# Patient Record
Sex: Female | Born: 1980 | Race: White | Hispanic: No | Marital: Married | Smoking: Former smoker
Health system: Southern US, Community
[De-identification: ages and names within clinical notes are randomized; demographics above are authoritative.]

## PROBLEM LIST (undated history)

## (undated) DIAGNOSIS — N939 Abnormal uterine and vaginal bleeding, unspecified: Secondary | ICD-10-CM

## (undated) DIAGNOSIS — R790 Abnormal level of blood mineral: Secondary | ICD-10-CM

## (undated) DIAGNOSIS — U071 COVID-19: Secondary | ICD-10-CM

## (undated) DIAGNOSIS — A64 Unspecified sexually transmitted disease: Secondary | ICD-10-CM

## (undated) DIAGNOSIS — N83202 Unspecified ovarian cyst, left side: Secondary | ICD-10-CM

## (undated) DIAGNOSIS — K219 Gastro-esophageal reflux disease without esophagitis: Secondary | ICD-10-CM

## (undated) DIAGNOSIS — H9311 Tinnitus, right ear: Secondary | ICD-10-CM

## (undated) DIAGNOSIS — D649 Anemia, unspecified: Secondary | ICD-10-CM

## (undated) DIAGNOSIS — N809 Endometriosis, unspecified: Secondary | ICD-10-CM

## (undated) DIAGNOSIS — G43909 Migraine, unspecified, not intractable, without status migrainosus: Secondary | ICD-10-CM

## (undated) DIAGNOSIS — H9190 Unspecified hearing loss, unspecified ear: Secondary | ICD-10-CM

## (undated) DIAGNOSIS — N9489 Other specified conditions associated with female genital organs and menstrual cycle: Secondary | ICD-10-CM

## (undated) DIAGNOSIS — R42 Dizziness and giddiness: Secondary | ICD-10-CM

## (undated) DIAGNOSIS — R740 Nonspecific elevation of levels of transaminase and lactic acid dehydrogenase [LDH]: Secondary | ICD-10-CM

## (undated) HISTORY — DX: Gastro-esophageal reflux disease without esophagitis: K21.9

## (undated) HISTORY — DX: Unspecified hearing loss, unspecified ear: H91.90

## (undated) HISTORY — DX: Abnormal level of blood mineral: R79.0

## (undated) HISTORY — DX: Anemia, unspecified: D64.9

## (undated) HISTORY — DX: Unspecified sexually transmitted disease: A64

## (undated) HISTORY — DX: Nonspecific elevation of levels of transaminase and lactic acid dehydrogenase (ldh): R74.0

## (undated) MED FILL — Iron Sucrose Inj 20 MG/ML (Fe Equiv): INTRAVENOUS | Qty: 10 | Status: AC

## (undated) MED FILL — Famotidine Inj 200 MG/20ML: INTRAVENOUS | Qty: 4 | Status: AC

---
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
## 2002-04-17 ENCOUNTER — Other Ambulatory Visit: Admission: RE | Admit: 2002-04-17 | Discharge: 2002-04-17 | Payer: Self-pay | Admitting: Obstetrics and Gynecology

## 2002-08-15 HISTORY — PX: CHOLECYSTECTOMY: SHX55

## 2002-08-15 HISTORY — PX: APPENDECTOMY: SHX54

## 2003-05-16 ENCOUNTER — Other Ambulatory Visit: Admission: RE | Admit: 2003-05-16 | Discharge: 2003-05-16 | Payer: Self-pay | Admitting: Obstetrics and Gynecology

## 2004-05-03 ENCOUNTER — Other Ambulatory Visit: Admission: RE | Admit: 2004-05-03 | Discharge: 2004-05-03 | Payer: Self-pay | Admitting: Obstetrics and Gynecology

## 2005-07-11 ENCOUNTER — Other Ambulatory Visit: Admission: RE | Admit: 2005-07-11 | Discharge: 2005-07-11 | Payer: Self-pay | Admitting: Obstetrics and Gynecology

## 2009-08-15 HISTORY — PX: AUGMENTATION MAMMAPLASTY: SUR837

## 2009-09-10 ENCOUNTER — Ambulatory Visit (HOSPITAL_COMMUNITY): Admission: RE | Admit: 2009-09-10 | Discharge: 2009-09-10 | Payer: Self-pay | Admitting: Radiology

## 2010-03-28 IMAGING — US US TRANSVAGINAL NON-OB
1 series · 13 of 25 positions shown · non-contrast
Comparison: None.

CLINICAL DATA: Left lower quadrant pain.  History of left ovarian
cyst. LMP 08/17/2009

TRANSABDOMINAL AND TRANSVAGINAL ULTRASOUND OF PELVIS
TECHNIQUE: Both transabdominal and transvaginal ultrasound
examinations of the pelvis were performed including evaluation of
the uterus, ovaries, adnexal regions, and pelvic cul-de-sac.

[Series 1: us transvaginal non-ob · 58 acquisitions, 13 frames shown]
[im 1/58]
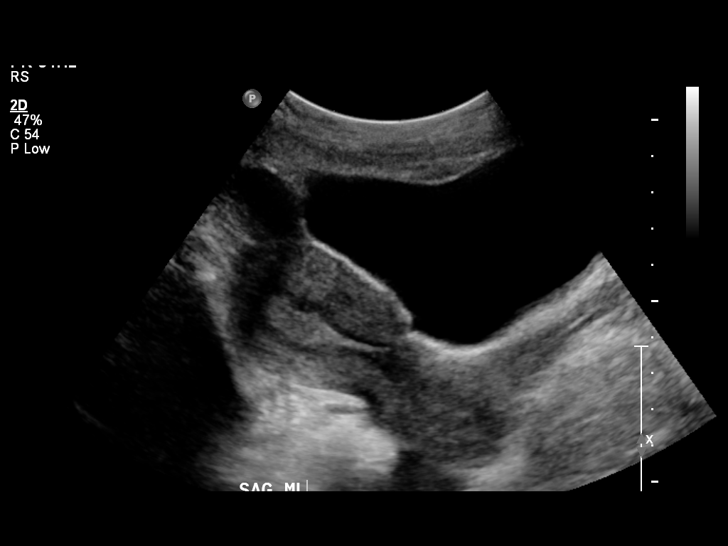
[im 5/58]
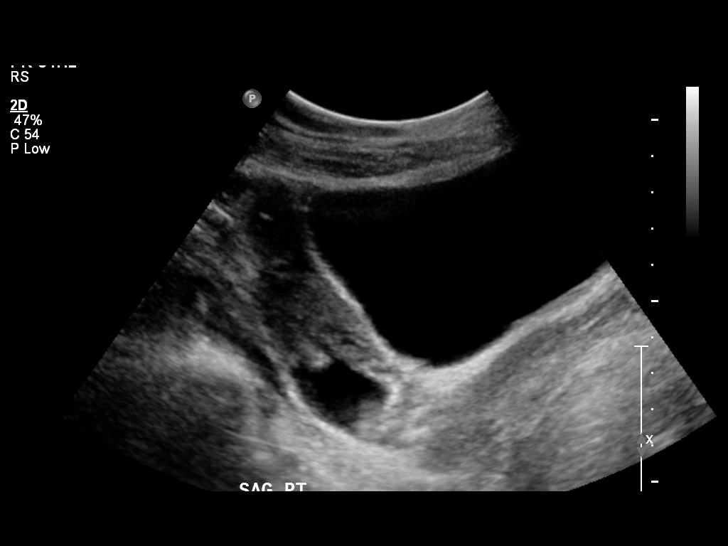
[im 10/58]
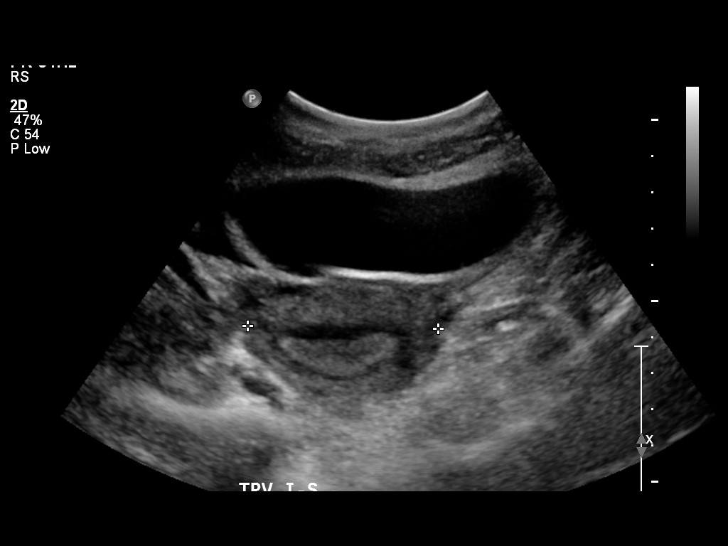
[im 15/58]
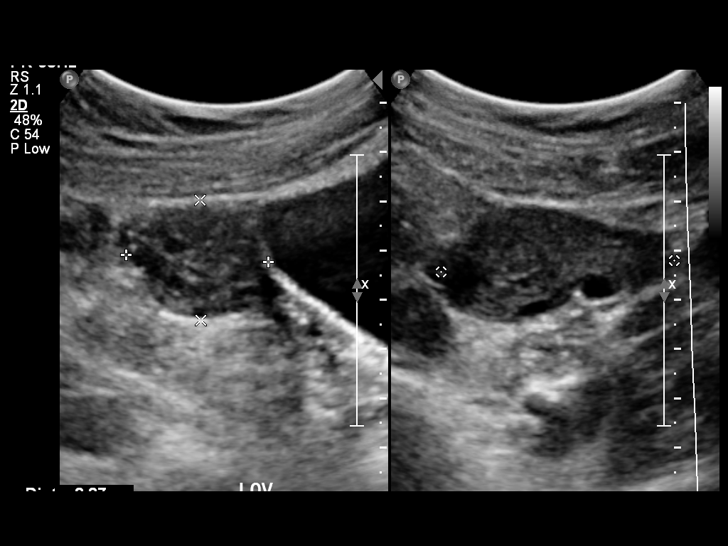
[im 20/58]
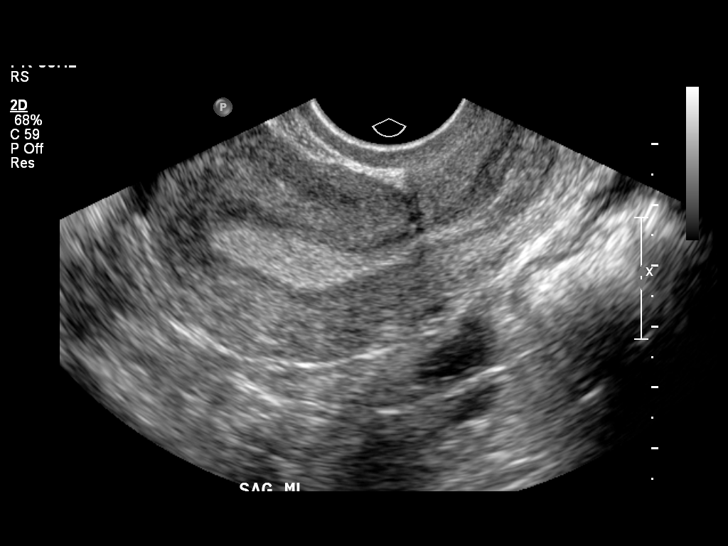
[im 24/58]
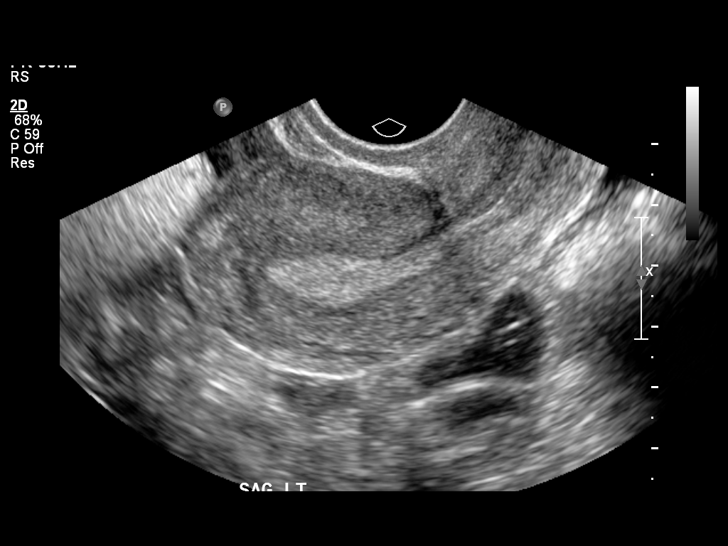
[im 29/58]
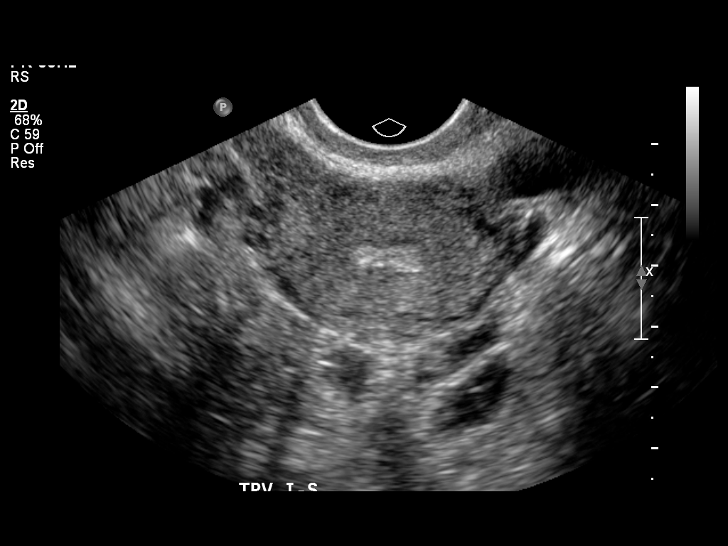
[im 34/58]
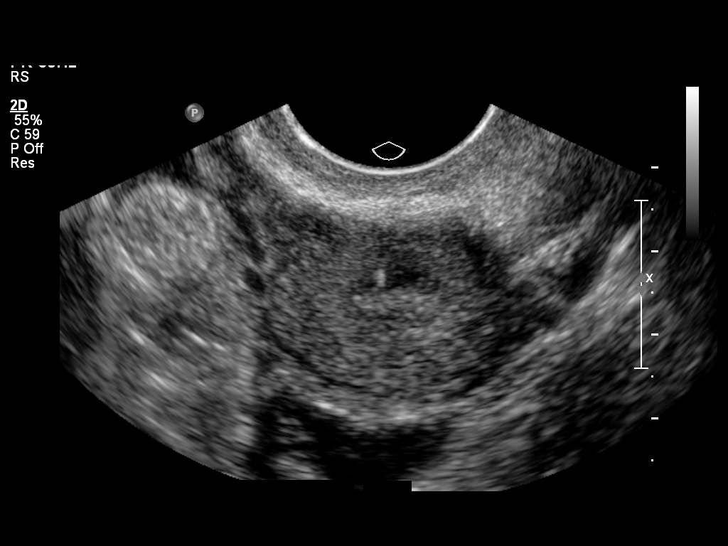
[im 39/58]
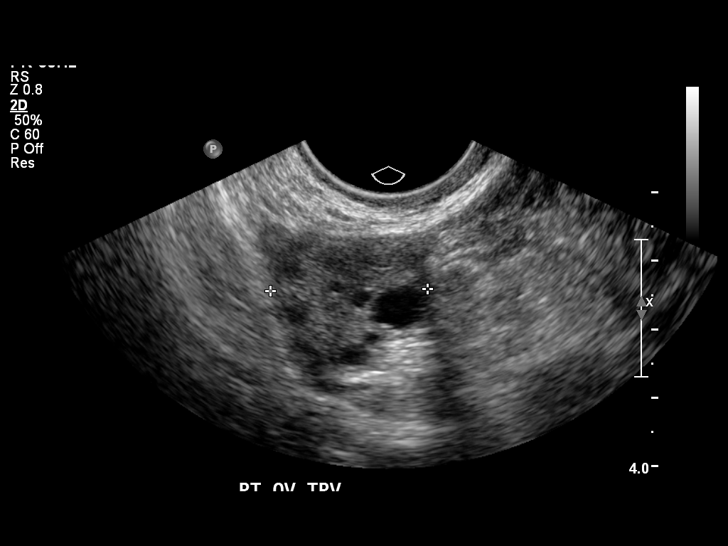
[im 43/58]
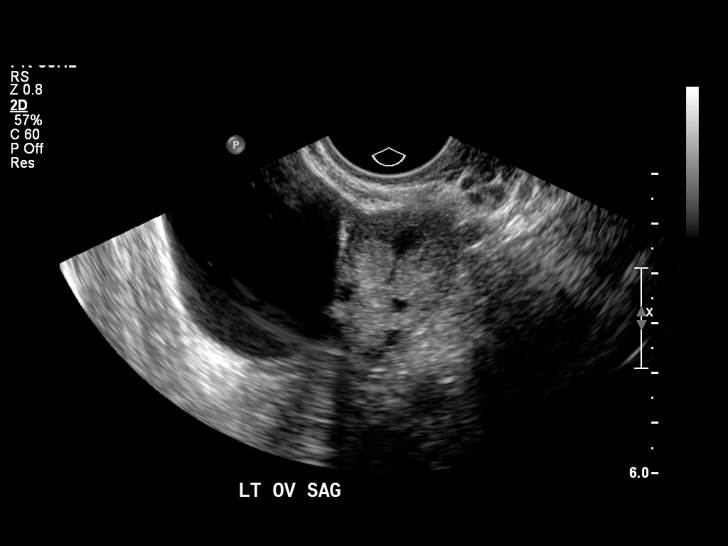
[im 48/58]
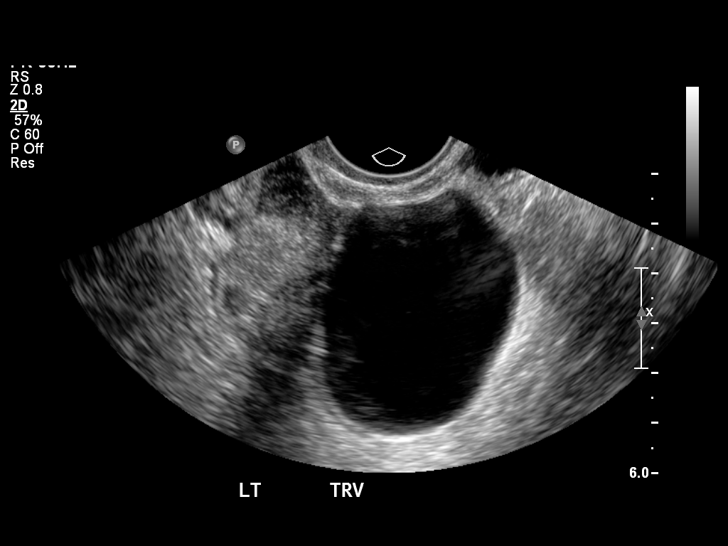
[im 53/58]
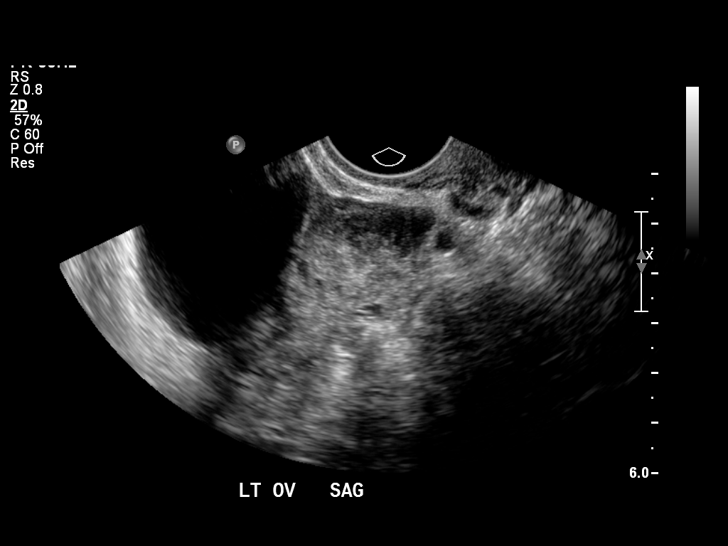
[im 58/58]
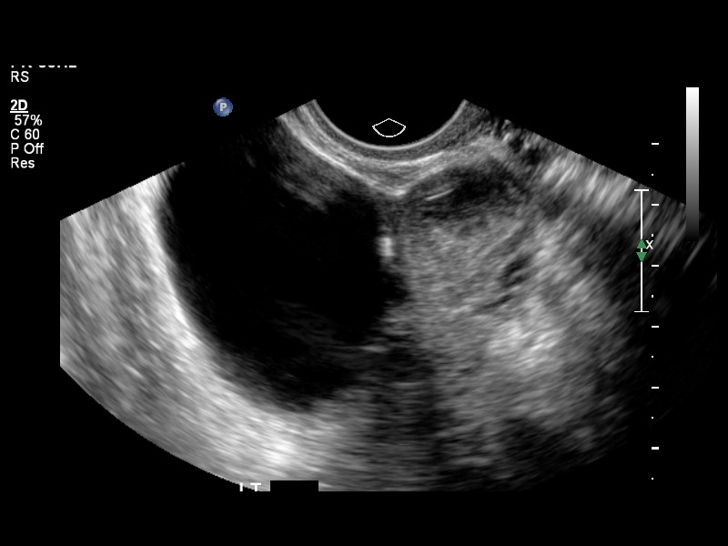

[13 of 25 positions shown; findings below may reference images not displayed]

FINDINGS: Uterus the uterus demonstrates a sagittal length of 8.8 cm, an AP
width of 3.8 cm and a transverse width of 5.2 cm.  A homogeneous
uterine myometrium is seen

Endometrium the endometrium is homogeneously echogenic with an AP
width of 9.7 mm.  No areas of focal thickening or inhomogeneity are
noted.

Right Ovary measures 2.3 x 2.8 x 2.4 cm and has a normal appearance

Left Ovary measures 4.9 x 2.5 by 2.4 cm and has a normal appearance
with a corpus luteum.  Directly adjacent to and contiguous with the
left ovary is a unilocular simple cyst which measures 5.5 x 3.9 x
4.1 cm.  This has a thin surrounding rim of tissue and is
suspicious for dilated ampullary portion of the fallopian tube
although no defining characteristics are seen today to confirm
this. A paraovarian cyst could have a similar appearance.  An
exophytic ovarian cyst is felt less likely given the appearance. A
nongynecologic etiology such as a seroma, lymphocele could be
considered in the appropriate setting.

Other Findings:  A small amount of simple free fluid is noted in
the cul-de-sac
IMPRESSION: Normal postsecretory myometrium, endometrium and ovaries.
Unilocular simple cyst noted adjacent to and contiguous with but
felt most likely to be separate from the left ovary.  This has an
appearance suspicious for a focal ampullary  hydrosalpinx due to
the closeness of the association with the ovary.  A paraovarian
cyst would be a less likely consideration as would an exophytic
functional ovarian cyst.  This can be reassessed in 6 weeks and if
this is unchanged, a functional cyst would be unlikely.  If
clinical concern warrants further evaluation with MRI may be
helpful to assess for the presence of a hydrosalpinx on this side.

## 2010-09-05 ENCOUNTER — Encounter: Payer: Self-pay | Admitting: Obstetrics and Gynecology

## 2010-12-31 NOTE — Op Note (Signed)
[facility]  Patient:    Ann Whitehead, Ann Whitehead Visit Number: [id number] MRN: [id number]          Service Type: OBS Location: 910A 9148 01 Attending Physician:  Ann Whitehead Dictated by:   Ann Whitehead Ann Whitehead, M.D. Proc. Date: 11/16/01 Admit Date:  11/16/2001                             Operative Report  PREOPERATIVE DIAGNOSES:       1. Intrauterine gestation at 39+5 weeks.                               2. Breech presentation.                               3. Oligohydramnios.  POSTOPERATIVE DIAGNOSES:      1. Intrauterine gestation at 39+5 weeks.                               2. Breech presentation.                               3. Oligohydramnios.  OPERATION:                    Primary low segment transverse cesarean section.  SURGEON:                      Ann Whitehead, M.D.  ASSISTANT:                    Ann Whitehead, M.D.  ANESTHESIA:                   Spinal, local with 20 cc of                               1% lidocaine.  IV FLUIDS:                    3000 cc lactated Ringers.  ESTIMATED BLOOD LOSS:         700 cc.  URINE OUTPUT:                 650 cc.  COMPLICATIONS:                None.  INDICATIONS FOR PROCEDURE:    The patient was an 30 year old, gravida 1, para 0, at 39+[redacted] weeks gestation Ann Whitehead 6, 2003) who presented with breech presentation and oligohydramnios which was noted at a routine office visit on Ann Whitehead 3, 2003.  The amniotic fluid index was noted to be 3.0.  A nonstress test on Ann Whitehead 3, 2003 was reactive and without decelerations.  A discussion was held with the patient regarding her diagnosis and a decision was made to proceed with a primary low segment transverse cesarean section after the risks, benefits, and alternatives were discussed with her.  FINDINGS:                     A viable female was delivered at 8:09 on Ann Whitehead 4, 2003, with Apgars of 8 at one minute  and 9 at five minutes.  A complete breech presentation  was appreciated.  The amniotic fluid was clear.  The placenta had a normal insertion with a three-vessel cord.  The uterus, tubes, and ovaries were normal.  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  SPECIMENS:                    None.  DESCRIPTION OF PROCEDURE:     With an IV in place, the patient was taken to the operating room after she was properly identified.  The patient received a spinal anesthetic and then was placed in the supine position.  The abdomen was sterilely prepped and a Foley catheter was sterilely placed inside the bladder.  She was then sterilely draped.  An Allis test was performed.  The patient was noted to appreciate some slight sensation along the line of the proposed Pfannenstiel incision.  The anesthesiologist recommended injection with local 1% lidocaine.  Ten cc were injected in the skin and subcutaneous tissue.  Surgery proceeded with Pfannenstiel incision which was then created sharply with a scalpel.  This was carried down to the fascia using a combination of sharp dissection with the scalpel and with monopolar cautery.  The fascia was then squirted in the midline and the fascial incision was carried out bilaterally with Mayo scissors.  The patient continued to have a little bit of discomfort and the fascia was, therefore, injected both superiorly and inferiorly with 1% lidocaine.  The fascia was then dissected from the rectus muscle sharply with a Mayo scissors.  The rectus muscles were divided sharply using the same.  The parietal peritoneum was entered sharply with the Metzenbaum scissors after it was grasped and elevated with two hemostats.  The incision of the peritoneum was extended cranially and caudally using the same Metzenbaum scissors.  A bladder flap was next created sharply after the bladder retractor was used to expose the lower uterine segment.  A transverse incision was then created along the uterus with a scalpel.  This  was carried down to the membranes at which time the uterine incision was extended bluntly. An Allis clamp was used to rupture the membranes and clear fluid was appreciated.  At this time a complete breech presentation was appreciated. The right leg was delivered followed by the left leg.  The torso was then gently rotated and each of the arms were swept across the chest and delivered.  The vertex was then delivered in the flexed position.  The nares and mouth were suctioned and the cord was doubly clamped and cut and the newborn was carried over to the awaiting pediatricians.  Cord blood was collected at this time.  The placenta was then manually extracted and any remaining membranes were removed from within the uterine cavity which was wiped clean with a moistened lap pad.  The uterine incision was then closed with a running locked suture of #1 chromic.  The uterine cavity was next irrigated with crystalloid solution and the incision was noted to be hemostatic.  The peritoneum was closed at this time with a running suture of 3-0 plain. The rectus muscles were brought together in the midline with a figure-of-eight and then an interrupted suture of #1 chromic.  The fascia was closed with a running suture of 0 Vicryl.  The subcutaneous tissue was irrigated and small bleeding vessels were cauterized with monopolar cautery for good hemostasis. The skin was then closed with staples and a  sterile bandage was applied.  The uterus was expressed over the remaining clots.  The patient was cleansed with Betadine.  She was escorted to the recovery room in stable and awaken condition.  There were no complications to the procedure.  All needle counts, instrument counts and sponge counts were correct.    INDICATIONS:  DESCRIPTION OF PROCEDURE: Dictated by:   Ann Whitehead. Ann Whitehead, M.D. Attending Physician:  Ann Whitehead DD:  11/16/01 TD:  11/17/01 Job: [id number]

## 2010-12-31 NOTE — Discharge Summary (Signed)
[facility]  Patient:    DENAYA, HORN Visit Number: [id number] MRN: [id number]          Service Type: OBS Location: 910A 9148 01 Attending Physician:  Melony Overly Dictated by:   Leilani Able, P.A. Admit Date:  11/16/2001 Discharge Date: 11/18/2001                             Discharge Summary  FINAL DIAGNOSES:              1. Intrauterine pregnancy at 39-5/[redacted] weeks                                  gestation.                               2. Breech presentation.                               3. Oligohydramnios.  PROCEDURE:                    Primary low segment transverse cesarean section.  SURGEON:                      1. Brook A. Edward Jolly, M.D.                               2. Miguel Aschoff, M.D.  COMPLICATIONS:                None.  HOSPITAL COURSE:              This 30 year old G1, P0, presented 39-5/[redacted] weeks gestation with a breech presentation and oligohydramnios.  This was noted at a routine office visit on [place] 3, 2003.  AFI was noted to be 3.0.  A nonstress test was performed on the 3rd which was reactive without deceleration.  A discussion was made with the patient regarding her diagnosis.  The decision was made to proceed with a cesarean section.  The patient was taken to the operating room on [place] 4, 2003, by Dr. Conley Simmonds, where a primary low segment transverse cesarean section was performed with the delivery of a 7 pound 10 ounce female infant with Apgars of 8 and 9.  Delivery went without complications.  The patients postoperative course was benign without significant fevers.  The patient ready for discharge on postoperative day #2, was sent home on a regular diet and told decrease activity.  DISCHARGE MEDICATIONS:        Tylox 1-2 q.4h. p.r.n. pain.  FOLLOW-UP:                    Follow up in the office in four weeks. Dictated by:   Leilani Able, P.A. Attending Physician:  Melony Overly DD:  12/03/01 TD:   12/04/01 Job: [id number]

## 2013-10-04 ENCOUNTER — Encounter: Payer: Self-pay | Admitting: Obstetrics and Gynecology

## 2013-10-04 ENCOUNTER — Ambulatory Visit (INDEPENDENT_AMBULATORY_CARE_PROVIDER_SITE_OTHER): Payer: 59 | Admitting: Obstetrics and Gynecology

## 2013-10-04 VITALS — BP 118/76 | HR 70 | Resp 16 | Ht 64.0 in | Wt 148.5 lb

## 2013-10-04 DIAGNOSIS — Z01419 Encounter for gynecological examination (general) (routine) without abnormal findings: Secondary | ICD-10-CM

## 2013-10-04 DIAGNOSIS — Z0000 Encounter for general adult medical examination without abnormal findings: Secondary | ICD-10-CM

## 2013-10-04 LAB — POCT URINALYSIS DIPSTICK
BILIRUBIN UA: NEGATIVE
Glucose, UA: NEGATIVE
KETONES UA: NEGATIVE
Leukocytes, UA: NEGATIVE
Nitrite, UA: NEGATIVE
PH UA: 5
Protein, UA: NEGATIVE
RBC UA: NEGATIVE
Urobilinogen, UA: NEGATIVE

## 2013-10-04 LAB — HEMOGLOBIN, FINGERSTICK: HEMOGLOBIN, FINGERSTICK: 14.2 g/dL (ref 12.0–16.0)

## 2013-10-04 MED ORDER — NORETHIN ACE-ETH ESTRAD-FE 1-20 MG-MCG PO TABS: 1.0000 | ORAL_TABLET | Freq: Every day | ORAL | Status: DC

## 2013-10-04 NOTE — Patient Instructions (Signed)
Levonorgestrel intrauterine device (IUD) What is this medicine? LEVONORGESTREL IUD (LEE voe nor jes trel) is a contraceptive (birth control) device. The device is placed inside the uterus by a healthcare professional. It is used to prevent pregnancy and can also be used to treat heavy bleeding that occurs during your period. Depending on the device, it can be used for 3 to 5 years. This medicine may be used for other purposes; ask your health care provider or pharmacist if you have questions. COMMON BRAND NAME(S): Mirena, Skyla What should I tell my health care provider before I take this medicine? They need to know if you have any of these conditions: -abnormal Pap smear -cancer of the breast, uterus, or cervix -diabetes -endometritis -genital or pelvic infection now or in the past -have more than one sexual partner or your partner has more than one partner -heart disease -history of an ectopic or tubal pregnancy -immune system problems -IUD in place -liver disease or tumor -problems with blood clots or take blood-thinners -use intravenous drugs -uterus of unusual shape -vaginal bleeding that has not been explained -an unusual or allergic reaction to levonorgestrel, other hormones, silicone, or polyethylene, medicines, foods, dyes, or preservatives -pregnant or trying to get pregnant -breast-feeding How should I use this medicine? This device is placed inside the uterus by a health care professional. Talk to your pediatrician regarding the use of this medicine in children. Special care may be needed. Overdosage: If you think you have taken too much of this medicine contact a poison control center or emergency room at once. NOTE: This medicine is only for you. Do not share this medicine with others. What if I miss a dose? This does not apply. What may interact with this medicine? Do not take this medicine with any of the following  medications: -amprenavir -bosentan -fosamprenavir This medicine may also interact with the following medications: -aprepitant -barbiturate medicines for inducing sleep or treating seizures -bexarotene -griseofulvin -medicines to treat seizures like carbamazepine, ethotoin, felbamate, oxcarbazepine, phenytoin, topiramate -modafinil -pioglitazone -rifabutin -rifampin -rifapentine -some medicines to treat HIV infection like atazanavir, indinavir, lopinavir, nelfinavir, tipranavir, ritonavir -St. John's wort -warfarin This list may not describe all possible interactions. Give your health care provider a list of all the medicines, herbs, non-prescription drugs, or dietary supplements you use. Also tell them if you smoke, drink alcohol, or use illegal drugs. Some items may interact with your medicine. What should I watch for while using this medicine? Visit your doctor or health care professional for regular check ups. See your doctor if you or your partner has sexual contact with others, becomes HIV positive, or gets a sexual transmitted disease. This product does not protect you against HIV infection (AIDS) or other sexually transmitted diseases. You can check the placement of the IUD yourself by reaching up to the top of your vagina with clean fingers to feel the threads. Do not pull on the threads. It is a good habit to check placement after each menstrual period. Call your doctor right away if you feel more of the IUD than just the threads or if you cannot feel the threads at all. The IUD may come out by itself. You may become pregnant if the device comes out. If you notice that the IUD has come out use a backup birth control method like condoms and call your health care provider. Using tampons will not change the position of the IUD and are okay to use during your period. What side effects may I   notice from receiving this medicine? Side effects that you should report to your doctor or  health care professional as soon as possible: -allergic reactions like skin rash, itching or hives, swelling of the face, lips, or tongue -fever, flu-like symptoms -genital sores -high blood pressure -no menstrual period for 6 weeks during use -pain, swelling, warmth in the leg -pelvic pain or tenderness -severe or sudden headache -signs of pregnancy -stomach cramping -sudden shortness of breath -trouble with balance, talking, or walking -unusual vaginal bleeding, discharge -yellowing of the eyes or skin Side effects that usually do not require medical attention (report to your doctor or health care professional if they continue or are bothersome): -acne -breast pain -change in sex drive or performance -changes in weight -cramping, dizziness, or faintness while the device is being inserted -headache -irregular menstrual bleeding within first 3 to 6 months of use -nausea This list may not describe all possible side effects. Call your doctor for medical advice about side effects. You may report side effects to FDA at 1-800-FDA-1088. Where should I keep my medicine? This does not apply. NOTE: This sheet is a summary. It may not cover all possible information. If you have questions about this medicine, talk to your doctor, pharmacist, or health care provider.  2014, Elsevier/Gold Standard. (2011-09-01 13:54:04)  Laparoscopic Tubal Ligation Laparoscopic tubal ligation is a procedure that closes the fallopian tubes at a time other than right after childbirth. By closing the fallopian tubes, the eggs that are released from the ovaries cannot enter the uterus and sperm cannot reach the egg. Tubal ligation is also known as getting your "tubes tied." Tubal ligation is done so you will not be able to get pregnant or have a baby.  Although this procedure may be reversed, it should be considered permanent and irreversible. If you want to have future pregnancies, you should not have this  procedure.  LET YOUR CAREGIVER KNOW ABOUT:  Allergies to food or medicine.  Medicines taken, including vitamins, herbs, eyedrops, over-the-counter medicines, and creams.  Use of steroids (by mouth or creams).  Previous problems with numbing medicines.  History of bleeding problems or blood clots.  Any recent colds or infections.  Previous surgery.  Other health problems, including diabetes and kidney problems.  Possibility of pregnancy, if this applies.  Any past pregnancies. RISKS AND COMPLICATIONS   Infection.  Bleeding.  Injury to surrounding organs.  Anesthetic side effects.  Failure of the procedure.  Ectopic pregnancy.  Future regret about having the procedure done. BEFORE THE PROCEDURE  Do not take aspirin or blood thinners a week before the procedure or as directed. This can cause bleeding.  Do not eat or drink anything 6 to 8 hours before the procedure. PROCEDURE   You may be given a medicine to help you relax (sedative) before the procedure. You will be given a medicine to make you sleep (general anesthetic) during the procedure.  A tube will be put down your throat to help your breath while under general anesthesia.  Two small cuts (incisions) are made in the lower abdominal area and near the belly button.  Your abdominal area will be inflated with a safe gas (carbon dioxide). This helps give the surgeon room to operate, visualize, and helps the surgeon avoid other organs.  A thin, lighted tube (laparoscope) with a camera attached is inserted into your abdomen through one of the incisions near the belly button. Other small instruments are also inserted through the other abdominal incision.  The  fallopian tubes are located and are either blocked with a ring, clip, or are burned (cauterized).  After the fallopian tubes are blocked, the gas is released from the abdomen.  The incisions will be closed with stitches (sutures), and a bandage may be placed  over the incisions. AFTER THE PROCEDURE   You will rest in a recovery room for 1 4 hours until you are stable and doing well.  You will also have some mild abdominal discomfort for 3 7 days. You will be given pain medicine to ease any discomfort.  As long as there are no problems, you may be allowed to go home. Someone will need to drive you home and be with you for at least 24 hours once home.  You may have some mild discomfort in the throat. This is from the tube placed in your throat while you were sleeping.  You may experience discomfort in the shoulder area from some trapped air between the liver and diaphragm. This sensation is normal and will slowly go away on its own. Document Released: 11/07/2000 Document Revised: 01/31/2012 Document Reviewed: 11/12/2011 [facility] Patient Information 2014 [place], [place].  The name of the vaginal tubal ligation is called Essure.  EXERCISE AND DIET:  We recommended that you start or continue a regular exercise program for good health. Regular exercise means any activity that makes your heart beat faster and makes you sweat.  We recommend exercising at least 30 minutes per day at least 3 days a week, preferably 4 or 5.  We also recommend a diet low in fat and sugar.  Inactivity, poor dietary choices and obesity can cause diabetes, heart attack, stroke, and kidney damage, among others.    ALCOHOL AND SMOKING:  Women should limit their alcohol intake to no more than 7 drinks/beers/glasses of wine (combined, not each!) per week. Moderation of alcohol intake to this level decreases your risk of breast cancer and liver damage. And of course, no recreational drugs are part of a healthy lifestyle.  And absolutely no smoking or even second hand smoke. Most people know smoking can cause heart and lung diseases, but did you know it also contributes to weakening of your bones? Aging of your skin?  Yellowing of your teeth and nails?  CALCIUM AND VITAMIN D:  Adequate  intake of calcium and Vitamin D are recommended.  The recommendations for exact amounts of these supplements seem to change often, but generally speaking 600 mg of calcium (either carbonate or citrate) and 800 units of Vitamin D per day seems prudent. Certain women may benefit from higher intake of Vitamin D.  If you are among these women, your doctor will have told you during your visit.    PAP SMEARS:  Pap smears, to check for cervical cancer or precancers,  have traditionally been done yearly, although recent scientific advances have shown that most women can have pap smears less often.  However, every woman still should have a physical exam from her gynecologist every year. It will include a breast check, inspection of the vulva and vagina to check for abnormal growths or skin changes, a visual exam of the cervix, and then an exam to evaluate the size and shape of the uterus and ovaries.  And after 31 years of age, a rectal exam is indicated to check for rectal cancers. We will also provide age appropriate advice regarding health maintenance, like when you should have certain vaccines, screening for sexually transmitted diseases, bone density testing, colonoscopy, mammograms, etc.  MAMMOGRAMS:  All women over 31 years old should have a yearly mammogram. Many facilities now offer a "3D" mammogram, which may cost around $50 extra out of pocket. If possible,  we recommend you accept the option to have the 3D mammogram performed.  It both reduces the number of women who will be called back for extra views which then turn out to be normal, and it is better than the routine mammogram at detecting truly abnormal areas.    COLONOSCOPY:  Colonoscopy to screen for colon cancer is recommended for all women at age 31.  We know, you hate the idea of the prep.  We agree, BUT, having colon cancer and not knowing it is worse!!  Colon cancer so often starts as a polyp that can be seen and removed at colonscopy, which can  quite literally save your life!  And if your first colonoscopy is normal and you have no family history of colon cancer, most women don't have to have it again for 10 years.  Once every ten years, you can do something that may end up saving your life, right?  We will be happy to help you get it scheduled when you are ready.  Be sure to check your insurance coverage so you understand how much it will cost.  It may be covered as a preventative service at no cost, but you should check your particular policy.

## 2013-10-04 NOTE — Progress Notes (Signed)
Patient ID: Ann Whitehead, female   DOB: May 11, 1981, 33 y.o.   MRN: [id number] GYNECOLOGY VISIT  PCP:   Keturah Barreobert Robbins, MD  Referring provider:   HPI: 33 y.o.   Married  Caucasian  female   G1P1001 with Patient's last menstrual period was 09/28/2013.   here for  AEX.  Considering tubal ligation.  Wanted another pregnancy but unable to do so. Trying for 5 years.  Not interested in fertility assistance.   Menstruation every 41 days.  Last for 6 days.  Pad change every one hour at max for first day. Clotting. Lower back pain with ovulation and then for first day of cycle.  Takes Aleve which decreases but does not resolve cramping.  No reduction in back pain.   Nuvaring caused dryness. Did OK on birth control pills.  Nausea with higher dosage OCPs.  Hgb:    14.2 Urine:  Neg  GYNECOLOGIC HISTORY: Patient's last menstrual period was 09/28/2013. Sexually active:  yes Partner preference: female Contraception:   none Menopausal hormone therapy: n/a DES exposure:   no Blood transfusions:   no Sexually transmitted diseases:   History of HSV. GYN procedures and prior surgeries:  C-section for breech.  No dilation. Last mammogram:    n/a             Last pap and high risk HPV testing:   18 months ago:wnl. History of abnormal pap smear:  no   OB History   Grav Para Term Preterm Abortions TAB SAB Ect Mult Living   1 1 1       1        LIFESTYLE: Exercise:   Weights/cardio            Tobacco:   no Alcohol:      3-4 mixed drinks per week. Drug use:  no  OTHER HEALTH MAINTENANCE: Tetanus/TDap:    2004 Gardisil:               no Influenza:             no Zostavax:             n/a  Bone density:       n/a Colonoscopy:        n/a  Cholesterol check:    wnl:2013   Family History  Problem Relation Age of Onset  . Colon polyps Mother     multiple pre-cancerous polyps on several colonoscopies  . Diabetes Father   . Hypertension Father   . Hyperlipidemia Father   .  Diabetes Paternal Grandmother   . Hyperlipidemia Paternal Grandmother   . Hypertension Paternal Grandmother     There are no active problems to display for this patient.  Past Medical History  Diagnosis Date  . STD (sexually transmitted disease)     HX HSV    Past Surgical History  Procedure Laterality Date  . Cholecystectomy  2004  . Appendectomy  2004  . Cesarean section  2003  . Augmentation mammaplasty  2011    ALLERGIES: Review of patient's allergies indicates no known allergies.  Current Outpatient Prescriptions  Medication Sig Dispense Refill  . acyclovir (ZOVIRAX) 200 MG capsule Take 200 mg by mouth as needed.      . cetirizine (ZYRTEC) 10 MG tablet Take 10 mg by mouth daily.      . predniSONE (DELTASONE) 20 MG tablet Take 20 mg by mouth daily with breakfast. Takind 20mg  daily x 2 days and then stop  No current facility-administered medications for this visit.     ROS:  Pertinent items are noted in HPI.  SOCIAL HISTORY:  Married. Dental assistant.   PHYSICAL EXAMINATION:    BP 118/76  Pulse 70  Resp 16  Ht 5\' 4"  (1.626 m)  Wt 148 lb 8 oz (67.359 kg)  BMI 25.48 kg/m2  LMP 09/28/2013   Wt Readings from Last 3 Encounters:  10/04/13 148 lb 8 oz (67.359 kg)     Ht Readings from Last 3 Encounters:  10/04/13 5\' 4"  (1.626 m)    General appearance: alert, cooperative and appears stated age Head: Normocephalic, without obvious abnormality, atraumatic Neck: no adenopathy, supple, symmetrical, trachea midline and thyroid not enlarged, symmetric, no tenderness/mass/nodules Lungs: clear to auscultation bilaterally Breasts:Bilateral implants with scarring from reduction and placement of implants, No nipple retraction or dimpling, No nipple discharge or bleeding, No axillary or supraclavicular adenopathy, Normal to palpation without dominant masses Heart: regular rate and rhythm Abdomen: soft, non-tender; no masses,  no organomegaly Extremities: extremities  normal, atraumatic, no cyanosis or edema Skin: Skin color, texture, turgor normal. No rashes or lesions Lymph nodes: Cervical, supraclavicular, and axillary nodes normal. No abnormal inguinal nodes palpated Neurologic: Grossly normal  Pelvic: External genitalia:  no lesions              Urethra:  normal appearing urethra with no masses, tenderness or lesions              Bartholins and Skenes: normal                 Vagina: normal appearing vagina with normal color and discharge, no lesions              Cervix: normal appearance, nulliparous appearing.               Pap and high risk HPV testing done: yes.            Bimanual Exam:  Uterus:  uterus is normal size, shape, consistency and nontender                                      Adnexa: normal adnexa in size, nontender and no masses                                      Rectovaginal: Confirms                                      Anus:  normal sphincter tone, no lesions  ASSESSMENT  Normal gynecologic exam. Menorrhagia.  Dysmenorrhea. Considering permanent sterilization.    PLAN  Mammogram starting age 6.  Pap smear and high risk HPV testing today.  Counseled on self breast exam. Discussion about contraceptive options - reversible and permanent.  Rx for LoEstrin 3 packs with 3 refills.  Will take continuously.   Discussed side effects and risks of thromboembolic events.  Information about Mirena IUD and tubal ligation.  Return annually or prn   An After Visit Summary was printed and given to the patient.

## 2013-10-07 ENCOUNTER — Telehealth: Payer: Self-pay | Admitting: Obstetrics and Gynecology

## 2013-10-07 NOTE — Telephone Encounter (Signed)
Pt wanting to find out how much she would be responsible for if she decides to have her tubes tied.

## 2013-10-09 NOTE — Telephone Encounter (Signed)
Pt calling again regarding earlier message.

## 2013-10-10 LAB — IPS PAP TEST WITH HPV

## 2013-10-16 NOTE — Telephone Encounter (Signed)
Advised patient that the cost of this procedure would vary depending on her health plan benefits and the specific surgery being rendered. Gave cost of [phone] $880.46 [facility].

## 2014-01-21 ENCOUNTER — Telehealth: Payer: Self-pay | Admitting: Obstetrics and Gynecology

## 2014-01-21 NOTE — Telephone Encounter (Signed)
Patient canceled her upcoming consult "fertility" appointment 01/24/14. Patient did not wish to reschedule at this time.

## 2014-01-24 ENCOUNTER — Institutional Professional Consult (permissible substitution): Payer: 59 | Admitting: Obstetrics and Gynecology

## 2014-04-29 ENCOUNTER — Telehealth: Payer: Self-pay | Admitting: Obstetrics and Gynecology

## 2014-04-29 NOTE — Telephone Encounter (Signed)
Spoke with patient. Advised would need to discuss with pcp for this purpose.  She has pcp and will call them.  Routing to provider for final review. Patient agreeable to disposition. Will close encounter

## 2014-04-29 NOTE — Telephone Encounter (Signed)
Pt says she could call her in a medication for or anxiety if she would need to see her primary care. Pt wants something to take on daily and wont make her sleepy

## 2014-06-16 ENCOUNTER — Encounter: Payer: Self-pay | Admitting: Obstetrics and Gynecology

## 2014-10-10 ENCOUNTER — Encounter: Payer: Self-pay | Admitting: Obstetrics and Gynecology

## 2014-10-10 ENCOUNTER — Ambulatory Visit: Payer: 59 | Admitting: Obstetrics and Gynecology

## 2014-10-10 ENCOUNTER — Ambulatory Visit (INDEPENDENT_AMBULATORY_CARE_PROVIDER_SITE_OTHER): Payer: 59 | Admitting: Obstetrics and Gynecology

## 2014-10-10 VITALS — BP 110/72 | HR 80 | Resp 16 | Ht 64.0 in | Wt 171.0 lb

## 2014-10-10 DIAGNOSIS — Z113 Encounter for screening for infections with a predominantly sexual mode of transmission: Secondary | ICD-10-CM

## 2014-10-10 DIAGNOSIS — Z01419 Encounter for gynecological examination (general) (routine) without abnormal findings: Secondary | ICD-10-CM

## 2014-10-10 DIAGNOSIS — Z23 Encounter for immunization: Secondary | ICD-10-CM

## 2014-10-10 DIAGNOSIS — Z0000 Encounter for general adult medical examination without abnormal findings: Secondary | ICD-10-CM

## 2014-10-10 LAB — COMPREHENSIVE METABOLIC PANEL
ALK PHOS: 36 U/L — AB (ref 39–117)
ALT: 19 U/L (ref 0–35)
AST: 24 U/L (ref 0–37)
Albumin: 4.1 g/dL (ref 3.5–5.2)
BILIRUBIN TOTAL: 0.2 mg/dL (ref 0.2–1.2)
BUN: 7 mg/dL (ref 6–23)
CO2: 29 mEq/L (ref 19–32)
Calcium: 9.4 mg/dL (ref 8.4–10.5)
Chloride: 103 mEq/L (ref 96–112)
Creat: 0.75 mg/dL (ref 0.50–1.10)
Glucose, Bld: 77 mg/dL (ref 70–99)
POTASSIUM: 4.3 meq/L (ref 3.5–5.3)
SODIUM: 139 meq/L (ref 135–145)
Total Protein: 6.9 g/dL (ref 6.0–8.3)

## 2014-10-10 LAB — POCT URINALYSIS DIPSTICK
Bilirubin, UA: NEGATIVE
Blood, UA: NEGATIVE
Glucose, UA: NEGATIVE
KETONES UA: NEGATIVE
LEUKOCYTES UA: NEGATIVE
NITRITE UA: NEGATIVE
Protein, UA: NEGATIVE
Urobilinogen, UA: NEGATIVE
pH, UA: 6

## 2014-10-10 LAB — LIPID PANEL
CHOL/HDL RATIO: 3.1 ratio
CHOLESTEROL: 171 mg/dL (ref 0–200)
HDL: 55 mg/dL (ref >=46)
LDL Cholesterol: 100 mg/dL — ABNORMAL HIGH (ref 0–99)
TRIGLYCERIDES: 80 mg/dL (ref <150)
VLDL: 16 mg/dL (ref 0–40)

## 2014-10-10 LAB — CBC
HEMATOCRIT: 38.6 % (ref 36.0–46.0)
HEMOGLOBIN: 12.6 g/dL (ref 12.0–15.0)
MCH: 30.6 pg (ref 26.0–34.0)
MCHC: 32.6 g/dL (ref 30.0–36.0)
MCV: 93.7 fL (ref 78.0–100.0)
MPV: 9.2 fL (ref 8.6–12.4)
Platelets: 322 10*3/uL (ref 150–400)
RBC: 4.12 MIL/uL (ref 3.87–5.11)
RDW: 13.2 % (ref 11.5–15.5)
WBC: 7.4 10*3/uL (ref 4.0–10.5)

## 2014-10-10 LAB — TSH: TSH: 2.34 u[IU]/mL (ref 0.350–4.500)

## 2014-10-10 MED ORDER — ACYCLOVIR 400 MG PO TABS: 400.0000 mg | ORAL_TABLET | Freq: Three times a day (TID) | ORAL | Status: DC

## 2014-10-10 NOTE — Progress Notes (Signed)
Patient ID: Ann Whitehead, female   DOB: 11/13/1980, 34 y.o.   MRN: [id number] 34 y.o. G1P1001 MarriedCaucasianF here for annual exam.   PCP:  Gerre PebblesSally Davis, PA-C  Contraception - none.  Last year received Rx for OCPs but did not take.  Would like pregnancy if it happened.   Menses are regular.  Can feel ovulation.   Uncertain if has right breast lump.  Random palpitations.  One caffeine per day.   Husband had sexual relationship outside of their marriage.  He had STD testing which was negative.  Did counseling.   Weaning off of Lexapro.  Had weight gain. Feels like she can come off.  Wants Acyclovir.   Patient's last menstrual period was 09/24/2014 (exact date).          Sexually active: Yes.  female partner  The current method of family planning is none.    Exercising: Yes.    walking. Smoker:  no  Health Maintenance: Pap:  10-04-13 wnl:neg HR HPV History of abnormal Pap:  no MMG:  n/a Colonoscopy:  n/a BMD:   n/a TDaP:  2004 Screening Labs:   Urine today: Neg   reports that she has never smoked. She does not have any smokeless tobacco history on file. She reports that she drinks about 1.8 oz of alcohol per week. She reports that she does not use illicit drugs.  Past Medical History  Diagnosis Date  . STD (sexually transmitted disease)     HX HSV    Past Surgical History  Procedure Laterality Date  . Cholecystectomy  2004  . Appendectomy  2004  . Cesarean section  2003  . Augmentation mammaplasty  2011    Current Outpatient Prescriptions  Medication Sig Dispense Refill  . acyclovir (ZOVIRAX) 200 MG capsule Take 200 mg by mouth as needed.    . cetirizine (ZYRTEC) 10 MG tablet Take 10 mg by mouth daily.    . cyclobenzaprine (FLEXERIL) 10 MG tablet Take 10 mg by mouth as needed for muscle spasms.    Marland Kitchen. escitalopram (LEXAPRO) 10 MG tablet Take 10 mg by mouth daily.     No current facility-administered medications for this visit.    Family History   Problem Relation Age of Onset  . Colon polyps Mother     multiple pre-cancerous polyps on several colonoscopies  . Diabetes Father   . Hypertension Father   . Hyperlipidemia Father   . Diabetes Paternal Grandmother   . Hyperlipidemia Paternal Grandmother   . Hypertension Paternal Grandmother     ROS:  Pertinent items are noted in HPI.  Otherwise, a comprehensive ROS was negative.  Exam:   BP 110/72 mmHg  Pulse 80  Resp 16  Ht 5\' 4"  (1.626 m)  Wt 171 lb (77.565 kg)  BMI 29.34 kg/m2  LMP 09/24/2014 (Exact Date)     Height: 5\' 4"  (162.6 cm)  Ht Readings from Last 3 Encounters:  10/10/14 5\' 4"  (1.626 m)  10/04/13 5\' 4"  (1.626 m)    General appearance: alert, cooperative and appears stated age Head: Normocephalic, without obvious abnormality, atraumatic Neck: no adenopathy, supple, symmetrical, trachea midline and thyroid normal to inspection and palpation Lungs: clear to auscultation bilaterally Breasts: normal appearance, no masses or tenderness, Inspection negative, No nipple retraction or dimpling, No nipple discharge or bleeding, No axillary or supraclavicular adenopathy.  Exam consistent with bilateral implants.  No dominant masses or either breast. Heart: regular rate and rhythm Abdomen: soft, non-tender; bowel sounds normal; no masses,  no organomegaly Extremities: extremities normal, atraumatic, no cyanosis or edema Skin: Skin color, texture, turgor normal. No rashes or lesions Lymph nodes: Cervical, supraclavicular, and axillary nodes normal. No abnormal inguinal nodes palpated Neurologic: Grossly normal   Pelvic: External genitalia:  no lesions              Urethra:  normal appearing urethra with no masses, tenderness or lesions              Bartholins and Skenes: normal                 Vagina: normal appearing vagina with normal color and discharge, no lesions              Cervix: no lesions              Pap taken: No. Bimanual Exam:  Uterus:  normal size,  contour, position, consistency, mobility, non-tender              Adnexa: normal adnexa and no mass, fullness, tenderness               Rectovaginal: Confirms               Anus:  normal sphincter tone, no lesions  Chaperone was present for exam.  A:  Well Woman with normal exam Need for STD screening.  History of HSV.  Desire for future pregnancy.  Random palpitations.   P:   pap smear next year.  Do self breast exam.  STD screening.  Rx for Acyclovir.  See orders. Routine labs.  TDap.  Discussed condom use until weaned off Lexapro.  Start PNV. Discussed caffeine use.  To PCP if palpitations persist or increase. return annually or prn

## 2014-10-10 NOTE — Patient Instructions (Signed)

## 2014-10-11 LAB — STD PANEL
HIV 1&2 Ab, 4th Generation: NONREACTIVE
Hepatitis B Surface Ag: NEGATIVE

## 2014-10-11 LAB — HEPATITIS C ANTIBODY: HCV Ab: NEGATIVE

## 2014-10-11 LAB — GC/CHLAMYDIA PROBE AMP, URINE
Chlamydia, Swab/Urine, PCR: NEGATIVE
GC PROBE AMP, URINE: NEGATIVE

## 2015-06-25 ENCOUNTER — Telehealth: Payer: Self-pay | Admitting: Obstetrics and Gynecology

## 2015-06-25 NOTE — Telephone Encounter (Signed)
Patient wants to talk with the nurse.. She has some questions regarding tubal ligation. She can be reached at work [phone] and between 1-2 at [phone]

## 2015-06-25 NOTE — Telephone Encounter (Signed)
Spoke with patient. Patient states that she spoke with her insurance company and having a tubal ligation is considered "preventative" and will be covered with her insurance plan. Patient states she has spoken with Dr.Silva regarding this the last two years and would like to pursue this before the end of the year as her insurance is likely going to change. Patient would like to move forward with scheduling at this time.  Dr.Silva, please review and advise. Would you like patient to have a consultation before scheduling? Or okay to move forward with scheduling and then schedule pre-op consult?

## 2015-06-25 NOTE — Telephone Encounter (Signed)
Needs an office visit with me before scheduling the tubal ligation.  I have not seen the patient since February 2016.  I am happy to help!

## 2015-06-26 NOTE — Telephone Encounter (Signed)
Called patient and scheduled office visit with Dr. Edward JollySilva 07/03/15 (patient choice for date). Patient wants to plan procedure prior to the end of the year. Advised can discuss at time of consult. Patient agreeable.  Routing to provider for final review. Patient agreeable to disposition. Will close encounter.

## 2015-07-03 ENCOUNTER — Encounter: Payer: Self-pay | Admitting: Obstetrics and Gynecology

## 2015-07-03 ENCOUNTER — Ambulatory Visit (INDEPENDENT_AMBULATORY_CARE_PROVIDER_SITE_OTHER): Payer: Commercial Managed Care - HMO | Admitting: Obstetrics and Gynecology

## 2015-07-03 VITALS — BP 108/76 | HR 76 | Resp 16 | Ht 64.0 in | Wt 167.6 lb

## 2015-07-03 DIAGNOSIS — Z308 Encounter for other contraceptive management: Secondary | ICD-10-CM | POA: Diagnosis not present

## 2015-07-03 NOTE — Progress Notes (Signed)
After consult with Dr Edward JollySilva, patient requested to proceed with scheduling [facility] Surgery instruction sheet reviewed with patient and printed copy given. (see copy scanned to chart.)

## 2015-07-03 NOTE — Progress Notes (Signed)
Patient ID: Ann Whitehead, female   DOB: [birth year], 34 y.o.   MRN: [id number] GYNECOLOGY  VISIT   HPI: 34 y.o.   Married  Caucasian  female   G1P1001 with Patient's last menstrual period was 06/09/2015 (exact date).   here to be evaluated for tubal ligation.     Desires permanent contraception.   Menses are regular and no abnormal bleeding or excessive pain.  Menses are 41 days apart.  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   GYNECOLOGIC HISTORY: Patient's last menstrual period was 06/09/2015 (exact date). Contraception: Rhythm Menopausal hormone therapy: n/a Last mammogram: n/a Last pap smear: 10-04-13 Neg:Neg HR HPV        OB History    Gravida Para Term Preterm AB TAB SAB Ectopic Multiple Living   1 1 1       1          There are no active problems to display for this patient.   Past Medical History  Diagnosis Date  . STD (sexually transmitted disease)     HX HSV    Past Surgical History  Procedure Laterality Date  . Cholecystectomy  2004  . Appendectomy  2004  . Cesarean section  2003  . Augmentation mammaplasty  2011    No current outpatient prescriptions on file.   No current facility-administered medications for this visit.     ALLERGIES: Review of patient's allergies indicates no known allergies.  Family History  Problem Relation Age of Onset  . Colon polyps Mother     multiple pre-cancerous polyps on several colonoscopies  . Diabetes Father   . Hypertension Father   . Hyperlipidemia Father   . Diabetes Paternal Grandmother   . Hyperlipidemia Paternal Grandmother   . Hypertension Paternal Grandmother     Social History   Social History  . Marital Status: Married    Spouse Name: N/A  . Number of Children: N/A  . Years of Education: N/A   Occupational History  . Not on file.   Social History Main Topics  . Smoking status: Never Smoker   . Smokeless tobacco: Not on file  . Alcohol Use: 1.8 oz/week    3 Standard drinks or equivalent per week   . Drug Use: No  . Sexual Activity:    Partners: Male    Birth Control/ Protection: None     Comment: Rhythm   Other Topics Concern  . Not on file   Social History Narrative    ROS:  Pertinent items are noted in HPI.  PHYSICAL EXAMINATION:    BP 108/76 mmHg  Pulse 76  Resp 16  Ht 5\' 4"  (1.626 m)  Wt 167 lb 9.6 oz (76.023 kg)  BMI 28.75 kg/m2  LMP 06/09/2015 (Exact Date)    General appearance: alert, cooperative and appears stated age Head: Normocephalic, without obvious abnormality, atraumatic Neck: no adenopathy, supple, symmetrical, trachea midline and thyroid normal to inspection and palpation Lungs: clear to auscultation bilaterally Heart: regular rate and rhythm Abdomen: laparoscopic incisions, soft, non-tender; bowel sounds normal; no masses,  no organomegaly Extremities: extremities normal, atraumatic, no cyanosis or edema Skin: Skin color, texture, turgor normal. No rashes or lesions Lymph nodes: Cervical, supraclavicular, and axillary nodes normal. No abnormal inguinal nodes palpated Neurologic: Grossly normal  Pelvic: External genitalia:  no lesions              Urethra:  normal appearing urethra with no masses, tenderness or lesions  Bartholins and Skenes: normal                 Vagina: normal appearing vagina with normal color and discharge, no lesions              Cervix: no lesions             Bimanual Exam:  Uterus:  normal size, contour, position, consistency, mobility, non-tender              Adnexa: normal adnexa and no mass, fullness, tenderness             Chaperone was present for exam.  ASSESSMENT  Desire for permanent contraception.  Hx of prior abdominal surgeries.   PLAN  Counseled regarding tubal ligation - Essure, laparoscopic BTL, and laparoscopic bilateral salpingectomy.   I discussed risks and benefits of procedure.  Risks include but are not limited to bleeding, infection, damage to surrounding organs, DVT, PE, death,  reaction to anesthesia, need for reoperation, failure of the tubal ligatoin of 1 in 205 to 1 in 300 which may result in either intrauterine or ectopic pregnancy.   If possible patient prefers bilateral salpingectomy as her method of tubal ligation due to the reduction in risk of ovarian/tubal cancer.  If she does not have equal insurance coverage benefits for the salpingectomy form of tubal ligation, she wishes to proceed with laparoscopic BTL, which I would do with cautery.    An After Visit Summary was printed and given to the patient.  __25____ minutes face to face time of which over 50% was spent in counseling.

## 2015-07-05 NOTE — H&P (Signed)
Patton SallesBrook E Amundson C Silva, MD at 07/03/2015 10:26 AM     Status: Signed       Expand All Collapse All   Patient ID: Ann Whitehead, female DOB: 09-02-1980, 34 y.o. MRN: [id number] GYNECOLOGY VISIT  HPI: 34 y.o. Married Caucasian female  G1P1001 with Patient's last menstrual period was 06/09/2015 (exact date).  here to be evaluated for tubal ligation.   Desires permanent contraception.   Menses are regular and no abnormal bleeding or excessive pain.  Menses are 41 days apart.  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   GYNECOLOGIC HISTORY: Patient's last menstrual period was 06/09/2015 (exact date). Contraception: Rhythm Menopausal hormone therapy: n/a Last mammogram: n/a Last pap smear: 10-04-13 Neg:Neg HR HPV   OB History    Gravida Para Term Preterm AB TAB SAB Ectopic Multiple Living   1 1 1       1        There are no active problems to display for this patient.   Past Medical History  Diagnosis Date  . STD (sexually transmitted disease)     HX HSV    Past Surgical History  Procedure Laterality Date  . Cholecystectomy  2004  . Appendectomy  2004  . Cesarean section  2003  . Augmentation mammaplasty  2011    No current outpatient prescriptions on file.   No current facility-administered medications for this visit.     ALLERGIES: Review of patient's allergies indicates no known allergies.  Family History  Problem Relation Age of Onset  . Colon polyps Mother     multiple pre-cancerous polyps on several colonoscopies  . Diabetes Father   . Hypertension Father   . Hyperlipidemia Father   . Diabetes Paternal Grandmother   . Hyperlipidemia Paternal Grandmother   . Hypertension Paternal Grandmother     Social History   Social History  . Marital Status: Married    Spouse Name: N/A  . Number of Children: N/A  . Years of  Education: N/A   Occupational History  . Not on file.   Social History Main Topics  . Smoking status: Never Smoker   . Smokeless tobacco: Not on file  . Alcohol Use: 1.8 oz/week    3 Standard drinks or equivalent per week  . Drug Use: No  . Sexual Activity:    Partners: Male    Birth Control/ Protection: None     Comment: Rhythm   Other Topics Concern  . Not on file   Social History Narrative    ROS: Pertinent items are noted in HPI.  PHYSICAL EXAMINATION:   BP 108/76 mmHg  Pulse 76  Resp 16  Ht 5\' 4"  (1.626 m)  Wt 167 lb 9.6 oz (76.023 kg)  BMI 28.75 kg/m2  LMP 06/09/2015 (Exact Date)  General appearance: alert, cooperative and appears stated age Head: Normocephalic, without obvious abnormality, atraumatic Neck: no adenopathy, supple, symmetrical, trachea midline and thyroid normal to inspection and palpation Lungs: clear to auscultation bilaterally Heart: regular rate and rhythm Abdomen: laparoscopic incisions, soft, non-tender; bowel sounds normal; no masses, no organomegaly Extremities: extremities normal, atraumatic, no cyanosis or edema Skin: Skin color, texture, turgor normal. No rashes or lesions Lymph nodes: Cervical, supraclavicular, and axillary nodes normal. No abnormal inguinal nodes palpated Neurologic: Grossly normal  Pelvic: External genitalia: no lesions  Urethra: normal appearing urethra with no masses, tenderness or lesions  Bartholins and Skenes: normal   Vagina: normal appearing vagina with normal color and discharge, no lesions  Cervix: no  lesions   Bimanual Exam: Uterus: normal size, contour, position, consistency, mobility, non-tender  Adnexa: normal adnexa and no mass, fullness, tenderness    Chaperone was present for exam.  ASSESSMENT  Desire for permanent contraception.  Hx of prior  abdominal surgeries.   PLAN  Counseled regarding tubal ligation - Essure, laparoscopic BTL, and laparoscopic bilateral salpingectomy.  I discussed risks and benefits of procedure. Risks include but are not limited to bleeding, infection, damage to surrounding organs, DVT, PE, death, reaction to anesthesia, need for reoperation, failure of the tubal ligatoin of 1 in 205 to 1 in 300 which may result in either intrauterine or ectopic pregnancy.  If possible patient prefers bilateral salpingectomy as her method of tubal ligation due to the reduction in risk of ovarian/tubal cancer. If she does not have equal insurance coverage benefits for the salpingectomy form of tubal ligation, she wishes to proceed with laparoscopic BTL, which I would do with cautery.   An After Visit Summary was printed and given to the patient.  __25____ minutes face to face time of which over 50% was spent in counseling.

## 2015-07-06 ENCOUNTER — Telehealth: Payer: Self-pay | Admitting: Obstetrics and Gynecology

## 2015-07-06 NOTE — Telephone Encounter (Signed)
No problem to proceed with surgery the same day as her menstrual cycle.

## 2015-07-06 NOTE — Telephone Encounter (Signed)
Patient is scheduled for Laparoscopic tubal ligation on 07/21/2015. States she is supposed to start her cycle that day. Okay to proceed with surgery as scheduled?

## 2015-07-06 NOTE — Telephone Encounter (Signed)
Spoke with patient. Advised of message as seen below from Dr.Silva. Patient is agreeable and verbalizes understanding.  Routing to provider for final review. Patient agreeable to disposition. Will close encounter.  

## 2015-07-06 NOTE — Telephone Encounter (Signed)
Patient is scheduled for surgery on December 6. Her cycle is suppose to start the day before.

## 2015-07-14 ENCOUNTER — Telehealth: Payer: Self-pay | Admitting: Obstetrics and Gynecology

## 2015-07-14 NOTE — Telephone Encounter (Signed)
Patient is calling to speak with Kennon RoundsSally. She wants to know if you were able to move her down for surgery?

## 2015-07-14 NOTE — H&P (Signed)
Patton SallesBrook E Amundson C Silva, MD at 07/03/2015 10:26 AM     Status: Signed       Expand All Collapse All   Patient ID: Ann Whitehead, female DOB: 09-02-1980, 34 y.o. MRN: [id number] GYNECOLOGY VISIT  HPI: 34 y.o. Married Caucasian female  [id number] with Patient's last menstrual period was 06/09/2015 (exact date).  here to be evaluated for tubal ligation.   Desires permanent contraception.   Menses are regular and no abnormal bleeding or excessive pain.  Menses are 41 days apart.  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   GYNECOLOGIC HISTORY: Patient's last menstrual period was 06/09/2015 (exact date). Contraception: Rhythm Menopausal hormone therapy: n/a Last mammogram: n/a Last pap smear: 10-04-13 Neg:Neg HR HPV   OB History    Gravida Para Term Preterm AB TAB SAB Ectopic Multiple Living   1 1 1       1        There are no active problems to display for this patient.   Past Medical History  Diagnosis Date  . STD (sexually transmitted disease)     HX HSV    Past Surgical History  Procedure Laterality Date  . Cholecystectomy  2004  . Appendectomy  2004  . Cesarean section  2003  . Augmentation mammaplasty  2011    No current outpatient prescriptions on file.   No current facility-administered medications for this visit.     ALLERGIES: Review of patient's allergies indicates no known allergies.  Family History  Problem Relation Age of Onset  . Colon polyps Mother     multiple pre-cancerous polyps on several colonoscopies  . Diabetes Father   . Hypertension Father   . Hyperlipidemia Father   . Diabetes Paternal Grandmother   . Hyperlipidemia Paternal Grandmother   . Hypertension Paternal Grandmother     Social History   Social History  . Marital Status: Married    Spouse Name: N/A  . Number of Children: N/A  . Years of  Education: N/A   Occupational History  . Not on file.   Social History Main Topics  . Smoking status: Never Smoker   . Smokeless tobacco: Not on file  . Alcohol Use: 1.8 oz/week    3 Standard drinks or equivalent per week  . Drug Use: No  . Sexual Activity:    Partners: Male    Birth Control/ Protection: None     Comment: Rhythm   Other Topics Concern  . Not on file   Social History Narrative    ROS: Pertinent items are noted in HPI.  PHYSICAL EXAMINATION:   BP 108/76 mmHg  Pulse 76  Resp 16  Ht 5\' 4"  (1.626 m)  Wt 167 lb 9.6 oz (76.023 kg)  BMI 28.75 kg/m2  LMP 06/09/2015 (Exact Date)  General appearance: alert, cooperative and appears stated age Head: Normocephalic, without obvious abnormality, atraumatic Neck: no adenopathy, supple, symmetrical, trachea midline and thyroid normal to inspection and palpation Lungs: clear to auscultation bilaterally Heart: regular rate and rhythm Abdomen: laparoscopic incisions, soft, non-tender; bowel sounds normal; no masses, no organomegaly Extremities: extremities normal, atraumatic, no cyanosis or edema Skin: Skin color, texture, turgor normal. No rashes or lesions Lymph nodes: Cervical, supraclavicular, and axillary nodes normal. No abnormal inguinal nodes palpated Neurologic: Grossly normal  Pelvic: External genitalia: no lesions  Urethra: normal appearing urethra with no masses, tenderness or lesions  Bartholins and Skenes: normal   Vagina: normal appearing vagina with normal color and discharge, no lesions  Cervix: no  lesions   Bimanual Exam: Uterus: normal size, contour, position, consistency, mobility, non-tender  Adnexa: normal adnexa and no mass, fullness, tenderness    Chaperone was present for exam.  ASSESSMENT  Desire for permanent contraception.  Hx of prior  abdominal surgeries.   PLAN  Counseled regarding tubal ligation - Essure, laparoscopic BTL, and laparoscopic bilateral salpingectomy.  I discussed risks and benefits of procedure. Risks include but are not limited to bleeding, infection, damage to surrounding organs, DVT, PE, death, reaction to anesthesia, need for reoperation, failure of the tubal ligatoin of 1 in 205 to 1 in 300 which may result in either intrauterine or ectopic pregnancy.  If possible patient prefers bilateral salpingectomy as her method of tubal ligation due to the reduction in risk of ovarian/tubal cancer. If she does not have equal insurance coverage benefits for the salpingectomy form of tubal ligation, she wishes to proceed with laparoscopic BTL, which I would do with cautery.   An After Visit Summary was printed and given to the patient.  __25____ minutes face to face time of which over 50% was spent in counseling.            

## 2015-07-15 NOTE — Telephone Encounter (Signed)
Call to patient. Confirmed start time for surgery is 0900 on 07-21-15.   Routing to provider for final review. Patient agreeable to disposition. Will close encounter.

## 2015-07-21 ENCOUNTER — Encounter (HOSPITAL_COMMUNITY)
Admission: RE | Disposition: A | Discharge status: Home / Self Care | Payer: Self-pay | Source: Ambulatory Visit | Attending: Obstetrics and Gynecology

## 2015-07-21 ENCOUNTER — Ambulatory Visit (HOSPITAL_COMMUNITY): Payer: Commercial Managed Care - HMO | Admitting: Anesthesiology

## 2015-07-21 ENCOUNTER — Ambulatory Visit (HOSPITAL_COMMUNITY)
Admission: RE | Admit: 2015-07-21 | Discharge: 2015-07-21 | Disposition: A | Discharge status: Home / Self Care | Payer: Commercial Managed Care - HMO | Source: Ambulatory Visit | Attending: Obstetrics and Gynecology | Admitting: Obstetrics and Gynecology

## 2015-07-21 ENCOUNTER — Encounter (HOSPITAL_COMMUNITY): Payer: Self-pay | Admitting: *Deleted

## 2015-07-21 DIAGNOSIS — N83209 Unspecified ovarian cyst, unspecified side: Secondary | ICD-10-CM | POA: Insufficient documentation

## 2015-07-21 DIAGNOSIS — N838 Other noninflammatory disorders of ovary, fallopian tube and broad ligament: Secondary | ICD-10-CM | POA: Diagnosis not present

## 2015-07-21 DIAGNOSIS — N803 Endometriosis of pelvic peritoneum: Secondary | ICD-10-CM | POA: Diagnosis not present

## 2015-07-21 DIAGNOSIS — Z302 Encounter for sterilization: Secondary | ICD-10-CM | POA: Diagnosis not present

## 2015-07-21 HISTORY — PX: ABLATION ON ENDOMETRIOSIS: SHX5787

## 2015-07-21 HISTORY — PX: UNILATERAL SALPINGECTOMY: SHX6160

## 2015-07-21 HISTORY — PX: OVARIAN CYST REMOVAL: SHX89

## 2015-07-21 HISTORY — PX: LAPAROSCOPIC TUBAL LIGATION: SHX1937

## 2015-07-21 LAB — CBC
HCT: 36.2 % (ref 36.0–46.0)
HEMOGLOBIN: 11.8 g/dL — AB (ref 12.0–15.0)
MCH: 30 pg (ref 26.0–34.0)
MCHC: 32.6 g/dL (ref 30.0–36.0)
MCV: 92.1 fL (ref 78.0–100.0)
Platelets: 365 10*3/uL (ref 150–400)
RBC: 3.93 MIL/uL (ref 3.87–5.11)
RDW: 13.3 % (ref 11.5–15.5)
WBC: 8.7 10*3/uL (ref 4.0–10.5)

## 2015-07-21 LAB — PREGNANCY, URINE: Preg Test, Ur: NEGATIVE

## 2015-07-21 SURGERY — LIGATION, FALLOPIAN TUBE, LAPAROSCOPIC
Anesthesia: General | Site: Abdomen | Laterality: Right

## 2015-07-21 MED ORDER — KETOROLAC TROMETHAMINE 30 MG/ML IJ SOLN
INTRAMUSCULAR | Status: AC
  Filled 2015-07-21: qty 1

## 2015-07-21 MED ORDER — CEFAZOLIN SODIUM-DEXTROSE 2-3 GM-% IV SOLR
INTRAVENOUS | Status: AC
  Filled 2015-07-21: qty 50

## 2015-07-21 MED ORDER — KETOROLAC TROMETHAMINE 30 MG/ML IJ SOLN
INTRAMUSCULAR | Status: DC | PRN
Start: 2015-07-21 — End: 2015-07-21
  Administered 2015-07-21: 30 mg via INTRAVENOUS

## 2015-07-21 MED ORDER — BUPIVACAINE HCL (PF) 0.25 % IJ SOLN
INTRAMUSCULAR | Status: AC
  Filled 2015-07-21: qty 30

## 2015-07-21 MED ORDER — GLYCOPYRROLATE 0.2 MG/ML IJ SOLN
INTRAMUSCULAR | Status: DC | PRN
  Administered 2015-07-21: 0.2 mg via INTRAVENOUS
  Administered 2015-07-21: 0.4 mg via INTRAVENOUS

## 2015-07-21 MED ORDER — DEXAMETHASONE SODIUM PHOSPHATE 4 MG/ML IJ SOLN
INTRAMUSCULAR | Status: AC
  Filled 2015-07-21: qty 1

## 2015-07-21 MED ORDER — DEXAMETHASONE SODIUM PHOSPHATE 10 MG/ML IJ SOLN
INTRAMUSCULAR | Status: DC | PRN
  Administered 2015-07-21: 4 mg via INTRAVENOUS

## 2015-07-21 MED ORDER — SODIUM CHLORIDE 0.9 % IJ SOLN
INTRAMUSCULAR | Status: AC
  Filled 2015-07-21: qty 10

## 2015-07-21 MED ORDER — SCOPOLAMINE 1 MG/3DAYS TD PT72
1.0000 | MEDICATED_PATCH | Freq: Once | TRANSDERMAL | Status: DC
  Administered 2015-07-21: 1.5 mg via TRANSDERMAL

## 2015-07-21 MED ORDER — FENTANYL CITRATE (PF) 250 MCG/5ML IJ SOLN
INTRAMUSCULAR | Status: AC
  Filled 2015-07-21: qty 5

## 2015-07-21 MED ORDER — ROCURONIUM BROMIDE 100 MG/10ML IV SOLN
INTRAVENOUS | Status: DC | PRN
Start: 2015-07-21 — End: 2015-07-21
  Administered 2015-07-21: 5 mg via INTRAVENOUS
  Administered 2015-07-21: 25 mg via INTRAVENOUS

## 2015-07-21 MED ORDER — BUPIVACAINE HCL (PF) 0.25 % IJ SOLN
INTRAMUSCULAR | Status: DC | PRN
  Administered 2015-07-21: 8 mL

## 2015-07-21 MED ORDER — CEFAZOLIN SODIUM-DEXTROSE 2-3 GM-% IV SOLR
2.0000 g | INTRAVENOUS | Status: AC
  Administered 2015-07-21: 2 g via INTRAVENOUS

## 2015-07-21 MED ORDER — FENTANYL CITRATE (PF) 100 MCG/2ML IJ SOLN
INTRAMUSCULAR | Status: AC
  Filled 2015-07-21: qty 2

## 2015-07-21 MED ORDER — SCOPOLAMINE 1 MG/3DAYS TD PT72
MEDICATED_PATCH | TRANSDERMAL | Status: AC
  Administered 2015-07-21: 1.5 mg via TRANSDERMAL
  Filled 2015-07-21: qty 1

## 2015-07-21 MED ORDER — GLYCOPYRROLATE 0.2 MG/ML IJ SOLN
INTRAMUSCULAR | Status: AC
  Filled 2015-07-21: qty 1

## 2015-07-21 MED ORDER — PROPOFOL 10 MG/ML IV BOLUS
INTRAVENOUS | Status: AC
Start: 2015-07-21 — End: 2015-07-21
  Filled 2015-07-21: qty 20

## 2015-07-21 MED ORDER — OXYCODONE-ACETAMINOPHEN 5-325 MG PO TABS
1.0000 | ORAL_TABLET | Freq: Once | ORAL | Status: AC
  Administered 2015-07-21: 1 via ORAL

## 2015-07-21 MED ORDER — KETOROLAC TROMETHAMINE 30 MG/ML IJ SOLN: 30.0000 mg | Freq: Once | INTRAMUSCULAR | Status: DC | PRN

## 2015-07-21 MED ORDER — MEPERIDINE HCL 25 MG/ML IJ SOLN: 6.2500 mg | INTRAMUSCULAR | Status: DC | PRN

## 2015-07-21 MED ORDER — OXYCODONE-ACETAMINOPHEN 5-325 MG PO TABS: 1.0000 | ORAL_TABLET | ORAL | Status: DC | PRN

## 2015-07-21 MED ORDER — ONDANSETRON HCL 4 MG/2ML IJ SOLN
INTRAMUSCULAR | Status: DC | PRN
  Administered 2015-07-21: 4 mg via INTRAVENOUS

## 2015-07-21 MED ORDER — MIDAZOLAM HCL 2 MG/2ML IJ SOLN
INTRAMUSCULAR | Status: AC
  Filled 2015-07-21: qty 2

## 2015-07-21 MED ORDER — CEFAZOLIN SODIUM-DEXTROSE 2-3 GM-% IV SOLR: 2.0000 g | INTRAVENOUS | Status: DC

## 2015-07-21 MED ORDER — FENTANYL CITRATE (PF) 100 MCG/2ML IJ SOLN
25.0000 ug | INTRAMUSCULAR | Status: DC | PRN
  Administered 2015-07-21 (×2): 50 ug via INTRAVENOUS

## 2015-07-21 MED ORDER — LACTATED RINGERS IV SOLN: INTRAVENOUS | Status: DC

## 2015-07-21 MED ORDER — ONDANSETRON HCL 4 MG/2ML IJ SOLN
INTRAMUSCULAR | Status: AC
  Filled 2015-07-21: qty 4

## 2015-07-21 MED ORDER — FENTANYL CITRATE (PF) 100 MCG/2ML IJ SOLN
INTRAMUSCULAR | Status: DC | PRN
  Administered 2015-07-21 (×2): 100 ug via INTRAVENOUS
  Administered 2015-07-21: 150 ug via INTRAVENOUS

## 2015-07-21 MED ORDER — IBUPROFEN 800 MG PO TABS: 800.0000 mg | ORAL_TABLET | Freq: Three times a day (TID) | ORAL | Status: DC | PRN

## 2015-07-21 MED ORDER — LIDOCAINE HCL (CARDIAC) 20 MG/ML IV SOLN
INTRAVENOUS | Status: DC | PRN
  Administered 2015-07-21: 50 mg via INTRAVENOUS

## 2015-07-21 MED ORDER — NEOSTIGMINE METHYLSULFATE 10 MG/10ML IV SOLN
INTRAVENOUS | Status: DC | PRN
  Administered 2015-07-21: 3 mg via INTRAVENOUS

## 2015-07-21 MED ORDER — LACTATED RINGERS IV SOLN
INTRAVENOUS | Status: DC
  Administered 2015-07-21: 09:00:00 via INTRAVENOUS

## 2015-07-21 MED ORDER — PROPOFOL 10 MG/ML IV BOLUS
INTRAVENOUS | Status: AC
  Filled 2015-07-21: qty 20

## 2015-07-21 MED ORDER — ONDANSETRON HCL 4 MG/2ML IJ SOLN: 4.0000 mg | Freq: Once | INTRAMUSCULAR | Status: DC | PRN

## 2015-07-21 MED ORDER — PROPOFOL 10 MG/ML IV BOLUS
INTRAVENOUS | Status: DC | PRN
  Administered 2015-07-21: 50 mg via INTRAVENOUS
  Administered 2015-07-21: 200 mg via INTRAVENOUS

## 2015-07-21 MED ORDER — SUCCINYLCHOLINE CHLORIDE 20 MG/ML IJ SOLN
INTRAMUSCULAR | Status: DC | PRN
  Administered 2015-07-21: 100 mg via INTRAVENOUS

## 2015-07-21 MED ORDER — LACTATED RINGERS IV SOLN
INTRAVENOUS | Status: DC
  Administered 2015-07-21: 08:00:00 via INTRAVENOUS

## 2015-07-21 MED ORDER — MIDAZOLAM HCL 2 MG/2ML IJ SOLN
INTRAMUSCULAR | Status: DC | PRN
  Administered 2015-07-21: 2 mg via INTRAVENOUS

## 2015-07-21 MED ORDER — OXYCODONE-ACETAMINOPHEN 5-325 MG PO TABS
ORAL_TABLET | ORAL | Status: AC
  Filled 2015-07-21: qty 1

## 2015-07-21 SURGICAL SUPPLY — 25 items
APL SKNCLS STERI-STRIP NONHPOA (GAUZE/BANDAGES/DRESSINGS)
BAG SPEC RTRVL LRG 6X4 10 (ENDOMECHANICALS) ×3
BENZOIN TINCTURE PRP APPL 2/3 (GAUZE/BANDAGES/DRESSINGS) ×3 IMPLANT
CABLE HIGH FREQUENCY MONO STRZ (ELECTRODE) IMPLANT
CLOTH BEACON ORANGE TIMEOUT ST (SAFETY) ×4 IMPLANT
CONT PATH 16OZ SNAP LID 3702 (MISCELLANEOUS) ×1 IMPLANT
DRSG COVADERM PLUS 2X2 (GAUZE/BANDAGES/DRESSINGS) ×6 IMPLANT
DRSG OPSITE POSTOP 3X4 (GAUZE/BANDAGES/DRESSINGS) ×2 IMPLANT
FORCEPS CUTTING 33CM 5MM (CUTTING FORCEPS) ×1 IMPLANT
GLOVE BIO SURGEON STRL SZ 6.5 (GLOVE) ×8 IMPLANT
GLOVE BIOGEL PI IND STRL 7.0 (GLOVE) ×3 IMPLANT
GLOVE BIOGEL PI INDICATOR 7.0 (GLOVE) ×1
GOWN STRL REUS W/TWL LRG LVL3 (GOWN DISPOSABLE) ×8 IMPLANT
PACK LAPAROSCOPY BASIN (CUSTOM PROCEDURE TRAY) ×4 IMPLANT
PAD POSITIONING PINK XL (MISCELLANEOUS) ×4 IMPLANT
POUCH SPECIMEN RETRIEVAL 10MM (ENDOMECHANICALS) ×1 IMPLANT
SCISSORS LAP 5X35 DISP (ENDOMECHANICALS) IMPLANT
STRIP CLOSURE SKIN 1/4X4 (GAUZE/BANDAGES/DRESSINGS) ×3 IMPLANT
SUT PLAIN 3 0 FS 2 27 (SUTURE) ×4 IMPLANT
SYR 30ML LL (SYRINGE) ×1 IMPLANT
TOWEL OR 17X24 6PK STRL BLUE (TOWEL DISPOSABLE) ×8 IMPLANT
TRAY FOLEY CATH SILVER 14FR (SET/KITS/TRAYS/PACK) ×4 IMPLANT
TROCAR XCEL NON-BLD 11X100MML (ENDOMECHANICALS) ×1 IMPLANT
WARMER LAPAROSCOPE (MISCELLANEOUS) ×4 IMPLANT
WATER STERILE IRR 1000ML POUR (IV SOLUTION) ×4 IMPLANT

## 2015-07-21 NOTE — Progress Notes (Signed)
Update to History and Physical  No marked change in status since office pre-op visit.   Patient examined.   OK to proceed with surgery. 

## 2015-07-21 NOTE — Anesthesia Preprocedure Evaluation (Signed)
Anesthesia Evaluation  Patient identified by MRN, date of birth, ID band Patient awake    Reviewed: Allergy & Precautions, H&P , NPO status , Patient's Chart, lab work & pertinent test results, reviewed documented beta blocker date and time   Airway Mallampati: I  TM Distance: >3 FB Neck ROM: full    Dental no notable dental hx. (+) Teeth Intact   Pulmonary neg pulmonary ROS,    Pulmonary exam normal        Cardiovascular negative cardio ROS Normal cardiovascular exam     Neuro/Psych negative neurological ROS  negative psych ROS   GI/Hepatic negative GI ROS, Neg liver ROS,   Endo/Other  negative endocrine ROS  Renal/GU negative Renal ROS     Musculoskeletal   Abdominal Normal abdominal exam  (+)   Peds  Hematology negative hematology ROS (+)   Anesthesia Other Findings   Reproductive/Obstetrics negative OB ROS                             Anesthesia Physical Anesthesia Plan  ASA: II  Anesthesia Plan: General   Post-op Pain Management:    Induction: Intravenous  Airway Management Planned: Oral ETT  Additional Equipment:   Intra-op Plan:   Post-operative Plan:   Informed Consent: I have reviewed the patients History and Physical, chart, labs and discussed the procedure including the risks, benefits and alternatives for the proposed anesthesia with the patient or authorized representative who has indicated his/her understanding and acceptance.   Dental Advisory Given  Plan Discussed with: CRNA  Anesthesia Plan Comments:         Anesthesia Quick Evaluation

## 2015-07-21 NOTE — Discharge Instructions (Signed)
Hi Ann Whitehead,   I found a few unexpected things during your surgery today that I needed to treat.  You are fine!  You had a 7 cm cyst attached to your left tube and ovary.  It looks like what we call a paraovarian cyst, and it does not appear cancerous in any way.  I removed the cyst and the end of your fallopian tube on the left side in order to insure complete removal of the paraovarian cyst.  This in effect performed the tubal ligation on the left side.  On the right side, I did the traditional tubal ligation.    I also found signs of endometriosis in the pelvis on both ovaries, over the bladder region, back behind the uterus and on the pelvic sidewalls.  I did cauterize these areas.  Endometriosis can cause pelvic pain, pain with intercourse, and painful periods.   Your surgery went great!  I will be able to share pictures with you when you come in for your post op visit.   Thank you,   Conley Simmonds, MD     Laparoscopic Tubal Ligation, Care After Refer to this sheet in the next few weeks. These instructions provide you with information about caring for yourself after your procedure. Your health care provider may also give you more specific instructions. Your treatment has been planned according to current medical practices, but problems sometimes occur. Call your health care provider if you have any problems or questions after your procedure. WHAT TO EXPECT AFTER THE PROCEDURE After your procedure, it is common to have:  Sore throat.  Soreness at the incision site.  Mild cramping.  Tiredness.  Mild nausea or vomiting.  Shoulder pain. HOME CARE INSTRUCTIONS  Rest for the remainder of the day.  Take medicines only as directed by your health care provider. These include over-the-counter medicines and prescription medicines. Do not take aspirin, which can cause bleeding.  Over the next few days, gradually return to your normal activities and your normal diet.  Avoid sexual  intercourse for 2 weeks or as directed by your health care provider.  Do not use tampons, and do not douche.  Do not drive or operate heavy machinery while taking pain medicine.  Do not lift anything that is heavier than 5 lb (2.3 kg) for 2 weeks or as directed by your health care provider.  Do not take baths. Take showers only. Ask your health care provider when you can start taking baths.  Take your temperature twice each day and write it down.  Try to have help for your household needs for the first 7-10 days.  There are many different ways to close and cover an incision, including stitches (sutures), skin glue, and adhesive strips. Follow instructions from your health care provider about:  Incision care.  Bandage (dressing) changes and removal.  Incision closure removal.  Check your incision area every day for signs of infection. Watch for:  Redness, swelling, or pain.  Fluid, blood, or pus.  Keep all follow-up visits as directed by your health care provider. SEEK MEDICAL CARE IF:  You have redness, swelling, or increasing pain in your incision area.  You have fluid, blood, or pus coming from your incision for longer than 1 day.  You notice a bad smell coming from your incision or your dressing.  The edges of your incision break open after the sutures have been removed.  Your pain does not decrease after 2-3 days.  You have a rash.  You repeatedly become dizzy or light-headed.  You have a reaction to your medicine.  Your pain medicine is not helping.  You are constipated. SEEK IMMEDIATE MEDICAL CARE IF:  You have a fever.  You faint.  You have increasing pain in your abdomen.  You have severe pain in one or both of your shoulders.  You have bleeding or drainage from your suture sites or your vagina after surgery.  You have shortness of breath or have difficulty breathing.  You have chest pain or leg pain.  You have ongoing nausea, vomiting, or  diarrhea.   This information is not intended to replace advice given to you by your health care provider. Make sure you discuss any questions you have with your health care provider.   Document Released: 02/18/2005 Document Revised: 12/16/2014 Document Reviewed: 11/12/2011 Elsevier Interactive Patient Education 2016 [employer]   Post Anesthesia Home Care Instructions  Activity: Get plenty of rest for the remainder of the day. A responsible adult should stay with you for 24 hours following the procedure.  For the next 24 hours, DO NOT: -Drive a car -Advertising copywriterperate machinery -Drink alcoholic beverages -Take any medication unless instructed by your physician -Make any legal decisions or sign important papers.  Meals: Start with liquid foods such as gelatin or soup. Progress to regular foods as tolerated. Avoid greasy, spicy, heavy foods. If nausea and/or vomiting occur, drink only clear liquids until the nausea and/or vomiting subsides. Call your physician if vomiting continues.  Special Instructions/Symptoms: Your throat may feel dry or sore from the anesthesia or the breathing tube placed in your throat during surgery. If this causes discomfort, gargle with warm salt water. The discomfort should disappear within 24 hours.  If you had a scopolamine patch placed behind your ear for the management of post- operative nausea and/or vomiting:  1. The medication in the patch is effective for 72 hours, after which it should be removed.  Wrap patch in a tissue and discard in the trash. Wash hands thoroughly with soap and water. 2. You may remove the patch earlier than 72 hours if you experience unpleasant side effects which may include dry mouth, dizziness or visual disturbances. 3. Avoid touching the patch. Wash your hands with soap and water after contact with the patch.

## 2015-07-21 NOTE — Transfer of Care (Signed)
Immediate Anesthesia Transfer of Care Note  Patient: Ann Whitehead  Procedure(s) Performed: Procedure(s): LAPAROSCOPIC TUBAL LIGATION (Right) FULGERATION OF ENDOMETRIOSIS, BIOPSY OF POSTERIOR CUL DE SAC (N/A) EXCISION PARA-OVARIAN CYST (Left) DISTAL UNILATERAL SALPINGECTOMY (Left)  Patient Location: PACU  Anesthesia Type:General  Level of Consciousness: awake, alert  and oriented  Airway & Oxygen Therapy: Patient Spontanous Breathing and Patient connected to nasal cannula oxygen  Post-op Assessment: Report given to RN and Post -op Vital signs reviewed and stable  Post vital signs: Reviewed and stable  Last Vitals:  Filed Vitals:   07/21/15 0739  BP: 121/87  Pulse: 73  Temp: 36.9 C  Resp: 20    Complications: No apparent anesthesia complications

## 2015-07-21 NOTE — Anesthesia Postprocedure Evaluation (Signed)
Anesthesia Post Note  Patient: Ann Whitehead  Procedure(s) Performed: Procedure(s) (LRB): LAPAROSCOPIC TUBAL LIGATION (Right) FULGERATION OF ENDOMETRIOSIS, BIOPSY OF POSTERIOR CUL DE SAC (N/A) EXCISION PARA-OVARIAN CYST (Left) DISTAL UNILATERAL SALPINGECTOMY (Left)  Patient location during evaluation: PACU Anesthesia Type: General Level of consciousness: awake Pain management: pain level controlled Vital Signs Assessment: post-procedure vital signs reviewed and stable Respiratory status: spontaneous breathing Cardiovascular status: stable Postop Assessment: no signs of nausea or vomiting Anesthetic complications: no    Last Vitals:  Filed Vitals:   07/21/15 1145 07/21/15 1200  BP: 114/73 112/82  Pulse: 63 58  Temp:    Resp: 10 12    Last Pain:  Filed Vitals:   07/21/15 1210  PainSc: 6                  Cristyn Crossno JR,JOHN Artem Bunte

## 2015-07-21 NOTE — Anesthesia Procedure Notes (Signed)
Procedure Name: Intubation Date/Time: 07/21/2015 9:34 AM Performed by: Shanon PayorGREGORY, Shyvonne Chastang M Pre-anesthesia Checklist: Patient identified, Emergency Drugs available, Suction available, Patient being monitored and Timeout performed Patient Re-evaluated:Patient Re-evaluated prior to inductionOxygen Delivery Method: Circle system utilized Preoxygenation: Pre-oxygenation with 100% oxygen Intubation Type: IV induction Ventilation: Mask ventilation without difficulty Laryngoscope Size: Mac and 3 Grade View: Grade I Tube type: Oral Tube size: 7.0 mm Number of attempts: 1 Airway Equipment and Method: Stylet Placement Confirmation: ETT inserted through vocal cords under direct vision,  positive ETCO2 and breath sounds checked- equal and bilateral Secured at: 21 cm Tube secured with: Tape Dental Injury: Teeth and Oropharynx as per pre-operative assessment

## 2015-07-21 NOTE — Brief Op Note (Signed)
07/21/2015  11:43 AM  PATIENT:  Ann Whitehead  34 y.o. female  PRE-OPERATIVE DIAGNOSIS:  Desires sterilization  POST-OPERATIVE DIAGNOSIS:  Desires sterilization, left para-ovarian cyst, mild endometriosis.   PROCEDURE:  Procedure(s): LAPAROSCOPIC TUBAL LIGATION (Right) FULGERATION OF ENDOMETRIOSIS, BIOPSY OF POSTERIOR CUL DE SAC (N/A) EXCISION PARA-OVARIAN CYST (Left) DISTAL UNILATERAL SALPINGECTOMY (Left)  SURGEON:  Surgeon(s) and Role:    * Maxxon Schwanke E Ardell IsaacsAmundson C Silva, MD - Primary  PHYSICIAN ASSISTANT: NA  ASSISTANTS: none   ANESTHESIA:   local and general  EBL:  Total I/O In: 1000 [I.V.:1000] Out: 310 [Urine:300; Blood:10]  BLOOD ADMINISTERED:none  DRAINS: none   LOCAL MEDICATIONS USED:  MARCAINE     SPECIMEN:  Source of Specimen:  left paraovarian cyst and distal left fallopian tube, posterior cul de sac peritoneal biopsy.   DISPOSITION OF SPECIMEN:  PATHOLOGY  COUNTS:  YES  TOURNIQUET:  * No tourniquets in log *  DICTATION: .Other Dictation: Dictation Number    PLAN OF CARE: Discharge to home after PACU  PATIENT DISPOSITION:  PACU - hemodynamically stable.   Delay start of Pharmacological VTE agent (>24hrs) due to surgical blood loss or risk of bleeding: not applicable

## 2015-07-22 ENCOUNTER — Encounter (HOSPITAL_COMMUNITY): Payer: Self-pay | Admitting: Obstetrics and Gynecology

## 2015-07-22 ENCOUNTER — Telehealth: Payer: Self-pay | Admitting: Obstetrics and Gynecology

## 2015-07-22 NOTE — Telephone Encounter (Signed)
Ok to remove dressings and get incisions wet. Just don't pick off the Dermabond glue!

## 2015-07-22 NOTE — Telephone Encounter (Signed)
Call to patient. She is one day post op. She would like to know if okay for her to shower today and when she can remove honey comb dressing.  Advised will send message to Dr. Edward JollySilva to confirm instructions and will return call. Patient agreeable.

## 2015-07-22 NOTE — Telephone Encounter (Signed)
Patient had surgery yesterday with Dr. Edward JollySilva and wants to know when it is okay to remove the surgical dressings.

## 2015-07-22 NOTE — Telephone Encounter (Signed)
Call to patient. Detailed message with instructions left on voicemail.  Designated party release form okay to leave detailed message.

## 2015-07-22 NOTE — Op Note (Signed)
NAME:  Ann Whitehead, Ann Whitehead NO.:  [id number]  MEDICAL RECORD NO.:  [id number]  LOCATION:  [facility]                          FACILITY:  [facility]  PHYSICIAN:  Randye Lobo, M.D.   DATE OF BIRTH:  1980-12-22  DATE OF PROCEDURE:  07/21/2015 DATE OF DISCHARGE:  07/21/2015                              OPERATIVE REPORT   PREOPERATIVE DIAGNOSIS:  Desire for permanent sterilization.  POSTOPERATIVE DIAGNOSES: 1. Desire for permanent sterilization. 2. Left para ovarian cyst. 3. Mild endometriosis.  PROCEDURE:  Laparoscopic excision of left para ovarian cyst, distal left salpingectomy, right tubal ligation with bipolar cautery, bipolar cautery fulguration of endometriosis, posterior cul-de-sac peritoneal biopsy.  SURGEON:  Randye Lobo, MD  ANESTHESIA:  General endotracheal, local with 0.25% Marcaine.  IV FLUIDS:  1000 mL Ringer's lactate.  ESTIMATED BLOOD LOSS:  10 mL.  URINE OUTPUT:  300 mL.  COMPLICATIONS:  None.  INDICATIONS FOR THE PROCEDURE:  The patient is a 34 year old, para 1 Caucasian female, who presents requesting permanent sterilization.  The patient declines any future childbearing.  A plan is made to proceed with a laparoscopic bilateral tubal ligation with bipolar cautery. Risks, benefits, and alternatives have been reviewed with the patient who wishes to proceed.  The patient understands that there was a failure rate of tubal ligation of approximately 1:250 to 1:300, which may result in either intrauterine or an ectopic pregnancy.  FINDINGS:  Laparoscopy demonstrated a 7 cm left para ovarian cyst.  This appeared to be coming off the ovary, fairly close to the distal end of the left fallopian tube which was splayed over the end of the cyst.  The right fallopian tube was unremarkable.  Each of the ovaries contained a small area of brown endometriosis lesions.  The uterus itself was unremarkable.  There were also small lesions of endometriosis in  the anterior cul-de-sac, the posterior cul-de-sac, and the bilateral pelvic sidewalls with concentrations near the uterosacral ligaments.  The patient had evidence of a prior appendectomy performed with staples. Staples were present on the serosa of the cecum.  There were also multiple staples which were attached to peritoneum in the cul-de-sac, one on the sigmoid colon, and in the anterior cul-de-sac.  None of the staples were removed.  In the upper abdomen, the liver appeared to be unremarkable as was the stomach organ.  There was no evidence of any adhesive disease in the abdomen or the pelvis.  SPECIMEN:  The left para-ovarian cyst with attached distal left fallopian tube and the cul de sac peritoneal biopsy were sent to pathology as a total of two specimens.   PROCEDURE IN DETAIL:  The patient was reidentified in the preoperative hold area.  She did receive Ancef 2 g IV for antibiotic prophylaxis. She received TED hose and PAS stockings for DVT prophylaxis.  In the operating room, the patient was placed in the dorsal lithotomy position with Allen stirrups and her arms were tucked at her sides with the appropriate arm boards.  The patient received general endotracheal anesthesia.  The abdomen and vagina were then sterilely prepped.  A speculum was placed inside the vagina and a single-tooth tenaculum was placed on the anterior cervical lip.  This  was replaced with a Hulka tenaculum and the remaining vaginal instruments were removed.  The Foley catheter was then sterilely placed and left to gravity drainage throughout the procedure.  The procedure began by injecting the infraumbilical incision that the patient previously had with 0.25% Marcaine.  The infraumbilical region was then incised with a scalpel.  Dissection down to the fascia was performed with a hemostat clamp.  A 10-mm trocar was then placed directly inside the peritoneal cavity and the laparoscope confirmed proper  placement.  A CO2 pneumoperitoneum was achieved.  The patient was then placed in the Trendelenburg position.  The left para ovarian cyst was identified.  5-mm incisions were then created in the right and left lower quadrants after the skin was injected locally with 0.25% Marcaine.  5 mm trocars were placed under direct visualization of the laparoscope.  The procedure began by performing the right tubal ligation with the gyrus instrument.  This was performed in the isthmic portion of the fallopian tube and involved a 2 cm segment of the fallopian tube.  The left para ovarian cyst excision and the distal left salpingectomy were performed at this time.  Again, the gyrus instrument was used to come across the pedicle of the para ovarian cyst and the distal left fallopian tube.  Hemostasis was excellent.  The specimen was then placed in the posterior cul-de-sac.  The gyrus instrument was then used to perform systematic cauterization of all the lesions of endometriosis.  The ureters were respected during the course of the cautery of these lesions.  There was a 1 small area of endometriosis approximately 3-4 mm in size over the left ureter which was not cauterized.  All remaining lesions were cauterized.  The left lower quadrant incision was enlarged at this time and the 5-mm trocar was replaced with a 10-11 mm trocar placed under visualization of the laparoscope.  An EndoCatch bag was then placed inside the peritoneal cavity and the distal left fallopian tube and the para ovarian cyst were placed in the EndoCatch bag.  The bag was brought up to the surface of the skin and the cyst was then ruptured with a scalpel well inside the bag and the cyst fluid was suctioned out.  The specimen and the EndoCatch bag were then removed and all of the specimen was sent to Pathology.  The 10-11 mm trocar was placed inside the peritoneal cavity again under visualization of the laparoscope.  Attention  was then turned to the posterior cul-de-sac.  A laparoscopic biopsy forceps was used to biopsy an area of endometriosis and then all remaining lesions in the cul-de-sac region were cauterized with the gyrus instrument.  Hemostasis was good at all operative sites at this time and the procedure was completed.  The 5 mm right lower quadrant trocar was removed under the visualization of the laparoscope as was the 10-11 mm left lower quadrant trocar.  The CO2 pneumoperitoneum was released and the patient received manual breaths prior to removing the umbilical trocar.  Two figure-of-eight sutures of 0 Vicryl were placed along the fascia of the umbilical region in the left lower quadrant incision.  All skin incisions were closed with subcuticular sutures of 4-0 Vicryl. LiquiBand was then placed over the incisions and honeycomb dressings were placed over the umbilical and left lower quadrant trocar sites.  The Hulka tenaculum was removed from the cervix and a Foley catheter was removed from the bladder.  The patient was cleansed of Betadine.  She was awakened and  extubated, and escorted to the recovery room in stable condition.  There were no complications to the procedure.  All needle, instrument, and sponge counts were correct.     Randye LoboBrook E. Silva, M.D.     BES/MEDQ  D:  07/21/2015  T:  07/22/2015  Job:  [id number]

## 2015-07-23 ENCOUNTER — Encounter: Payer: Self-pay | Admitting: Obstetrics and Gynecology

## 2015-07-23 ENCOUNTER — Ambulatory Visit (INDEPENDENT_AMBULATORY_CARE_PROVIDER_SITE_OTHER): Payer: Commercial Managed Care - HMO | Admitting: Obstetrics and Gynecology

## 2015-07-23 ENCOUNTER — Telehealth: Payer: Self-pay | Admitting: Obstetrics and Gynecology

## 2015-07-23 VITALS — BP 120/78 | HR 84 | Ht 64.0 in | Wt 175.2 lb

## 2015-07-23 DIAGNOSIS — R3911 Hesitancy of micturition: Secondary | ICD-10-CM

## 2015-07-23 NOTE — Telephone Encounter (Signed)
Patient had surgery on Tuesday 07/21/15 and is having difficulties using the restroom. She thinks she may have a UTI.

## 2015-07-23 NOTE — Progress Notes (Signed)
Patient ID: Ann Whitehead, female   DOB: 29-Aug-1980, 34 y.o.   MRN: [id number] GYNECOLOGY  VISIT   HPI: 34 y.o.   Married  Caucasian  female   G1P1001 with Patient's last menstrual period was 07/12/2015 (approximate).   here to be evaluated for decreased urinary output following surgery--2 days post op.  Patient states she is very distended but only voids small amounts when urinates.  She states she had this problem after her cesarean also. Husband present for the entire visit today.  He works for [employer].  Status post laparoscopic left paraovarian cyst excision and distal salpingectomy, right tubal ligation with cautery, fulguration of endometriosis.  Path report - benign paraovarian cyst, benign distal tube, and peritoneum negative for endometriosis.   No fevers, nausea, or vomiting.  Taking Percocet, 1 during the day and 2 at night for pain.  GYNECOLOGIC HISTORY: Patient's last menstrual period was 07/12/2015 (approximate). Contraception:Rhythm Menopausal hormone therapy: n/a  Last mammogram: n/a Last pap smear: 10-04-13 Neg:Neg HR HPV        OB History    Gravida Para Term Preterm AB TAB SAB Ectopic Multiple Living   '1 1 1       1         ' There are no active problems to display for this patient.   Past Medical History  Diagnosis Date  . STD (sexually transmitted disease)     HX HSV    Past Surgical History  Procedure Laterality Date  . Cholecystectomy  2004  . Appendectomy  2004  . Cesarean section  2003  . Augmentation mammaplasty  2011  . Laparoscopic tubal ligation Right 07/21/2015    Procedure: LAPAROSCOPIC TUBAL LIGATION;  Surgeon: Nunzio Cobbs, MD;  Location: [facility];  Service: Gynecology;  Laterality: Right;  . Ablation on endometriosis N/A 07/21/2015    Procedure: FULGERATION OF ENDOMETRIOSIS, BIOPSY OF POSTERIOR CUL DE [place];  Surgeon: Nunzio Cobbs, MD;  Location: [facility];  Service: Gynecology;  Laterality: N/A;  . Ovarian cyst removal  Left 07/21/2015    Procedure: EXCISION PARA-OVARIAN CYST;  Surgeon: Nunzio Cobbs, MD;  Location: [facility];  Service: Gynecology;  Laterality: Left;  . Unilateral salpingectomy Left 07/21/2015    Procedure: DISTAL UNILATERAL SALPINGECTOMY;  Surgeon: Nunzio Cobbs, MD;  Location: [facility];  Service: Gynecology;  Laterality: Left;    Current Outpatient Prescriptions  Medication Sig Dispense Refill  . ibuprofen (ADVIL,MOTRIN) 800 MG tablet Take 1 tablet (800 mg total) by mouth every 8 (eight) hours as needed. 30 tablet 0  . oxyCODONE-acetaminophen (PERCOCET) 5-325 MG tablet Take 1-2 tablets by mouth every 4 (four) hours as needed. use only as much as needed to relieve pain 30 tablet 0   No current facility-administered medications for this visit.     ALLERGIES: Review of patient's allergies indicates no known allergies.  Family History  Problem Relation Age of Onset  . Colon polyps Mother     multiple pre-cancerous polyps on several colonoscopies  . Diabetes Father   . Hypertension Father   . Hyperlipidemia Father   . Diabetes Paternal Grandmother   . Hyperlipidemia Paternal Grandmother   . Hypertension Paternal Grandmother     Social History   Social History  . Marital Status: Married    Spouse Name: N/A  . Number of Children: N/A  . Years of Education: N/A   Occupational History  . Not on file.   Social  History Main Topics  . Smoking status: Never Smoker   . Smokeless tobacco: Not on file  . Alcohol Use: 1.8 oz/week    3 Standard drinks or equivalent per week  . Drug Use: No  . Sexual Activity:    Partners: Male    Birth Control/ Protection: Surgical     Comment: Tubal   Other Topics Concern  . Not on file   Social History Narrative    ROS:  Pertinent items are noted in HPI.  PHYSICAL EXAMINATION:    BP 120/78 mmHg  Pulse 84  Ht '5\' 4"'  (1.626 m)  Wt 175 lb 3.2 oz (79.47 kg)  BMI 30.06 kg/m2  LMP 07/12/2015 (Approximate)    General  appearance: alert, cooperative and appears stated age Lungs: clear to auscultation bilaterally   Heart: regular rate and rhythm Abdomen: incisions intact, soft, non-tender; bowel sounds normal; no masses,  no organomegaly   Pelvic: External genitalia:  no lesions              Urethra:  normal appearing urethra with no masses, tenderness or lesions              Bartholins and Skenes: normal              Bimanual Exam:  Uterus:  normal size, contour, position, consistency, mobility, non-tender              Adnexa: no mass or fullness, slight bilateral tenderness.               Bladder catheterization.  Verbal consent for procedure.  Sterile prep of urethra with betadine.  Cath tip placed from cath kit - clear, light yellow urine noted, 25 cc.  Urine dip - negative.   Chaperone was present for exam.  ASSESSMENT  Status post laparoscopic left paraovarian cyst excision and distal salpingectomy, right tubal ligation with cautery, fulguration of endometriosis. Urinary hesitancy. No evidence of urinary retention or UTI.   PLAN  Counseled regarding  No evidence of urinary retention.  I believe the patient's discomfort just reflects her post op pain.  I cautioned her that the narcotics can cause urinary retention as a side effect, so only take what is needed.  Call if having any recurrent problems.  Pathology report discussed. Otherwise, return for post op visit in about 1 month.  An After Visit Summary was printed and given to the patient.  ___15___ minutes face to face time of which over 50% was spent in counseling.

## 2015-07-23 NOTE — Telephone Encounter (Signed)
Spoke with patient. Addendum made to previous telephone note. Please see telephone encounter dated 07/22/215.

## 2015-07-23 NOTE — Telephone Encounter (Signed)
Patient returned call. Would like to be seen in the office today. Spoke with Billie RuddySally Yeakley, RN. Patient to come now to office for work in with Dr.Silva. Patient will leave now and can be to the office by 3 pm. Appointment scheduled.  Routing to provider as Lorain ChildesFYI. Encounter previously closed.

## 2015-07-23 NOTE — Telephone Encounter (Signed)
Spoke with patient. Patient had surgery with Dr.Silva on 07/21/2015. Patient states that since 12/06 she has only been able to void "small trickles" when going to the restroom. Patient is drinking plenty of fluids.Denies any current pain/discomfort,fevers, burning with urination,or chills. Has the sensation that her bladder is full but states when she goes to the restroom it no longer feels full. Advised patient she will need to be seen in the office for further evaluation today. Patient declines. "I do not have anyone to bring me today. It has to be tomorrow." Advised this is very important. Patient again declines.Offered early morning appointment for tomorrow, but patient declines due to her husband working night shift. Appointment scheduled for 07/24/2015 at 1 pm with Dr.Silva. Advised if she becomes unable to void at all, has any discomfort/pain, fever, or chills will need [facility]. Patient is agreeable.  Routing to provider for final review. Patient agreeable to disposition. Will close encounter.

## 2015-07-23 NOTE — Telephone Encounter (Signed)
Patient said she is returning a call to Kaitlyn. No open phone note? °

## 2015-07-24 ENCOUNTER — Ambulatory Visit: Payer: Commercial Managed Care - HMO | Admitting: Obstetrics and Gynecology

## 2015-07-27 ENCOUNTER — Telehealth: Payer: Self-pay | Admitting: Obstetrics and Gynecology

## 2015-07-27 MED ORDER — TRAMADOL HCL 50 MG PO TABS: 50.0000 mg | ORAL_TABLET | Freq: Four times a day (QID) | ORAL | Status: DC | PRN

## 2015-07-27 NOTE — Telephone Encounter (Signed)
Spoke with patient. She is at work today, first day back after surgical procedure. She c/o continuing LLQ pain and nausea. Rates pain right now as 4/10, describes as constant ache. Pain is relieved when resting.  Took Motrin 800 mg po at 0800. She states she is eating and drinking like normal. Last normal BM was yesterday per patient. Last took Percocet on Saturday morning and states she did not want to call Dr. Edward JollySilva over the weekend due to pain. Patient denies vaginal bleeding, but notes small amounts of blood on tissue after voiding. No pain with urination. Patient states she is not requesting a refill of percocet but states she needs "something stronger than Motrin."   Advised patient will send message to Dr. Edward JollySilva and return call. Patient agreeable.

## 2015-07-27 NOTE — Telephone Encounter (Signed)
I will see patient on 12/16 for her appointment.  No further recommendations at this time.  Thank you.

## 2015-07-27 NOTE — Telephone Encounter (Signed)
Call to patient with message from Dr. Edward JollySilva.  Patient agreeable to plan from Dr. Edward JollySilva. She states she is feeling areas where skin is warm to touch on L side incision. She denies redness, swelling or drainage from site. She states that dermabond is still in place. Advised patient that it is important for her to be seen for evaluation if she is having pain on L side and now feels skin is warm to touch in area of pain. Patient states she is at work and cannot miss any work.  She scheduled an office visit for follow up on 07/31/15 at 1300 and she is advised to call back or seek emergency care if symptoms increase or if she develops any problems with incision or fevers, nausea, vomiting or  pain. Patient aware that our recommendation is to be seen for office visit today and sooner than Friday. Patient states she will monitor her symptoms and return call with any concerns or if she can make it to an earlier appointment.  Rx for Tramadol faxed to [facility] in [place] per patient request.

## 2015-07-27 NOTE — Telephone Encounter (Addendum)
Patient called and said, "I had surgery last week but am continuing to have pain that the ibuprofen won't touch. I'd like to speak with the nurse." No paper chart in file.

## 2015-07-27 NOTE — Telephone Encounter (Signed)
Patient may try Tramadol 50 mg po q 6 hours prn pain.  #20, RF none. Please fax to pharmacy of choice.  I think patient needs a visit with me sometime this week.

## 2015-07-28 NOTE — Telephone Encounter (Signed)
Noted message from Dr. Silva.  Will close encounter.   

## 2015-07-30 ENCOUNTER — Telehealth: Payer: Self-pay | Admitting: Obstetrics and Gynecology

## 2015-07-30 NOTE — Telephone Encounter (Signed)
Patient left a message on our voicemail this morning to cancel appointment for tomorrow 07/31/15 (post op pain follow up). She said that she will just keep her appointment on 08/21/15 instead.

## 2015-07-30 NOTE — Telephone Encounter (Signed)
I am ok with having her wait until January for her post op visit.  I have closed the encounter.

## 2015-07-31 ENCOUNTER — Ambulatory Visit: Payer: Commercial Managed Care - HMO | Admitting: Obstetrics and Gynecology

## 2015-08-21 ENCOUNTER — Encounter: Payer: Self-pay | Admitting: Obstetrics and Gynecology

## 2015-08-21 ENCOUNTER — Ambulatory Visit (INDEPENDENT_AMBULATORY_CARE_PROVIDER_SITE_OTHER): Payer: Commercial Managed Care - HMO | Admitting: Obstetrics and Gynecology

## 2015-08-21 VITALS — BP 128/82 | HR 70 | Resp 16 | Ht 64.0 in | Wt 170.2 lb

## 2015-08-21 DIAGNOSIS — Z9889 Other specified postprocedural states: Secondary | ICD-10-CM

## 2015-08-21 NOTE — Progress Notes (Signed)
Patient ID: Ann Whitehead, female   DOB: 23-Jun-1981, 35 y.o.   MRN: [id number] GYNECOLOGY  VISIT   HPI: 35 y.o.   Married  Caucasian  female   G1P1001 with Patient's last menstrual period was 08/08/2015 (exact date).   here for 4 week post op check.  LAPAROSCOPIC TUBAL LIGATION (Right Abdomen)  FULGERATION OF ENDOMETRIOSIS, BIOPSY OF POSTERIOR CUL DE SAC (N/A Abdomen)  EXCISION PARA-OVARIAN CYST (Left Abdomen) [[id number] CPT (R) DISTAL UNILATERAL SALPINGECTOMY (Left Abdomen).   Patient states first menses after surgery was very heavy and painful. Having painful ovulation.    GYNECOLOGIC HISTORY: Patient's last menstrual period was 08/08/2015 (exact date). Contraception: Tubal Menopausal hormone therapy: n/a Last mammogram: n/a Last pap smear: 10-04-13 Neg:Neg HR HPV        OB History    Gravida Para Term Preterm AB TAB SAB Ectopic Multiple Living   1 1 1       1          There are no active problems to display for this patient.   Past Medical History  Diagnosis Date  . STD (sexually transmitted disease)     HX HSV    Past Surgical History  Procedure Laterality Date  . Cholecystectomy  2004  . Appendectomy  2004  . Cesarean section  2003  . Augmentation mammaplasty  2011  . Laparoscopic tubal ligation Right 07/21/2015    Procedure: LAPAROSCOPIC TUBAL LIGATION;  Surgeon: Patton SallesBrook E Amundson C Silva, MD;  Location: [facility];  Service: Gynecology;  Laterality: Right;  . Ablation on endometriosis N/A 07/21/2015    Procedure: FULGERATION OF ENDOMETRIOSIS, BIOPSY OF POSTERIOR CUL DE SAC;  Surgeon: Patton SallesBrook E Amundson C Silva, MD;  Location: [facility];  Service: Gynecology;  Laterality: N/A;  . Ovarian cyst removal Left 07/21/2015    Procedure: EXCISION PARA-OVARIAN CYST;  Surgeon: Patton SallesBrook E Amundson C Silva, MD;  Location: [facility];  Service: Gynecology;  Laterality: Left;  . Unilateral salpingectomy Left 07/21/2015    Procedure: DISTAL UNILATERAL SALPINGECTOMY;  Surgeon: Patton SallesBrook E Amundson C  Silva, MD;  Location: [facility];  Service: Gynecology;  Laterality: Left;    Current Outpatient Prescriptions  Medication Sig Dispense Refill  . ibuprofen (ADVIL,MOTRIN) 800 MG tablet Take 1 tablet (800 mg total) by mouth every 8 (eight) hours as needed. 30 tablet 0   No current facility-administered medications for this visit.     ALLERGIES: Review of patient's allergies indicates no known allergies.  Family History  Problem Relation Age of Onset  . Colon polyps Mother     multiple pre-cancerous polyps on several colonoscopies  . Diabetes Father   . Hypertension Father   . Hyperlipidemia Father   . Diabetes Paternal Grandmother   . Hyperlipidemia Paternal Grandmother   . Hypertension Paternal Grandmother     Social History   Social History  . Marital Status: Married    Spouse Name: N/A  . Number of Children: N/A  . Years of Education: N/A   Occupational History  . Not on file.   Social History Main Topics  . Smoking status: Never Smoker   . Smokeless tobacco: Not on file  . Alcohol Use: 1.8 oz/week    3 Standard drinks or equivalent per week  . Drug Use: No  . Sexual Activity:    Partners: Male    Birth Control/ Protection: Surgical     Comment: Tubal   Other Topics Concern  . Not on file   Social History Narrative  ROS:  Pertinent items are noted in HPI.  PHYSICAL EXAMINATION:    BP 128/82 mmHg  Pulse 70  Resp 16  Ht 5\' 4"  (1.626 m)  Wt 170 lb 3.2 oz (77.202 kg)  BMI 29.20 kg/m2  LMP 08/08/2015 (Exact Date)    General appearance: alert, cooperative and appears stated age Abdomen: incisions intact, soft, non-tender;   no masses,  no organomegaly   Pelvic: External genitalia:  no lesions              Urethra:  normal appearing urethra with no masses, tenderness or lesions              Bartholins and Skenes: normal                 Vagina: normal appearing vagina with normal color and discharge, no lesions              Cervix: no lesions             Bimanual Exam:  Uterus:  normal size, contour, position, consistency, mobility, non-tender              Adnexa: normal adnexa and no mass, fullness, tenderness      Chaperone was present for exam.  ASSESSMENT  Status post LAPAROSCOPIC TUBAL LIGATION (Right Abdomen)  FULGERATION OF ENDOMETRIOSIS, BIOPSY OF POSTERIOR CUL DE SAC (N/A Abdomen)  EXCISION PARA-OVARIAN CYST (Left Abdomen) [[id number] CPT (R) DISTAL UNILATERAL SALPINGECTOMY (Left Abdomen).  Dysmenorrhea with first menses post op.  PLAN   Discussion of endometriosis - signs and symptoms, treatment options.  Will do observational management at this time. Ok to increase activity gradually including resuming sexual activity.  Follow up for annual exam in March 2017.   An After Visit Summary was printed and given to the patient.  __15____ minutes face to face time of which over 50% was spent in counseling.

## 2015-09-17 ENCOUNTER — Encounter: Payer: Self-pay | Admitting: Obstetrics and Gynecology

## 2015-09-17 ENCOUNTER — Telehealth: Payer: Self-pay | Admitting: Emergency Medicine

## 2015-09-17 NOTE — Telephone Encounter (Signed)
Chief Complaint  Patient presents with  . Advice Only    Patient sent mychart message with request for refill of Ultram    ===View-only below this line===   ----- Message -----    From: Ann Whitehead    Sent: 09/17/2015  7:46 AM EST      To: Melony Overly, MD Subject: Non-Urgent Medical Question  When I was seen in the office for my follow up in Jan I mentioned to her that I had painful ovulation. I am now currently ovulating again and the pain is intense. It woke me up in the middle of the night. She ha given me some Ultram after my surgery, I have taken the 2 leftover ones of those because  ibuprofen provides no relief. I was wondering if she could refill the Ultram for me for ovulation times? thanks

## 2015-09-17 NOTE — Telephone Encounter (Signed)
Patient needs an office visit for evaluation.  I do not usually give prescriptions for narcotics or narcotic equivalent to control monthly menstrual or ovulatory pain.  These medications are strong and controlled substances.  She may be having some symptoms related to endometriosis also. There are alternatives we can discuss at a visit.

## 2015-09-17 NOTE — Telephone Encounter (Signed)
Responded to patient via mychart and asked her to call the office.  Message left to return call to [place] at [phone].

## 2015-09-17 NOTE — Telephone Encounter (Signed)
Call to patient and she is given message from Dr. Edward Jolly.  She verbalizes understanding.  She declines to schedule an appointment at this time,. She states she will call back tomorrow to schedule.  Advised her to ask for a nurse so that appointment can be scheduled with Dr. Edward Jolly by triage.  She is also advised to call back with any concerning or worsening symptoms.

## 2015-09-17 NOTE — Telephone Encounter (Signed)
Telephone call for triage created to discuss message with patient and disposition as appropriate.   

## 2015-09-17 NOTE — Telephone Encounter (Signed)
Patient returned call. She states she is having pain with ovulation and discussed this is with Dr. Edward Jolly at post op appointment 08/21/15. She states that feels mid pelvic pain (not on one certain side) with ovulation. She states that this pain woke her up in the middle of the night last night. She took 1 tablet of ultram 50 mg and it did help with the pain. Today, she is taking Motrin 800 mg q 8 hours while she is at work and states that pain is 7/10 with motrin but she is able to work. No bowel or bladder changes, no vaginal discharge or vaginal bleeding, no fevers or chills. Able to eat and drink like normal. She states it feels the same as it did previously.  Declines office visit at this time, requests refill of Ultram to Dr. Edward Jolly.  Advised will send Dr. Edward Jolly request.  Patient requests return call and detailed message be left on her voicemail.

## 2015-10-16 ENCOUNTER — Ambulatory Visit (INDEPENDENT_AMBULATORY_CARE_PROVIDER_SITE_OTHER): Payer: Commercial Managed Care - HMO | Admitting: Obstetrics and Gynecology

## 2015-10-16 ENCOUNTER — Encounter: Payer: Self-pay | Admitting: Obstetrics and Gynecology

## 2015-10-16 VITALS — BP 118/78 | HR 80 | Resp 18 | Ht 64.0 in | Wt 171.4 lb

## 2015-10-16 DIAGNOSIS — Z0000 Encounter for general adult medical examination without abnormal findings: Secondary | ICD-10-CM | POA: Diagnosis not present

## 2015-10-16 DIAGNOSIS — Z01419 Encounter for gynecological examination (general) (routine) without abnormal findings: Secondary | ICD-10-CM | POA: Diagnosis not present

## 2015-10-16 LAB — COMPREHENSIVE METABOLIC PANEL
ALBUMIN: 4.2 g/dL (ref 3.6–5.1)
ALK PHOS: 47 U/L (ref 33–115)
ALT: 8 U/L (ref 6–29)
AST: 14 U/L (ref 10–30)
BUN: 11 mg/dL (ref 7–25)
CALCIUM: 9.4 mg/dL (ref 8.6–10.2)
CO2: 26 mmol/L (ref 20–31)
Chloride: 103 mmol/L (ref 98–110)
Creat: 0.72 mg/dL (ref 0.50–1.10)
Glucose, Bld: 84 mg/dL (ref 65–99)
POTASSIUM: 4 mmol/L (ref 3.5–5.3)
Sodium: 137 mmol/L (ref 135–146)
TOTAL PROTEIN: 7.5 g/dL (ref 6.1–8.1)
Total Bilirubin: 0.3 mg/dL (ref 0.2–1.2)

## 2015-10-16 LAB — LIPID PANEL
CHOL/HDL RATIO: 4 ratio (ref <=5.0)
Cholesterol: 188 mg/dL (ref 125–200)
HDL: 47 mg/dL (ref >=46)
LDL CALC: 122 mg/dL (ref <130)
Triglycerides: 93 mg/dL (ref <150)
VLDL: 19 mg/dL (ref <30)

## 2015-10-16 LAB — POCT URINALYSIS DIPSTICK
Bilirubin, UA: NEGATIVE
GLUCOSE UA: NEGATIVE
KETONES UA: NEGATIVE
Leukocytes, UA: NEGATIVE
Nitrite, UA: NEGATIVE
PH UA: 5
Protein, UA: NEGATIVE
Urobilinogen, UA: NEGATIVE

## 2015-10-16 LAB — TSH: TSH: 2.19 m[IU]/L

## 2015-10-16 LAB — CBC
HEMATOCRIT: 37.6 % (ref 36.0–46.0)
HEMOGLOBIN: 12 g/dL (ref 12.0–15.0)
MCH: 29.1 pg (ref 26.0–34.0)
MCHC: 31.9 g/dL (ref 30.0–36.0)
MCV: 91 fL (ref 78.0–100.0)
MPV: 9.5 fL (ref 8.6–12.4)
Platelets: 404 10*3/uL — ABNORMAL HIGH (ref 150–400)
RBC: 4.13 MIL/uL (ref 3.87–5.11)
RDW: 12.8 % (ref 11.5–15.5)
WBC: 8.2 10*3/uL (ref 4.0–10.5)

## 2015-10-16 LAB — HEMOGLOBIN, FINGERSTICK: Hemoglobin, fingerstick: 11.5 g/dL — ABNORMAL LOW (ref 12.0–16.0)

## 2015-10-16 NOTE — Patient Instructions (Signed)

## 2015-10-16 NOTE — Progress Notes (Addendum)
Patient ID: Ann Whitehead, female   DOB: 07-Aug-1981, 35 y.o.   MRN: [id number] 35 y.o. [id number] Married Caucasian female here for annual exam.    Menses are crampy for first 1 - 2 days.  Takes Motrin 800 mg with beginning of cycle and with ovulation.  Adds Tylenol Extra strength to this.  Took a left over Tramadol from surgery.  Doing OK for now.   PCP:  Gerre PebblesSally Davis, PA-C  Patient's last menstrual period was 10/07/2015 (exact date).          Sexually active: Yes.   female The current method of family planning is tubal ligation.    Exercising: No.   Smoker:  no  Health Maintenance: Pap:  10-04-13 Neg:Neg HR HPV History of abnormal Pap:  no MMG:  n/a Colonoscopy:  n/a BMD:   n/a  Result  n/a TDaP:  10-10-14 Screening Labs:  Hb today: 11.5, Urine today: Trace RBCs--asymptomatic   reports that she has never smoked. She does not have any smokeless tobacco history on file. She reports that she drinks about 1.2 oz of alcohol per week. She reports that she does not use illicit drugs.  Past Medical History  Diagnosis Date  . STD (sexually transmitted disease)     HX HSV    Past Surgical History  Procedure Laterality Date  . Cholecystectomy  2004  . Appendectomy  2004  . Cesarean section  2003  . Augmentation mammaplasty  2011  . Laparoscopic tubal ligation Right 07/21/2015    Procedure: LAPAROSCOPIC TUBAL LIGATION;  Surgeon: Patton SallesBrook E Amundson C Silva, MD;  Location: [facility];  Service: Gynecology;  Laterality: Right;  . Ablation on endometriosis N/A 07/21/2015    Procedure: FULGERATION OF ENDOMETRIOSIS, BIOPSY OF POSTERIOR CUL DE SAC;  Surgeon: Patton SallesBrook E Amundson C Silva, MD;  Location: [facility];  Service: Gynecology;  Laterality: N/A;  . Ovarian cyst removal Left 07/21/2015    Procedure: EXCISION PARA-OVARIAN CYST;  Surgeon: Patton SallesBrook E Amundson C Silva, MD;  Location: [facility];  Service: Gynecology;  Laterality: Left;  . Unilateral salpingectomy Left 07/21/2015    Procedure: DISTAL UNILATERAL  SALPINGECTOMY;  Surgeon: Patton SallesBrook E Amundson C Silva, MD;  Location: [facility];  Service: Gynecology;  Laterality: Left;    Current Outpatient Prescriptions  Medication Sig Dispense Refill  . ibuprofen (ADVIL,MOTRIN) 800 MG tablet Take 1 tablet (800 mg total) by mouth every 8 (eight) hours as needed. 30 tablet 0   No current facility-administered medications for this visit.    Family History  Problem Relation Age of Onset  . Colon polyps Mother     multiple pre-cancerous polyps on several colonoscopies  . Diabetes Father   . Hypertension Father   . Hyperlipidemia Father   . Diabetes Paternal Grandmother   . Hyperlipidemia Paternal Grandmother   . Hypertension Paternal Grandmother     ROS:  Pertinent items are noted in HPI.  Otherwise, a comprehensive ROS was negative.  Exam:   BP 118/78 mmHg  Pulse 80  Resp 18  Ht 5\' 4"  (1.626 m)  Wt 171 lb 6.4 oz (77.747 kg)  BMI 29.41 kg/m2  LMP 10/07/2015 (Exact Date)    General appearance: alert, cooperative and appears stated age Head: Normocephalic, without obvious abnormality, atraumatic Neck: no adenopathy, supple, symmetrical, trachea midline and thyroid normal to inspection and palpation Lungs: clear to auscultation bilaterally Breasts: normal appearance, no masses or tenderness, Inspection negative, No nipple retraction or dimpling, No nipple discharge or bleeding, No axillary  or supraclavicular adenopathy.  Bilateral breast implants. Heart: regular rate and rhythm Abdomen: soft, non-tender; bowel sounds normal; no masses,  no organomegaly Extremities: extremities normal, atraumatic, no cyanosis or edema Skin: Skin color, texture, turgor normal. No rashes or lesions Lymph nodes: Cervical, supraclavicular, and axillary nodes normal. No abnormal inguinal nodes palpated Neurologic: Grossly normal  Pelvic: External genitalia:  no lesions              Urethra:  normal appearing urethra with no masses, tenderness or lesions               Bartholins and Skenes: normal                 Vagina: normal appearing vagina with normal color and discharge, no lesions              Cervix: no lesions and mild bloody mucous from cervix.               Pap taken: Yes.   Bimanual Exam:  Uterus:  normal size, contour, position, consistency, mobility, non-tender              Adnexa: normal adnexa and no mass, fullness, tenderness              Rectovaginal: No..  Confirms.              Anus:  normal sphincter tone, no lesions  Chaperone was present for exam.  Assessment:   Well woman visit with normal exam. Dysmenorrhea.  Endometriosis by surgical evaluation at time of tubal ligation.   Plan: Yearly mammogram recommended after age 55.  Recommended self breast exam.  Pap and HR HPV as above. Discussed Calcium, Vitamin D, regular exercise program including cardiovascular and weight bearing exercise. Labs performed.  Yes.  .   See orders.   Routine labs. Refills given on medications.  No..  Discussed return to oral contraception, ring, patch, or Mirena IUD in future if needed. Follow up annually and prn.      After visit summary provided.

## 2015-10-20 LAB — IPS PAP TEST WITH HPV

## 2016-09-27 DIAGNOSIS — R5381 Other malaise: Secondary | ICD-10-CM | POA: Diagnosis not present

## 2016-10-13 DIAGNOSIS — R7401 Elevation of levels of liver transaminase levels: Secondary | ICD-10-CM

## 2016-10-13 HISTORY — DX: Elevation of levels of liver transaminase levels: R74.01

## 2016-11-03 NOTE — Progress Notes (Signed)
36 y.o. 391P1001 Married Caucasian female here for annual exam.    Lost 30 pounds through dietary change and exercise.  Hair feels thinner and bruises more easily.   Pain with intercourse occasionally near cycle time.  Cramps with menses for the first two days.  Manageable with ibuprofen and Tylenol.  No outbreaks unless flying somewhere.  Not on suppressive antiviral.  Uses only prn.   PCP:  Gerre PebblesSally Davis, PA w/[facility]   Patient's last menstrual period was 10/31/2016.     Period Cycle (Days): 28 Period Duration (Days): 5-7  Period Pattern: Regular Menstrual Flow: Heavy, Light Menstrual Control: Tampon Menstrual Control Change Freq (Hours): Has a lot of clotting Dysmenorrhea: (!) Mild Dysmenorrhea Symptoms: Cramping     Sexually active: Yes.    The current method of family planning is tubal ligation.    Exercising: Yes.    Weights, Cardio Smoker:  no  Health Maintenance: Pap: 10-16-15 Neg:Neg HR HPV;10-04-13 Neg:Neg HR HPV History of abnormal Pap:  no MMG:  n/a Colonoscopy:  n/a BMD:   n/a  Result  n/a TDaP:  10-10-14 Gardasil:   no HIV: 2014 Hep C: 2014 Screening Labs: blood drawn today. Patient is fasting.    reports that she has never smoked. She has never used smokeless tobacco. She reports that she drinks about 1.2 oz of alcohol per week . She reports that she does not use drugs.  Past Medical History:  Diagnosis Date  . STD (sexually transmitted disease)    HX HSV    Past Surgical History:  Procedure Laterality Date  . ABLATION ON ENDOMETRIOSIS N/A 07/21/2015   Procedure: FULGERATION OF ENDOMETRIOSIS, BIOPSY OF POSTERIOR CUL DE SAC;  Surgeon: Patton SallesBrook E Amundson C Silva, MD;  Location: [facility];  Service: Gynecology;  Laterality: N/A;  . APPENDECTOMY  2004  . AUGMENTATION MAMMAPLASTY  2011  . CESAREAN SECTION  2003  . CHOLECYSTECTOMY  2004  . LAPAROSCOPIC TUBAL LIGATION Right 07/21/2015   Procedure: LAPAROSCOPIC TUBAL LIGATION;  Surgeon: Patton SallesBrook E  Amundson C Silva, MD;  Location: [facility];  Service: Gynecology;  Laterality: Right;  . OVARIAN CYST REMOVAL Left 07/21/2015   Procedure: EXCISION PARA-OVARIAN CYST;  Surgeon: Patton SallesBrook E Amundson C Silva, MD;  Location: [facility];  Service: Gynecology;  Laterality: Left;  . UNILATERAL SALPINGECTOMY Left 07/21/2015   Procedure: DISTAL UNILATERAL SALPINGECTOMY;  Surgeon: Patton SallesBrook E Amundson C Silva, MD;  Location: [facility];  Service: Gynecology;  Laterality: Left;    Current Outpatient Prescriptions  Medication Sig Dispense Refill  . Multiple Vitamins-Minerals (MULTI ADULT GUMMIES) CHEW      No current facility-administered medications for this visit.     Family History  Problem Relation Age of Onset  . Colon polyps Mother     multiple pre-cancerous polyps on several colonoscopies  . Diabetes Father   . Hypertension Father   . Hyperlipidemia Father   . Diabetes Paternal Grandmother   . Hyperlipidemia Paternal Grandmother   . Hypertension Paternal Grandmother     ROS:  Pertinent items are noted in HPI.  Otherwise, a comprehensive ROS was negative.  Exam:   BP 112/80 (BP Location: Right Arm, Patient Position: Sitting, Cuff Size: Normal)   Pulse 76   Resp 14   Ht 5' 4.25" (1.632 m)   Wt 142 lb (64.4 kg)   LMP 10/31/2016   BMI 24.18 kg/m     General appearance: alert, cooperative and appears stated age Head: Normocephalic, without obvious abnormality, atraumatic Neck: no  adenopathy, supple, symmetrical, trachea midline and thyroid normal to inspection and palpation Lungs: clear to auscultation bilaterally Breasts: bilateral implants, no masses or tenderness, No nipple retraction or dimpling, No nipple discharge or bleeding, No axillary or supraclavicular adenopathy Heart: regular rate and rhythm Abdomen: soft, non-tender; no masses, no organomegaly Extremities: extremities normal, atraumatic, no cyanosis or edema Skin: Skin color, texture, turgor normal. No rashes or lesions Lymph nodes:  Cervical, supraclavicular, and axillary nodes normal. No abnormal inguinal nodes palpated Neurologic: Grossly normal  Pelvic: External genitalia:  no lesions              Urethra:  normal appearing urethra with no masses, tenderness or lesions              Bartholins and Skenes: normal                 Vagina: normal appearing vagina with normal color and discharge, no lesions              Cervix: no lesions              Pap taken: No. Bimanual Exam:  Uterus:  normal size, contour, position, consistency, mobility, non-tender              Adnexa: no mass, fullness, tenderness               Chaperone was present for exam.  Assessment:   Well woman visit with normal exam. Status post BTL. Hx HSV.  Hx endometriosis. Hair thinning.  Weight loss, intentional.  Plan: Mammogram screening discussed. Recommended self breast awareness. Pap and HR HPV as above. Guidelines for Calcium, Vitamin D, regular exercise program including cardiovascular and weight bearing exercise. Refill for Valtrex to take prn.  Routine labs. Follow up annually and prn.       After visit summary provided.

## 2016-11-04 ENCOUNTER — Encounter: Payer: Self-pay | Admitting: Obstetrics and Gynecology

## 2016-11-04 ENCOUNTER — Ambulatory Visit (INDEPENDENT_AMBULATORY_CARE_PROVIDER_SITE_OTHER): Payer: Commercial Managed Care - HMO | Admitting: Obstetrics and Gynecology

## 2016-11-04 VITALS — BP 112/80 | HR 76 | Resp 14 | Ht 64.25 in | Wt 142.0 lb

## 2016-11-04 DIAGNOSIS — Z01419 Encounter for gynecological examination (general) (routine) without abnormal findings: Secondary | ICD-10-CM | POA: Diagnosis not present

## 2016-11-04 LAB — COMPREHENSIVE METABOLIC PANEL
ALBUMIN: 4 g/dL (ref 3.6–5.1)
ALT: 28 U/L (ref 6–29)
AST: 93 U/L — AB (ref 10–30)
Alkaline Phosphatase: 41 U/L (ref 33–115)
BILIRUBIN TOTAL: 0.2 mg/dL (ref 0.2–1.2)
BUN: 9 mg/dL (ref 7–25)
CHLORIDE: 105 mmol/L (ref 98–110)
CO2: 28 mmol/L (ref 20–31)
Calcium: 9.2 mg/dL (ref 8.6–10.2)
Creat: 0.73 mg/dL (ref 0.50–1.10)
Glucose, Bld: 87 mg/dL (ref 65–99)
Potassium: 4.3 mmol/L (ref 3.5–5.3)
Sodium: 140 mmol/L (ref 135–146)
TOTAL PROTEIN: 6.7 g/dL (ref 6.1–8.1)

## 2016-11-04 LAB — CBC
HEMATOCRIT: 36 % (ref 35.0–45.0)
HEMOGLOBIN: 11.6 g/dL — AB (ref 11.7–15.5)
MCH: 30.1 pg (ref 27.0–33.0)
MCHC: 32.2 g/dL (ref 32.0–36.0)
MCV: 93.3 fL (ref 80.0–100.0)
MPV: 9.3 fL (ref 7.5–12.5)
Platelets: 383 10*3/uL (ref 140–400)
RBC: 3.86 MIL/uL (ref 3.80–5.10)
RDW: 12.8 % (ref 11.0–15.0)
WBC: 7.3 10*3/uL (ref 3.8–10.8)

## 2016-11-04 LAB — LIPID PANEL
CHOL/HDL RATIO: 2.7 ratio (ref <5.0)
CHOLESTEROL: 126 mg/dL (ref <200)
HDL: 46 mg/dL — ABNORMAL LOW (ref >50)
LDL Cholesterol: 70 mg/dL (ref <100)
TRIGLYCERIDES: 52 mg/dL (ref <150)
VLDL: 10 mg/dL (ref <30)

## 2016-11-04 LAB — TSH: TSH: 1.63 mIU/L

## 2016-11-04 MED ORDER — VALACYCLOVIR HCL 500 MG PO TABS: 500.0000 mg | ORAL_TABLET | Freq: Two times a day (BID) | ORAL | 0 refills | Status: DC

## 2016-11-04 NOTE — Patient Instructions (Signed)

## 2016-11-05 ENCOUNTER — Encounter: Payer: Self-pay | Admitting: Obstetrics and Gynecology

## 2016-11-05 LAB — VITAMIN D 25 HYDROXY (VIT D DEFICIENCY, FRACTURES): Vit D, 25-Hydroxy: 34 ng/mL (ref 30–100)

## 2016-11-07 ENCOUNTER — Telehealth: Payer: Self-pay | Admitting: *Deleted

## 2016-11-07 NOTE — Telephone Encounter (Signed)
-----   Message from Patton SallesBrook E Amundson C Silva, MD sent at 11/05/2016  6:26 PM EDT ----- Please contact patient in follow up to her lab results. Her cholesterol panel shows a low HDL cholesterol, which is the good cholesterol. She can lower this through exercise.  The rest of the cholesterol panel looked good.  Her blood chemistries showed an elevated liver enzyme called AST.  I am recommending that this be further evaluated through her PCP. Her CBC showed a slightly low hemoglobin.  She may want to switch from her current MVI to one with a small amount of added iron.  Her TSH and vit D were normal.

## 2016-11-07 NOTE — Telephone Encounter (Signed)
Patient returned call

## 2016-11-07 NOTE — Telephone Encounter (Signed)
Unable to reach patient, rang multiple times then into busy signal, unable to leave message.

## 2016-11-07 NOTE — Telephone Encounter (Signed)
Spoke with patient, advised of results and recommendations as seen below per Dr. Edward JollySilva. Verified pcp on file. Patient would like copy of all labs dated 11/04/16 faxed. Patient verbalizes understanding and is agreeable.  Routing to provider for final review. Patient is agreeable to disposition. Will close encounter.  Verbal request for ROI completed.  Routing to provider for final review. Patient is agreeable to disposition. Will close encounter.

## 2016-11-09 DIAGNOSIS — R799 Abnormal finding of blood chemistry, unspecified: Secondary | ICD-10-CM | POA: Diagnosis not present

## 2016-11-09 DIAGNOSIS — R740 Nonspecific elevation of levels of transaminase and lactic acid dehydrogenase [LDH]: Secondary | ICD-10-CM | POA: Diagnosis not present

## 2016-12-02 DIAGNOSIS — R740 Nonspecific elevation of levels of transaminase and lactic acid dehydrogenase [LDH]: Secondary | ICD-10-CM | POA: Diagnosis not present

## 2017-01-11 ENCOUNTER — Other Ambulatory Visit: Payer: Self-pay | Admitting: Obstetrics and Gynecology

## 2017-01-11 NOTE — Telephone Encounter (Signed)
Medication refill request: Valacyclovir Last AEX:  11/04/16 BS Next AEX: 11/24/17 BS Last MMG (if hormonal medication request): n/a Refill authorized: 11/04/16 #30 0R. Please advise. Thank you.

## 2017-01-19 DIAGNOSIS — J0100 Acute maxillary sinusitis, unspecified: Secondary | ICD-10-CM | POA: Diagnosis not present

## 2017-01-26 DIAGNOSIS — N390 Urinary tract infection, site not specified: Secondary | ICD-10-CM | POA: Diagnosis not present

## 2017-04-10 ENCOUNTER — Other Ambulatory Visit: Payer: Self-pay | Admitting: Obstetrics and Gynecology

## 2017-04-10 NOTE — Telephone Encounter (Signed)
Medication refill request: Valtrex  Last AEX:  11-04-16  Next AEX: 11-24-17  Last MMG (if hormonal medication request): N/A Refill authorized: please advise

## 2017-07-25 ENCOUNTER — Other Ambulatory Visit: Payer: Self-pay | Admitting: Obstetrics and Gynecology

## 2017-07-25 NOTE — Telephone Encounter (Signed)
Medication refill request: valACYclovir Last AEX:  11/04/16 BS Next AEX: 11/24/17 Last MMG (if hormonal medication request): n/a Refill authorized: 04/10/17 #30 w/1refill; today please advise

## 2017-08-16 ENCOUNTER — Other Ambulatory Visit: Payer: Self-pay | Admitting: Obstetrics and Gynecology

## 2017-08-16 NOTE — Telephone Encounter (Signed)
Medication refill request: Valtrex Last AEX:  11/04/16 BS Next AEX: 11/24/17 Last MMG (if hormonal medication request): n/a Refill authorized: 07/26/17 #30 w/0 refills; today please advise

## 2017-08-24 ENCOUNTER — Other Ambulatory Visit: Payer: Self-pay | Admitting: Obstetrics & Gynecology

## 2017-08-24 NOTE — Telephone Encounter (Signed)
Message left to return call to Lessie Funderburke at [phone].    

## 2017-08-24 NOTE — Telephone Encounter (Signed)
Patient returned call. Patient states she does not need valtrex refilled at this time, as she has not even picked up the original prescription dated 08/16/17. RN advised would update pharmacy.

## 2017-09-06 ENCOUNTER — Other Ambulatory Visit: Payer: Self-pay | Admitting: Obstetrics and Gynecology

## 2017-09-06 NOTE — Telephone Encounter (Signed)
Please call the patient and check on her. If she has used 30 valtrex in 3 weeks she may need to consider daily therapy.

## 2017-09-06 NOTE — Telephone Encounter (Signed)
Medication refill request: Valtrex  Last AEX:  11-04-16  Next AEX: 11-24-17  Last MMG (if hormonal medication request): N/A Refill authorized: please advise  

## 2017-09-06 NOTE — Telephone Encounter (Signed)
Spoke with patient and she is going to be loosing her insurance at the end of the month and she just wanted to get a refill just in case -eh

## 2017-09-06 NOTE — Telephone Encounter (Signed)
Left message for patient to call regarding refill request.

## 2017-11-24 ENCOUNTER — Ambulatory Visit: Payer: Commercial Managed Care - HMO | Admitting: Obstetrics and Gynecology

## 2018-01-26 ENCOUNTER — Ambulatory Visit (INDEPENDENT_AMBULATORY_CARE_PROVIDER_SITE_OTHER): Payer: 59 | Admitting: Obstetrics and Gynecology

## 2018-01-26 ENCOUNTER — Encounter: Payer: Self-pay | Admitting: Obstetrics and Gynecology

## 2018-01-26 ENCOUNTER — Other Ambulatory Visit (HOSPITAL_COMMUNITY)
Admission: RE | Admit: 2018-01-26 | Discharge: 2018-01-26 | Disposition: A | Discharge status: Home / Self Care | Payer: 59 | Source: Ambulatory Visit | Attending: Obstetrics and Gynecology | Admitting: Obstetrics and Gynecology

## 2018-01-26 ENCOUNTER — Other Ambulatory Visit: Payer: Self-pay

## 2018-01-26 VITALS — BP 100/68 | HR 76 | Resp 16 | Ht 64.0 in | Wt 127.0 lb

## 2018-01-26 DIAGNOSIS — Z01419 Encounter for gynecological examination (general) (routine) without abnormal findings: Secondary | ICD-10-CM | POA: Diagnosis not present

## 2018-01-26 MED ORDER — VALACYCLOVIR HCL 500 MG PO TABS: ORAL_TABLET | ORAL | 3 refills | Status: DC

## 2018-01-26 NOTE — Progress Notes (Signed)
37 y.o. 431P1001 Legally Separated Caucasian female here for annual exam.    Menses last 7 - 9 days.  Heavy and cramping at first.  Uses Ibuprofen and Tylenol for pain.  Declines treatment for this.   In a new relationship.  Declines STD testing.  Would like to take Valtrex daily to reduce risk of outbreaks.  Wants blood work done today.  She had a hepatitis panel last year after having elevated liver function.  This was all normal and LFTs returned to normal.  Lost 60 pounds.   New work doing [employer].  Separated from her husband.   PCP: Gerre PebblesSally Davis, PA    Patient's last menstrual period was 01/16/2018.           Sexually active: Yes.    The current method of family planning is tubal ligation.    Exercising: Yes.    weight training and cardio Smoker:  no  Health Maintenance: Pap:  10-16-15 Neg:Neg HR HPV History of abnormal Pap:  no TDaP:  2016 Gardasil:   no HIV: 2014 Hep C: 2014 Screening Labs:  Discuss today   reports that she has never smoked. She has never used smokeless tobacco. She reports that she drinks about 1.2 oz of alcohol per week. She reports that she does not use drugs.  Past Medical History:  Diagnosis Date  . Elevated AST (SGOT) 10/2016  . STD (sexually transmitted disease)    HX HSV    Past Surgical History:  Procedure Laterality Date  . ABLATION ON ENDOMETRIOSIS N/A 07/21/2015   Procedure: FULGERATION OF ENDOMETRIOSIS, BIOPSY OF POSTERIOR CUL DE SAC;  Surgeon: Patton SallesBrook E Amundson C Silva, MD;  Location: [facility];  Service: Gynecology;  Laterality: N/A;  . APPENDECTOMY  2004  . AUGMENTATION MAMMAPLASTY  2011  . CESAREAN SECTION  2003  . CHOLECYSTECTOMY  2004  . LAPAROSCOPIC TUBAL LIGATION Right 07/21/2015   Procedure: LAPAROSCOPIC TUBAL LIGATION;  Surgeon: Patton SallesBrook E Amundson C Silva, MD;  Location: [facility];  Service: Gynecology;  Laterality: Right;  . OVARIAN CYST REMOVAL Left 07/21/2015   Procedure: EXCISION PARA-OVARIAN CYST;  Surgeon: Patton SallesBrook E  Amundson C Silva, MD;  Location: [facility];  Service: Gynecology;  Laterality: Left;  . UNILATERAL SALPINGECTOMY Left 07/21/2015   Procedure: DISTAL UNILATERAL SALPINGECTOMY;  Surgeon: Patton SallesBrook E Amundson C Silva, MD;  Location: [facility];  Service: Gynecology;  Laterality: Left;    Current Outpatient Medications  Medication Sig Dispense Refill  . Multiple Vitamins-Minerals (MULTI ADULT GUMMIES) CHEW     . valACYclovir (VALTREX) 500 MG tablet TAKE 1 TABLET BY MOUTH TWICE A DAY . TAKE FOR 3-5 DAYS AS NEEDED FOR OUTBREAK 30 tablet 0   No current facility-administered medications for this visit.     Family History  Problem Relation Age of Onset  . Colon polyps Mother        multiple pre-cancerous polyps on several colonoscopies  . Diabetes Father   . Hypertension Father   . Hyperlipidemia Father   . Diabetes Paternal Grandmother   . Hyperlipidemia Paternal Grandmother   . Hypertension Paternal Grandmother     Review of Systems  Constitutional: Negative.   HENT: Negative.   Eyes: Negative.   Respiratory: Negative.   Cardiovascular: Negative.   Gastrointestinal: Negative.   Endocrine: Negative.   Genitourinary: Negative.   Musculoskeletal: Negative.   Skin: Negative.   Allergic/Immunologic: Negative.   Neurological: Negative.   Hematological: Negative.   Psychiatric/Behavioral: Negative.     Exam:  BP 100/68 (BP Location: Right Arm, Patient Position: Sitting, Cuff Size: Normal)   Pulse 76   Resp 16   Ht 5\' 4"  (1.626 m)   Wt 127 lb (57.6 kg)   LMP 01/16/2018   BMI 21.80 kg/m     General appearance: alert, cooperative and appears stated age Head: Normocephalic, without obvious abnormality, atraumatic Neck: no adenopathy, supple, symmetrical, trachea midline and thyroid normal to inspection and palpation Lungs: clear to auscultation bilaterally Breasts: bilateral implants, normal appearance, no masses or tenderness, No nipple retraction or dimpling, No nipple discharge or  bleeding, No axillary or supraclavicular adenopathy Heart: regular rate and rhythm Abdomen: soft, non-tender; no masses, no organomegaly Extremities: extremities normal, atraumatic, no cyanosis or edema Skin: Skin color, texture, turgor normal. No rashes or lesions Lymph nodes: Cervical, supraclavicular, and axillary nodes normal. No abnormal inguinal nodes palpated Neurologic: Grossly normal  Pelvic: External genitalia:  no lesions              Urethra:  normal appearing urethra with no masses, tenderness or lesions              Bartholins and Skenes: normal                 Vagina: normal appearing vagina with normal color and discharge, no lesions              Cervix: no lesions              Pap taken: Yes.   Bimanual Exam:  Uterus:  normal size, contour, position, consistency, mobility, non-tender              Adnexa: no mass, fullness, tenderness  Chaperone was present for exam.  Assessment:   Well woman visit with normal exam. Status post BTL. Hx HSV.  Hx endometriosis. Status post bilateral breast augmentation.   Plan: Mammogram screening age 86.  Recommended self breast awareness. Pap and HR HPV as above. Guidelines for Calcium, Vitamin D, regular exercise program including cardiovascular and weight bearing exercise. Valtrex 500 mg daily.  Routine labs. Follow up annually and prn.      After visit summary provided.

## 2018-01-26 NOTE — Patient Instructions (Signed)

## 2018-01-27 LAB — COMPREHENSIVE METABOLIC PANEL
A/G RATIO: 1.9 (ref 1.2–2.2)
ALT: 9 IU/L (ref 0–32)
AST: 13 IU/L (ref 0–40)
Albumin: 4.7 g/dL (ref 3.5–5.5)
Alkaline Phosphatase: 37 IU/L — ABNORMAL LOW (ref 39–117)
BUN/Creatinine Ratio: 13 (ref 9–23)
BUN: 11 mg/dL (ref 6–20)
Bilirubin Total: 0.3 mg/dL (ref 0.0–1.2)
CALCIUM: 9.6 mg/dL (ref 8.7–10.2)
CO2: 24 mmol/L (ref 20–29)
CREATININE: 0.86 mg/dL (ref 0.57–1.00)
Chloride: 103 mmol/L (ref 96–106)
GFR, EST AFRICAN AMERICAN: 102 mL/min/{1.73_m2} (ref >59)
GFR, EST NON AFRICAN AMERICAN: 88 mL/min/{1.73_m2} (ref >59)
Globulin, Total: 2.5 g/dL (ref 1.5–4.5)
Glucose: 83 mg/dL (ref 65–99)
POTASSIUM: 3.7 mmol/L (ref 3.5–5.2)
Sodium: 141 mmol/L (ref 134–144)
TOTAL PROTEIN: 7.2 g/dL (ref 6.0–8.5)

## 2018-01-27 LAB — CBC
Hematocrit: 37.3 % (ref 34.0–46.6)
Hemoglobin: 12 g/dL (ref 11.1–15.9)
MCH: 30.9 pg (ref 26.6–33.0)
MCHC: 32.2 g/dL (ref 31.5–35.7)
MCV: 96 fL (ref 79–97)
PLATELETS: 380 10*3/uL (ref 150–450)
RBC: 3.88 x10E6/uL (ref 3.77–5.28)
RDW: 13.3 % (ref 12.3–15.4)
WBC: 8.2 10*3/uL (ref 3.4–10.8)

## 2018-01-27 LAB — LIPID PANEL
Chol/HDL Ratio: 2.7 ratio (ref 0.0–4.4)
Cholesterol, Total: 142 mg/dL (ref 100–199)
HDL: 52 mg/dL (ref >39)
LDL Calculated: 81 mg/dL (ref 0–99)
Triglycerides: 43 mg/dL (ref 0–149)
VLDL CHOLESTEROL CAL: 9 mg/dL (ref 5–40)

## 2018-01-27 LAB — TSH: TSH: 1.34 u[IU]/mL (ref 0.450–4.500)

## 2018-01-27 LAB — VITAMIN D 25 HYDROXY (VIT D DEFICIENCY, FRACTURES): Vit D, 25-Hydroxy: 39.7 ng/mL (ref 30.0–100.0)

## 2018-01-29 LAB — CYTOLOGY - PAP
ADEQUACY: ABSENT — AB
DIAGNOSIS: UNDETERMINED — AB
HPV (WINDOPATH): DETECTED — AB

## 2018-01-30 ENCOUNTER — Telehealth: Payer: Self-pay

## 2018-01-30 NOTE — Telephone Encounter (Signed)
-----   Message from Patton SallesBrook E Amundson C Silva, MD sent at 01/30/2018 12:01 AM EDT ----- Please contact patient with results.  Her pap shows ASCUS and her HR HPV status is positive.  I am recommending a colposcopy.  Please send to precert and schedule with me.  Contraception is BTL.  Her blood chemistries are normal except her alkaline phosphatase which is 2 points below normal.  A low level is not concerning, but a high level would be more significant.  The following test results are normal: complete blood counts cholesterol panel vitamin D thyroid

## 2018-01-30 NOTE — Telephone Encounter (Signed)
Patient returned call. Can come in on Friday June 28th if that date is available.

## 2018-01-30 NOTE — Telephone Encounter (Signed)
Left message to call Kaitlyn at [phone]. 

## 2018-01-30 NOTE — Telephone Encounter (Signed)
Spoke with patient. Colposcopy scheduled for 03/09/2018 at 3:30 pm with Dr.Silva. Patient declines all appointments offered before then due to her schedule.   Instructions given. Motrin 800 mg po x , one hour before appointment with food. Make sure to eat a meal before appointment and drink plenty of fluids. Patient verbalized understanding and will call to reschedule if will be on menses.Patient agreeable and verbalized understanding of all instructions.   Routing to provider for final review. Patient agreeable to disposition. Will close encounter.

## 2018-01-30 NOTE — Telephone Encounter (Signed)
Spoke with patient. Advised of all results as seen below from Dr.Silva. Patient verbalizes understanding. Patient's cycle just ended. States she has limited days off work. Requesting appointment on July 3rd. Unsure when she will be able to be seen other than this date. Advised will review with Dr.Silva and return call.  Dr.Silva, please review scheduling. No open slots July 3rd at this time.

## 2018-02-21 ENCOUNTER — Other Ambulatory Visit: Payer: Self-pay | Admitting: *Deleted

## 2018-02-21 DIAGNOSIS — R87610 Atypical squamous cells of undetermined significance on cytologic smear of cervix (ASC-US): Secondary | ICD-10-CM

## 2018-02-21 DIAGNOSIS — R87810 Cervical high risk human papillomavirus (HPV) DNA test positive: Principal | ICD-10-CM

## 2018-02-22 ENCOUNTER — Telehealth: Payer: Self-pay | Admitting: Obstetrics and Gynecology

## 2018-02-22 NOTE — Telephone Encounter (Signed)
Call placed to convey benefits for procedure appointment.

## 2018-03-09 ENCOUNTER — Other Ambulatory Visit: Payer: Self-pay

## 2018-03-09 ENCOUNTER — Encounter: Payer: Self-pay | Admitting: Obstetrics and Gynecology

## 2018-03-09 ENCOUNTER — Ambulatory Visit (INDEPENDENT_AMBULATORY_CARE_PROVIDER_SITE_OTHER): Payer: 59 | Admitting: Obstetrics and Gynecology

## 2018-03-09 ENCOUNTER — Other Ambulatory Visit (HOSPITAL_COMMUNITY)
Admission: RE | Admit: 2018-03-09 | Discharge: 2018-03-09 | Disposition: A | Discharge status: Home / Self Care | Payer: 59 | Source: Ambulatory Visit | Attending: Obstetrics and Gynecology | Admitting: Obstetrics and Gynecology

## 2018-03-09 VITALS — BP 108/62 | HR 64 | Resp 12 | Ht 64.0 in | Wt 127.0 lb

## 2018-03-09 DIAGNOSIS — Z113 Encounter for screening for infections with a predominantly sexual mode of transmission: Secondary | ICD-10-CM | POA: Insufficient documentation

## 2018-03-09 DIAGNOSIS — Z01812 Encounter for preprocedural laboratory examination: Secondary | ICD-10-CM

## 2018-03-09 DIAGNOSIS — R87610 Atypical squamous cells of undetermined significance on cytologic smear of cervix (ASC-US): Secondary | ICD-10-CM

## 2018-03-09 DIAGNOSIS — R87810 Cervical high risk human papillomavirus (HPV) DNA test positive: Secondary | ICD-10-CM | POA: Diagnosis not present

## 2018-03-09 LAB — POCT URINE PREGNANCY: PREG TEST UR: NEGATIVE

## 2018-03-09 NOTE — Patient Instructions (Signed)
Colposcopy, Care After  This sheet gives you information about how to care for yourself after your procedure. Your doctor may also give you more specific instructions. If you have problems or questions, contact your doctor.  What can I expect after the procedure?  If you did not have a tissue sample removed (did not have a biopsy), you may only have some spotting for a few days. You can go back to your normal activities.  If you had a tissue sample removed, it is common to have:  · Soreness and pain. This may last for a few days.  · Light-headedness.  · Mild bleeding from your vagina or dark-colored, grainy discharge from your vagina. This may last for a few days. You may need to wear a sanitary pad.  · Spotting for at least 48 hours after the procedure.    Follow these instructions at home:  · Take over-the-counter and prescription medicines only as told by your doctor. Ask your doctor what medicines you can start taking again. This is very important if you take blood-thinning medicine.  · Do not drive or use heavy machinery while taking prescription pain medicine.  · For 3 days, or as long as your doctor tells you, avoid:  ? Douching.  ? Using tampons.  ? Having sex.  · If you use birth control (contraception), keep using it.  · Limit activity for the first day after the procedure. Ask your doctor what activities are safe for you.  · It is up to you to get the results of your procedure. Ask your doctor when your results will be ready.  · Keep all follow-up visits as told by your doctor. This is important.  Contact a doctor if:  · You get a skin rash.  Get help right away if:  · You are bleeding a lot from your vagina. It is a lot of bleeding if you are using more than one pad an hour for 2 hours in a row.  · You have clumps of blood (blood clots) coming from your vagina.  · You have a fever.  · You have chills  · You have pain in your lower belly (pelvic area).  · You have signs of infection, such as vaginal  discharge that is:  ? Different than usual.  ? Yellow.  ? Bad-smelling.  · You have very pain or cramps in your lower belly that do not get better with medicine.  · You feel light-headed.  · You feel dizzy.  · You pass out (faint).  Summary  · If you did not have a tissue sample removed (did not have a biopsy), you may only have some spotting for a few days. You can go back to your normal activities.  · If you had a tissue sample removed, it is common to have mild pain and spotting for 48 hours.  · For 3 days, or as long as your doctor tells you, avoid douching, using tampons and having sex.  · Get help right away if you have bleeding, very bad pain, or signs of infection.  This information is not intended to replace advice given to you by your health care provider. Make sure you discuss any questions you have with your health care provider.  Document Released: 01/18/2008 Document Revised: 04/20/2016 Document Reviewed: 04/20/2016  Elsevier Interactive Patient Education © 2018 [employer]

## 2018-03-09 NOTE — Progress Notes (Signed)
  Subjective:     Patient ID: Ann Whitehead, female   DOB: 10/15/1980, 37 y.o.   MRN: [id number]  HPI Pap History:  01/26/18 ASCUS:Pos HR HPV 10-16-15 Neg:Neg HR HPV 10/04/13 Neg:Neg HR HPV  Review of Systems  All other systems reviewed and are negative.  LMP: 02/19/18 Contraception: tubal ligation UPT: negative    Objective:   Physical Exam  Genitourinary:    Colposcopy Consent for procedure. GC/CT/trichomonas testing collected. 3% acetic acid used and white light and green light filter used.  Colposcopy is satisfactory.  Subtle raised acetowhite change just outside the cervical os at 9:00. Upper vaginal no lesions seen. ECC taken and sent to pathology.  Biopsy at 9:00 taken and sent to pathology. Minimal EBL.  Monsel's applied.  No complications.      Assessment:     ASCUS pap and positive HR HPV.  STD screening.    Plan:     We discussed abnormal paps and HPV.  FU biopsies.  We discussed HPV vaccine.  I anticipate a pap and HR HPV testing in one year. STD screening today.  After visit summary to patient.

## 2018-03-10 LAB — HEP, RPR, HIV PANEL
HIV Screen 4th Generation wRfx: NONREACTIVE
Hepatitis B Surface Ag: NEGATIVE
RPR Ser Ql: NONREACTIVE

## 2018-03-10 LAB — HEPATITIS C ANTIBODY

## 2018-03-12 ENCOUNTER — Telehealth: Payer: Self-pay | Admitting: Obstetrics and Gynecology

## 2018-03-12 ENCOUNTER — Encounter: Payer: Self-pay | Admitting: Obstetrics and Gynecology

## 2018-03-12 LAB — CERVICOVAGINAL ANCILLARY ONLY
Chlamydia: NEGATIVE
NEISSERIA GONORRHEA: NEGATIVE
TRICH (WINDOWPATH): NEGATIVE

## 2018-03-12 NOTE — Telephone Encounter (Signed)
Spoke with patient, asking if colpo results are available to include GC/Chlamydia?  Advised patient results have not been resulted, can take up to 7 days for colpo results. Advised our office will return call to notify once completed.   Routing to provider for final review. Patient is agreeable to disposition. Will close encounter.

## 2018-03-12 NOTE — Telephone Encounter (Signed)
Patient sent the following correspondence through MyChart. Routing to triage to assist patient with request.  Message   ----- Message from Mychart, Generic sent at 03/12/2018 1:52 PM EDT -----    How long should it take to receive the rest of the testing that was done Friday?   Patient had a colposcopy on Friday, 03/09/18.

## 2018-03-16 ENCOUNTER — Telehealth: Payer: Self-pay

## 2018-03-16 NOTE — Telephone Encounter (Signed)
Spoke with patient. Results given. Patient verbalizes understanding. 08 recall entered. Encounter closed. 

## 2018-03-16 NOTE — Telephone Encounter (Signed)
Patient returned call to Kaitlyn. °

## 2018-03-16 NOTE — Telephone Encounter (Signed)
Left message to call Kaitlyn at [phone].  Notes recorded by Patton SallesAmundson C Silva, Brook E, MD on 03/15/2018 at 11:12 PM EDT Please inform of colposcopic biopsy results which are normal.  Pap recall - 08 due to pap showing atypia and positive HR HPV.

## 2018-08-09 DIAGNOSIS — J018 Other acute sinusitis: Secondary | ICD-10-CM | POA: Diagnosis not present

## 2018-08-20 DIAGNOSIS — H905 Unspecified sensorineural hearing loss: Secondary | ICD-10-CM | POA: Insufficient documentation

## 2018-08-20 DIAGNOSIS — H9041 Sensorineural hearing loss, unilateral, right ear, with unrestricted hearing on the contralateral side: Secondary | ICD-10-CM | POA: Diagnosis not present

## 2018-08-20 DIAGNOSIS — H9311 Tinnitus, right ear: Secondary | ICD-10-CM | POA: Diagnosis not present

## 2018-08-20 DIAGNOSIS — R42 Dizziness and giddiness: Secondary | ICD-10-CM | POA: Diagnosis not present

## 2018-08-27 DIAGNOSIS — H9121 Sudden idiopathic hearing loss, right ear: Secondary | ICD-10-CM | POA: Diagnosis not present

## 2018-08-27 DIAGNOSIS — H9041 Sensorineural hearing loss, unilateral, right ear, with unrestricted hearing on the contralateral side: Secondary | ICD-10-CM | POA: Diagnosis not present

## 2018-08-27 DIAGNOSIS — R42 Dizziness and giddiness: Secondary | ICD-10-CM | POA: Diagnosis not present

## 2018-08-27 DIAGNOSIS — H9191 Unspecified hearing loss, right ear: Secondary | ICD-10-CM | POA: Diagnosis not present

## 2018-10-10 ENCOUNTER — Telehealth: Payer: Self-pay | Admitting: Obstetrics and Gynecology

## 2018-10-10 NOTE — Telephone Encounter (Signed)
Patient is asking to talk to Dr.Silva's nurse about having her cycle for over two weeks.

## 2018-10-10 NOTE — Telephone Encounter (Signed)
Spoke with patient.  Started menses on 09/27/2018. Continues cycle today. Changes pad q 4 hours. No pain.  She states typically her cycle lasts 5-7 days and then she is done.  Hx BTL. No dizziness, weakness or lightheaded feelings.  Declines appointment today. Works out of town. Office visit with Dr. Edward Jolly tomorrow per her request.   I advised patient to keep calendar of menses, call back if bleeding continues or is soaking pads or if any abdominal pain or cramping. Advised to complete home pregnancy test.  Call our office back with results of home pregnancy test, if positive. I advised I would call back if additional instructions from provider. Advised can take 600-800 mg Motrin q 8 hours for up to 48 hours to help decrease bleeding. She verbalizes understanding of symptoms of bleeding emergencies and when to call back to office or go to urgent care or emergency room as needed. Patient is agreeable and verbalizes understanding of plan and will call back if condition changes or worsens.   Routing to Dr. Edward Jolly to advise if okay to close triage encounter.

## 2018-10-11 ENCOUNTER — Ambulatory Visit: Payer: 59 | Admitting: Obstetrics and Gynecology

## 2018-10-11 ENCOUNTER — Encounter: Payer: Self-pay | Admitting: Obstetrics and Gynecology

## 2018-10-11 VITALS — BP 122/68 | HR 76 | Resp 16 | Ht 64.0 in | Wt 126.0 lb

## 2018-10-11 DIAGNOSIS — N926 Irregular menstruation, unspecified: Secondary | ICD-10-CM | POA: Diagnosis not present

## 2018-10-11 LAB — POCT URINE PREGNANCY: PREG TEST UR: NEGATIVE

## 2018-10-11 MED ORDER — MEDROXYPROGESTERONE ACETATE 10 MG PO TABS: 10.0000 mg | ORAL_TABLET | Freq: Every day | ORAL | 0 refills | Status: DC

## 2018-10-11 NOTE — Telephone Encounter (Signed)
Phone message reviewed and encounter closed.

## 2018-10-11 NOTE — Progress Notes (Signed)
GYNECOLOGY  VISIT   HPI: 38 y.o.   Legally Separated  Caucasian  female   G1P1001 with Patient's last menstrual period was 09/27/2018.   here for irregular cycle    Menses lasting for 2 weeks this month.  Super tampon change three times a day.  Large clots.  Some twinges of lower back pain.  Always tired, but no change from her baseline.  Menses last month was normal.   Was on prednisone 08/14/18 until mid January.  She was treated for sudden hearing loss.   No partner changes.   No bleeding or pain with intercourse.   Weight is stable.    Has abnormal bleeding happen about 9 years ago and she had an ovarian cyst.   UPT: negative  GYNECOLOGIC HISTORY: Patient's last menstrual period was 09/27/2018. Contraception:  Tubal ligation Menopausal hormone therapy:  n/a Last mammogram:  n/a Last pap smear:  03/09/18 Colposcopy biopsies were normal 01/26/18 ASCUS:Pos HR HPV 10-16-15 Neg:Neg HR HPV 10/04/13 Neg:Neg HR HPV        OB History    Gravida  1   Para  1   Term  1   Preterm      AB      Living  1     SAB      TAB      Ectopic      Multiple      Live Births                 There are no active problems to display for this patient.   Past Medical History:  Diagnosis Date  . Acute hearing loss   . Elevated AST (SGOT) 10/2016  . STD (sexually transmitted disease)    HX HSV    Past Surgical History:  Procedure Laterality Date  . ABLATION ON ENDOMETRIOSIS N/A 07/21/2015   Procedure: FULGERATION OF ENDOMETRIOSIS, BIOPSY OF POSTERIOR CUL DE SAC;  Surgeon: Patton Salles, MD;  Location: [facility];  Service: Gynecology;  Laterality: N/A;  . APPENDECTOMY  2004  . AUGMENTATION MAMMAPLASTY  2011  . CESAREAN SECTION  2003  . CHOLECYSTECTOMY  2004  . LAPAROSCOPIC TUBAL LIGATION Right 07/21/2015   Procedure: LAPAROSCOPIC TUBAL LIGATION;  Surgeon: Patton Salles, MD;  Location: [facility];  Service: Gynecology;  Laterality: Right;  . OVARIAN  CYST REMOVAL Left 07/21/2015   Procedure: EXCISION PARA-OVARIAN CYST;  Surgeon: Patton Salles, MD;  Location: [facility];  Service: Gynecology;  Laterality: Left;  . UNILATERAL SALPINGECTOMY Left 07/21/2015   Procedure: DISTAL UNILATERAL SALPINGECTOMY;  Surgeon: Patton Salles, MD;  Location: [facility];  Service: Gynecology;  Laterality: Left;    Current Outpatient Medications  Medication Sig Dispense Refill  . ibuprofen (ADVIL,MOTRIN) 200 MG tablet Take 200 mg by mouth every 6 (six) hours as needed.    . Multiple Vitamins-Minerals (MULTI ADULT GUMMIES) CHEW     . SUMAtriptan (IMITREX) 100 MG tablet Take by mouth as needed.    . valACYclovir (VALTREX) 500 MG tablet Take one tablet by mouth daily. 90 tablet 3   No current facility-administered medications for this visit.      ALLERGIES: Patient has no known allergies.  Family History  Problem Relation Age of Onset  . Colon polyps Mother        multiple pre-cancerous polyps on several colonoscopies  . Diabetes Father   . Hypertension Father   . Hyperlipidemia Father   .  Diabetes Paternal Grandmother   . Hyperlipidemia Paternal Grandmother   . Hypertension Paternal Grandmother     Social History   Socioeconomic History  . Marital status: Legally Separated    Spouse name: Not on file  . Number of children: Not on file  . Years of education: Not on file  . Highest education level: Not on file  Occupational History  . Not on file  Social Needs  . Financial resource strain: Not on file  . Food insecurity:    Worry: Not on file    Inability: Not on file  . Transportation needs:    Medical: Not on file    Non-medical: Not on file  Tobacco Use  . Smoking status: Never Smoker  . Smokeless tobacco: Never Used  Substance and Sexual Activity  . Alcohol use: Yes    Alcohol/week: 2.0 standard drinks    Types: 2 Standard drinks or equivalent per week    Comment: 1-2 beers per week--only socially  . Drug use: No  .  Sexual activity: Yes    Partners: Male    Birth control/protection: Surgical    Comment: Tubal  Lifestyle  . Physical activity:    Days per week: Not on file    Minutes per session: Not on file  . Stress: Not on file  Relationships  . Social connections:    Talks on phone: Not on file    Gets together: Not on file    Attends religious service: Not on file    Active member of club or organization: Not on file    Attends meetings of clubs or organizations: Not on file    Relationship status: Not on file  . Intimate partner violence:    Fear of current or ex partner: Not on file    Emotionally abused: Not on file    Physically abused: Not on file    Forced sexual activity: Not on file  Other Topics Concern  . Not on file  Social History Narrative  . Not on file    Review of Systems  Constitutional: Negative.   HENT: Negative.   Eyes: Negative.   Respiratory: Negative.   Cardiovascular: Negative.   Gastrointestinal: Negative.   Endocrine: Negative.   Genitourinary:       Excess bleeding Unscheduled bleeding or spotting  Musculoskeletal: Negative.   Skin: Negative.   Allergic/Immunologic: Negative.   Neurological: Negative.   Hematological: Negative.   Psychiatric/Behavioral: Negative.     PHYSICAL EXAMINATION:    BP 122/68 (BP Location: Right Arm, Patient Position: Sitting, Cuff Size: Normal)   Pulse 76   Resp 16   Ht 5\' 4"  (1.626 m)   Wt 126 lb (57.2 kg)   LMP 09/27/2018   BMI 21.63 kg/m     General appearance: alert, cooperative and appears stated age  Pelvic: External genitalia:  no lesions              Urethra:  normal appearing urethra with no masses, tenderness or lesions              Bartholins and Skenes: normal                 Vagina: normal appearing vagina with normal color and discharge, no lesions              Cervix: no lesions.  Small amount of clot in vagina.  Bimanual Exam:  Uterus:  normal size, contour, position,  consistency, mobility, non-tender              Adnexa: no mass, fullness, tenderness.  Left ovary feels about 3 cm in size.           Chaperone was present for exam.  ASSESSMENT  Probable anovulatory bleeding.  Hx left para-ovarian cyst.  Hx endometriosis.   PLAN  Provera 10 mg x 10 days.  Discussed side effects.  Knows to expect a withdrawal bleed after finishing course. Return for continued abnormal bleeding.  Will get ultrasound if does not improve.  No CBC needed today.    An After Visit Summary was printed and given to the patient.

## 2018-10-18 ENCOUNTER — Telehealth: Payer: Self-pay | Admitting: Obstetrics and Gynecology

## 2018-10-18 ENCOUNTER — Encounter: Payer: Self-pay | Admitting: Obstetrics and Gynecology

## 2018-10-18 DIAGNOSIS — N926 Irregular menstruation, unspecified: Secondary | ICD-10-CM

## 2018-10-18 MED ORDER — MEDROXYPROGESTERONE ACETATE 10 MG PO TABS: 10.0000 mg | ORAL_TABLET | Freq: Every day | ORAL | 0 refills | Status: DC

## 2018-10-18 NOTE — Telephone Encounter (Signed)
Spoke with patient. Started Provera 10 mg daily on 2/27. Reports bleeding has slowed to intermittent spotting, but has not gone away. Patient aware to expect withdrawal bleed after completing provera, but thought bleeding would stop completely before now. Reports fatigue. Denies pain, SOB, weakness, lightheadedness, headache. Patient states PUS discussed if bleeding did not resolve. No labs on 2/27.   Advised I will review with Dr. Edward Jolly and return call with recommendations. Patient agreeable.   Dr. Edward Jolly -please advise.

## 2018-10-18 NOTE — Telephone Encounter (Signed)
Ok to extend her Provera 10 mg for another week and have her return next Thursday am for a pelvic US with me.

## 2018-10-18 NOTE — Telephone Encounter (Signed)
Left message to call Analeia Ismael, RN at [facility] [phone].   

## 2018-10-18 NOTE — Telephone Encounter (Signed)
Spoke with patient, advised as seen below per Dr. Edward Jolly. 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 Declines appt time of 1100. PUS scheduled for 3/12 at 0800, consult to follow at 0830 with Dr. Edward Jolly. Rx for Provera 10 mg PO daily #7/[facility]. Order for PUS placed for precert. Patient verbalizes understanding and is agreeable.  Routing to provider for final review. Patient is agreeable to disposition. Will close encounter.  Cc: Harland Dingwall, Sound[place] Pilon

## 2018-10-18 NOTE — Telephone Encounter (Signed)
Patient sent the following correspondence through MyChart. Routing to triage to assist patient with request.  I saw Dr. Edward Jolly last week for bleeding and was given an RX for Provera. I have 2 more days of that RX left. I was under the impression that I would not have any more bleeding after. I have been spotting still. Please let me know if this is normal or what I should do next.  Thanks

## 2018-10-23 ENCOUNTER — Telehealth: Payer: Self-pay | Admitting: Obstetrics and Gynecology

## 2018-10-23 NOTE — Telephone Encounter (Signed)
Call placed to convey benefits for ultrasound. °

## 2018-10-25 ENCOUNTER — Other Ambulatory Visit: Payer: Self-pay

## 2018-10-25 ENCOUNTER — Encounter: Payer: Self-pay | Admitting: Obstetrics and Gynecology

## 2018-10-25 ENCOUNTER — Ambulatory Visit (INDEPENDENT_AMBULATORY_CARE_PROVIDER_SITE_OTHER): Payer: 59

## 2018-10-25 ENCOUNTER — Ambulatory Visit: Payer: 59 | Admitting: Obstetrics and Gynecology

## 2018-10-25 VITALS — BP 110/60 | HR 76 | Resp 14 | Ht 63.0 in | Wt 126.4 lb

## 2018-10-25 DIAGNOSIS — R5383 Other fatigue: Secondary | ICD-10-CM

## 2018-10-25 DIAGNOSIS — N939 Abnormal uterine and vaginal bleeding, unspecified: Secondary | ICD-10-CM

## 2018-10-25 DIAGNOSIS — N926 Irregular menstruation, unspecified: Secondary | ICD-10-CM | POA: Diagnosis not present

## 2018-10-25 DIAGNOSIS — N9489 Other specified conditions associated with female genital organs and menstrual cycle: Secondary | ICD-10-CM | POA: Diagnosis not present

## 2018-10-25 NOTE — Progress Notes (Signed)
GYNECOLOGY  VISIT   HPI: 38 y.o.   Divorced  Caucasian  female   G1P1001 with Patient's last menstrual period was 09/27/2018.   here for ultrasound for ongoing vaginal bleeding.  Her partner is here with her today.   Vaginal bleeding since  09/27/18.  Has done an extended course of Provera and had not stopped bleeding.  She noted bright red bleeding and clots.  She changes a tampon every 6 hours. Took Ibuprofen for cramping 3 days ago but not since then.   Reports fatigue.  Denies headache, shortness of breath, and tachycardia.   GYNECOLOGIC HISTORY: Patient's last menstrual period was 09/27/2018. Contraception:  Tubal ligation Menopausal hormone therapy:  none Last mammogram:  n/a Last pap smear:   01/26/18 ASCUS:Pos HR HPV   03/09/18 Colposcopic biopsies were normal        OB History    Gravida  1   Para  1   Term  1   Preterm      AB      Living  1     SAB      TAB      Ectopic      Multiple      Live Births                 There are no active problems to display for this patient.   Past Medical History:  Diagnosis Date  . Acute hearing loss   . Elevated AST (SGOT) 10/2016  . STD (sexually transmitted disease)    HX HSV    Past Surgical History:  Procedure Laterality Date  . ABLATION ON ENDOMETRIOSIS N/A 07/21/2015   Procedure: FULGERATION OF ENDOMETRIOSIS, BIOPSY OF POSTERIOR CUL DE SAC;  Surgeon: Patton Salles, MD;  Location: [facility];  Service: Gynecology;  Laterality: N/A;  . APPENDECTOMY  2004  . AUGMENTATION MAMMAPLASTY  2011  . CESAREAN SECTION  2003  . CHOLECYSTECTOMY  2004  . LAPAROSCOPIC TUBAL LIGATION Right 07/21/2015   Procedure: LAPAROSCOPIC TUBAL LIGATION;  Surgeon: Patton Salles, MD;  Location: [facility];  Service: Gynecology;  Laterality: Right;  . OVARIAN CYST REMOVAL Left 07/21/2015   Procedure: EXCISION PARA-OVARIAN CYST;  Surgeon: Patton Salles, MD;  Location: [facility];  Service: Gynecology;   Laterality: Left;  . UNILATERAL SALPINGECTOMY Left 07/21/2015   Procedure: DISTAL UNILATERAL SALPINGECTOMY;  Surgeon: Patton Salles, MD;  Location: [facility];  Service: Gynecology;  Laterality: Left;    Current Outpatient Medications  Medication Sig Dispense Refill  . ibuprofen (ADVIL,MOTRIN) 200 MG tablet Take 200 mg by mouth every 6 (six) hours as needed.    . medroxyPROGESTERone (PROVERA) 10 MG tablet Take 1 tablet (10 mg total) by mouth daily. 7 tablet 0  . Multiple Vitamins-Minerals (MULTI ADULT GUMMIES) CHEW     . SUMAtriptan (IMITREX) 100 MG tablet Take by mouth as needed.    . valACYclovir (VALTREX) 500 MG tablet Take one tablet by mouth daily. 90 tablet 3   No current facility-administered medications for this visit.      ALLERGIES: Patient has no known allergies.  Family History  Problem Relation Age of Onset  . Colon polyps Mother        multiple pre-cancerous polyps on several colonoscopies  . Diabetes Father   . Hypertension Father   . Hyperlipidemia Father   . Diabetes Paternal Grandmother   . Hyperlipidemia Paternal Grandmother   . Hypertension Paternal  Grandmother     Social History   Socioeconomic History  . Marital status: Divorced    Spouse name: Not on file  . Number of children: Not on file  . Years of education: Not on file  . Highest education level: Not on file  Occupational History  . Not on file  Social Needs  . Financial resource strain: Not on file  . Food insecurity:    Worry: Not on file    Inability: Not on file  . Transportation needs:    Medical: Not on file    Non-medical: Not on file  Tobacco Use  . Smoking status: Never Smoker  . Smokeless tobacco: Never Used  Substance and Sexual Activity  . Alcohol use: Yes    Alcohol/week: 2.0 standard drinks    Types: 2 Standard drinks or equivalent per week    Comment: 1-2 beers per week--only socially  . Drug use: No  . Sexual activity: Yes    Partners: Male    Birth  control/protection: Surgical    Comment: Tubal  Lifestyle  . Physical activity:    Days per week: Not on file    Minutes per session: Not on file  . Stress: Not on file  Relationships  . Social connections:    Talks on phone: Not on file    Gets together: Not on file    Attends religious service: Not on file    Active member of club or organization: Not on file    Attends meetings of clubs or organizations: Not on file    Relationship status: Not on file  . Intimate partner violence:    Fear of current or ex partner: Not on file    Emotionally abused: Not on file    Physically abused: Not on file    Forced sexual activity: Not on file  Other Topics Concern  . Not on file  Social History Narrative  . Not on file    Review of Systems  Constitutional: Negative.   HENT: Negative.   Eyes: Negative.   Respiratory: Negative.   Cardiovascular: Negative.   Gastrointestinal: Negative.   Endocrine: Negative.   Genitourinary: Negative.   Musculoskeletal: Negative.   Skin: Negative.   Allergic/Immunologic: Negative.   Neurological: Negative.   Hematological: Negative.   Psychiatric/Behavioral: Negative.     PHYSICAL EXAMINATION:    BP 110/60 (BP Location: Right Arm, Patient Position: Sitting, Cuff Size: Normal)   Pulse 76   Resp 14   Ht 5\' 3"  (1.6 m)   Wt 126 lb 6.4 oz (57.3 kg)   LMP 09/27/2018   BMI 22.39 kg/m     General appearance: alert, cooperative and appears stated age Head: Normocephalic, without obvious abnormality, atraumatic Neck: no adenopathy, supple, symmetrical, trachea midline and thyroid normal to inspection and palpation Lungs: clear to auscultation bilaterally  Heart: regular rate and rhythm Abdomen: soft, non-tender, no masses,  no organomegaly Extremities: extremities normal, atraumatic, no cyanosis or edema No abnormal inguinal nodes palpated Neurologic: Grossly normal  Pelvic US Uterus with no fibroids.  EMS 3.37 mm, 5 mm mass present with  vascular flow. Ovaries normal.  Small left CL cyst.  No free fluid.   ASSESSMENT  Abnormal uterine bleeding.  Endometrial mass with blood supply to it, suspicious for a polyp.   Fatigue.   PLAN  TSH, CBC.  We discussed options for care: sonohysterogram, EMB, or outpatient hysteroscopy surgery. Will proceed with hysteroscopy with Myosure resection of endometrial mass, dilation and  curettage.  Risks, benefits, and alternatives reviewed. Risks include but are not limited to bleeding, infection, damage to surrounding organs including uterine perforation requiring hospitalization and laparoscopy, pulmonary edema, reaction to anesthesia, DVT, PE, death, need for further treatment . The possibility of negative findings also reviewed. Surgical expectations and recovery discussed.  Patient wishes to proceed. ACOG HO on hysteroscopy and dilation and curettage to patient.  An After Visit Summary was printed and given to the patient.  ___25___ minutes face to face time of which over 50% was spent in counseling.

## 2018-10-26 DIAGNOSIS — N939 Abnormal uterine and vaginal bleeding, unspecified: Secondary | ICD-10-CM | POA: Insufficient documentation

## 2018-10-26 DIAGNOSIS — N9489 Other specified conditions associated with female genital organs and menstrual cycle: Secondary | ICD-10-CM | POA: Insufficient documentation

## 2018-10-26 LAB — CBC
Hematocrit: 37.6 % (ref 34.0–46.6)
Hemoglobin: 12 g/dL (ref 11.1–15.9)
MCH: 31.9 pg (ref 26.6–33.0)
MCHC: 31.9 g/dL (ref 31.5–35.7)
MCV: 100 fL — ABNORMAL HIGH (ref 79–97)
PLATELETS: 370 10*3/uL (ref 150–450)
RBC: 3.76 x10E6/uL — ABNORMAL LOW (ref 3.77–5.28)
RDW: 12.2 % (ref 11.7–15.4)
WBC: 6.8 10*3/uL (ref 3.4–10.8)

## 2018-10-26 LAB — TSH: TSH: 1.65 u[IU]/mL (ref 0.450–4.500)

## 2018-10-26 NOTE — Telephone Encounter (Signed)
Patient returned call to Rosa. °

## 2018-10-26 NOTE — Telephone Encounter (Signed)
Reviewed benefits for recommended surgery. Patient understands and is agreeable. PRE CERT IS REQUIRED. Clinicals have been fax to [phone].Patient to call back to confirm and move forward with scheduling.

## 2018-10-26 NOTE — Telephone Encounter (Signed)
Call to patient. Advised of surgery date option for next week after allowing for time for insurance precert.  Patient agreeable for 11-02-2018.  Surgery instruction sheet reviewed and printed copy will be mailed with hospital brochure.   Routing to Dr Edward Jolly. Encounter closed.

## 2018-10-29 ENCOUNTER — Encounter (HOSPITAL_BASED_OUTPATIENT_CLINIC_OR_DEPARTMENT_OTHER): Payer: Self-pay

## 2018-10-29 ENCOUNTER — Other Ambulatory Visit: Payer: Self-pay

## 2018-10-29 NOTE — Progress Notes (Signed)
Spoke with:  Linette NPO:  After Midnight, no gum, candy, or mints   Arrival time:  0530AM Labs:  CBC 10/26/2018 in epic AM medications: None Pre op orders:  No left message for Kennon Rounds Ride home:  Huston Foley (boyfriend) [phone]

## 2018-10-30 NOTE — Progress Notes (Signed)
Spoke with pt.  She is not experiencing any resp symptoms not has she been around anyone who is.  She has not traveled outside of the area.  She understands to bring only one person with her to the surgery center on Friday.

## 2018-11-01 ENCOUNTER — Telehealth: Payer: Self-pay | Admitting: *Deleted

## 2018-11-01 ENCOUNTER — Encounter: Payer: Self-pay | Admitting: Obstetrics and Gynecology

## 2018-11-01 ENCOUNTER — Telehealth: Payer: Self-pay | Admitting: Obstetrics and Gynecology

## 2018-11-01 MED ORDER — ALPRAZOLAM 0.5 MG PO TABS: ORAL_TABLET | ORAL | 0 refills | Status: DC

## 2018-11-01 NOTE — Telephone Encounter (Signed)
Returned call to patient, as discussed with Kennon Rounds. I have answered all financial questions regarding surgery cancellation.    Patient is scheduled for a an appointment with Dr Edward Jolly on 11/02/18, for a possible endometrial biopsy. Patient states she is anxious about having an endometrial biopsy and has additional questions for nurse  Routing to Almedia Balls, RN  cc: Billie Ruddy, RN

## 2018-11-01 NOTE — Telephone Encounter (Signed)
Ok for Xanax 0.5 mg, take one, 1 hour prior to procedure.  Disp:  2.  RF:  None.   She will need to sign consent form in advance, and she will need a ride home.

## 2018-11-01 NOTE — Telephone Encounter (Signed)
Call to patient. Advised surgery will need to be canceled due to hospital guidelines.  Scheduled consult with Dr Edward Jolly to discuss options. Patient states not currently bleeding.Advised still needs office visit for treatment plan if/when returns. Possible endometrial biopsy to evaluate endometrial lining.  Routing to Dr Edward Jolly. Encounter closed.

## 2018-11-01 NOTE — Telephone Encounter (Signed)
Spoke with patient and message given.  Rx sent to [facility] via fax.  She will come sign her consent at 10:00, take her medicine, and then wait for appointment.  Pt agreeable. Aware she needs ride home and agreeable.  Encounter closed.

## 2018-11-01 NOTE — Telephone Encounter (Signed)
Patient called requesting to speak with Kennon Rounds, regarding additional questions about surgery cancellation  Routing to Billie Ruddy, RN

## 2018-11-01 NOTE — Telephone Encounter (Signed)
Call to patient.  Explained Endometrial biopsy process.   Patient has a ride to procedure.  Would like pre-procedure medication if possible.   Advised would send her request to Dr. Edward Jolly.   She will need to come early to appointment, sign consent, take her medicine and then wait for appointment.

## 2018-11-02 ENCOUNTER — Ambulatory Visit (HOSPITAL_BASED_OUTPATIENT_CLINIC_OR_DEPARTMENT_OTHER): Admit: 2018-11-02 | Payer: 59 | Admitting: Obstetrics and Gynecology

## 2018-11-02 ENCOUNTER — Ambulatory Visit (INDEPENDENT_AMBULATORY_CARE_PROVIDER_SITE_OTHER): Payer: 59 | Admitting: Obstetrics and Gynecology

## 2018-11-02 ENCOUNTER — Encounter: Payer: Self-pay | Admitting: Obstetrics and Gynecology

## 2018-11-02 ENCOUNTER — Other Ambulatory Visit: Payer: Self-pay

## 2018-11-02 VITALS — BP 110/62 | HR 76 | Resp 12 | Ht 63.0 in | Wt 126.0 lb

## 2018-11-02 DIAGNOSIS — N840 Polyp of corpus uteri: Secondary | ICD-10-CM | POA: Diagnosis not present

## 2018-11-02 DIAGNOSIS — N9489 Other specified conditions associated with female genital organs and menstrual cycle: Secondary | ICD-10-CM

## 2018-11-02 DIAGNOSIS — Z01812 Encounter for preprocedural laboratory examination: Secondary | ICD-10-CM | POA: Diagnosis not present

## 2018-11-02 DIAGNOSIS — N939 Abnormal uterine and vaginal bleeding, unspecified: Secondary | ICD-10-CM

## 2018-11-02 HISTORY — DX: Abnormal uterine and vaginal bleeding, unspecified: N93.9

## 2018-11-02 HISTORY — DX: Other specified conditions associated with female genital organs and menstrual cycle: N94.89

## 2018-11-02 HISTORY — DX: Migraine, unspecified, not intractable, without status migrainosus: G43.909

## 2018-11-02 HISTORY — DX: Endometriosis, unspecified: N80.9

## 2018-11-02 HISTORY — DX: Unspecified ovarian cyst, left side: N83.202

## 2018-11-02 HISTORY — DX: Tinnitus, right ear: H93.11

## 2018-11-02 HISTORY — DX: Dizziness and giddiness: R42

## 2018-11-02 LAB — POCT URINE PREGNANCY: PREG TEST UR: NEGATIVE

## 2018-11-02 SURGERY — DILATATION & CURETTAGE/HYSTEROSCOPY WITH MYOSURE
Anesthesia: Choice

## 2018-11-02 NOTE — Patient Instructions (Signed)

## 2018-11-02 NOTE — Progress Notes (Signed)
GYNECOLOGY  VISIT   HPI: 38 y.o.   Divorced  Caucasian  female   G1P1001 with Patient's last menstrual period was 09/27/2018.   here for EMB.  Has abnormal bleeding and endometrial mass.  Hysteroscopy cancelled due to COVID 19 pandemic.   Took Xanax 1.5 tablets.   UPT negative.   GYNECOLOGIC HISTORY: Patient's last menstrual period was 09/27/2018. Contraception:  Tubal ligation Menopausal hormone therapy:  none Last mammogram:  n/a Last pap smear:   01/26/18 ASCUS:Pos HR HPV   03/09/18 Colposcopic biopsies were normal        OB History    Gravida  1   Para  1   Term  1   Preterm      AB      Living  1     SAB      TAB      Ectopic      Multiple      Live Births                 Patient Active Problem List   Diagnosis Date Noted  . Endometrial mass 10/26/2018  . Abnormal uterine bleeding 10/26/2018    Past Medical History:  Diagnosis Date  . Abnormal uterine bleeding (AUB)   . Acute hearing loss    Right  . Dizziness   . Elevated AST (SGOT) 10/2016  . Endometrial mass    No biopsy as of 11/01/18.  Hysteroscopy temporarily cancelled for 11/02/18 due to coronavirus.   . Endometriosis   . Left ovarian cyst   . Migraines   . STD (sexually transmitted disease)    HX HSV  . Tinnitus, right ear     Past Surgical History:  Procedure Laterality Date  . ABLATION ON ENDOMETRIOSIS N/A 07/21/2015   Procedure: FULGERATION OF ENDOMETRIOSIS, BIOPSY OF POSTERIOR CUL DE SAC;  Surgeon: Patton Salles, MD;  Location: [facility];  Service: Gynecology;  Laterality: N/A;  . APPENDECTOMY  2004  . AUGMENTATION MAMMAPLASTY  2011  . CESAREAN SECTION  2003  . CHOLECYSTECTOMY  2004  . LAPAROSCOPIC TUBAL LIGATION Right 07/21/2015   Procedure: LAPAROSCOPIC TUBAL LIGATION;  Surgeon: Patton Salles, MD;  Location: [facility];  Service: Gynecology;  Laterality: Right;  . OVARIAN CYST REMOVAL Left 07/21/2015   Procedure: EXCISION PARA-OVARIAN CYST;  Surgeon: Patton Salles, MD;  Location: [facility];  Service: Gynecology;  Laterality: Left;  . UNILATERAL SALPINGECTOMY Left 07/21/2015   Procedure: DISTAL UNILATERAL SALPINGECTOMY;  Surgeon: Patton Salles, MD;  Location: [facility];  Service: Gynecology;  Laterality: Left;    Current Outpatient Medications  Medication Sig Dispense Refill  . ibuprofen (ADVIL,MOTRIN) 200 MG tablet Take 600-800 mg by mouth every 8 (eight) hours as needed.     . Multiple Vitamins-Minerals (MULTI ADULT GUMMIES) CHEW     . NON FORMULARY CBD oil    . SUMAtriptan (IMITREX) 100 MG tablet Take by mouth as needed.    . valACYclovir (VALTREX) 500 MG tablet Take one tablet by mouth daily. (Patient taking differently: every morning. Take one tablet by mouth daily.) 90 tablet 3   No current facility-administered medications for this visit.      ALLERGIES: Patient has no known allergies.  Family History  Problem Relation Age of Onset  . Colon polyps Mother        multiple pre-cancerous polyps on several colonoscopies  . Diabetes Father   . Hypertension Father   .  Hyperlipidemia Father   . Diabetes Paternal Grandmother   . Hyperlipidemia Paternal Grandmother   . Hypertension Paternal Grandmother     Social History   Socioeconomic History  . Marital status: Divorced    Spouse name: Not on file  . Number of children: Not on file  . Years of education: Not on file  . Highest education level: Not on file  Occupational History  . Not on file  Social Needs  . Financial resource strain: Not on file  . Food insecurity:    Worry: Not on file    Inability: Not on file  . Transportation needs:    Medical: Not on file    Non-medical: Not on file  Tobacco Use  . Smoking status: Light Tobacco Smoker    Types: Cigarettes  . Smokeless tobacco: Never Used  Substance and Sexual Activity  . Alcohol use: Yes    Alcohol/week: 2.0 standard drinks    Types: 2 Standard drinks or equivalent per week    Comment: 1-2  beers per week--only socially  . Drug use: No  . Sexual activity: Yes    Partners: Male    Birth control/protection: Surgical    Comment: Tubal  Lifestyle  . Physical activity:    Days per week: Not on file    Minutes per session: Not on file  . Stress: Not on file  Relationships  . Social connections:    Talks on phone: Not on file    Gets together: Not on file    Attends religious service: Not on file    Active member of club or organization: Not on file    Attends meetings of clubs or organizations: Not on file    Relationship status: Not on file  . Intimate partner violence:    Fear of current or ex partner: Not on file    Emotionally abused: Not on file    Physically abused: Not on file    Forced sexual activity: Not on file  Other Topics Concern  . Not on file  Social History Narrative  . Not on file    Review of Systems  Constitutional: Negative.   HENT: Negative.   Eyes: Negative.   Respiratory: Negative.   Cardiovascular: Negative.   Gastrointestinal: Negative.   Endocrine: Negative.   Genitourinary: Negative.   Musculoskeletal: Negative.   Skin: Negative.   Allergic/Immunologic: Negative.   Neurological: Negative.   Hematological: Negative.   Psychiatric/Behavioral: Negative.     PHYSICAL EXAMINATION:    BP 110/62 (BP Location: Left Arm, Patient Position: Sitting, Cuff Size: Normal)   Pulse 76   Resp 12   Ht  (1.6 m)   Wt 126 lb (57.2 kg)   LMP 09/27/2018   BMI 22.32 kg/m     General appearance: alert, cooperative and appears stated age   EMB: Consent for procedure. Sterile prep with   Paracervical block with 1% lidocaine 6 cc, lot number   [id number]  , expiration 4/23 Tenaculum to anterior cervical lip. Pipelle passed to    7.5    cm twice.   Tissue to pathology.  Minimal EBL. No complications.    Chaperone was present for exam.  ASSESSMENT  Abnormal uterine bleeding.  Endometrial mass.   PLAN  FU EMB.  Post EMB  instructions reviewed with patient.  Final plan to follow.  Expectation is that hysteroscopy can be delayed until May as long as no malignancy is identified.    An After Visit Summary  was printed and given to the patient.

## 2018-11-12 ENCOUNTER — Telehealth: Payer: Self-pay | Admitting: *Deleted

## 2018-11-12 NOTE — Telephone Encounter (Signed)
-----   Message from Patton Salles, MD sent at 11/12/2018  1:14 PM EDT ----- Please report results of EMB showing a polyp.  I spoke with the pathologist just now, who confirmed that the tissue looked like polyp. I hope she is doing Ok from a bleeding standpoint.  If she is stable, we will proceed with her hysteroscopy when surgical scheduling restrictions due to Covid 19 have been lifted.

## 2018-11-12 NOTE — Telephone Encounter (Signed)
Call to patient. Left message to call back.  

## 2018-11-13 NOTE — Telephone Encounter (Signed)
Call from patient. Advised of results and plans to proceed with surgery when hospital restrictions ( COVID 19) are lifted. Patient states bleeding is resolved at present. Advised to call back if heavy bleeding returns. We are available in office for urgent patients if any change in status.  Encounter closed.

## 2018-11-16 ENCOUNTER — Ambulatory Visit: Payer: 59 | Admitting: Obstetrics and Gynecology

## 2018-12-25 ENCOUNTER — Telehealth: Payer: Self-pay | Admitting: Obstetrics and Gynecology

## 2018-12-25 DIAGNOSIS — N939 Abnormal uterine and vaginal bleeding, unspecified: Secondary | ICD-10-CM

## 2018-12-25 DIAGNOSIS — N9489 Other specified conditions associated with female genital organs and menstrual cycle: Secondary | ICD-10-CM

## 2018-12-25 NOTE — Telephone Encounter (Signed)
Return call to patient. Aware that surgery has started to be rescheduled following Covid 19. Discussed potential date options. Patient agreeable to 01-15-2019. Advised will schedule and call patient back once confirmed.

## 2018-12-25 NOTE — Telephone Encounter (Signed)
Patient is calling regarding rescheduling her surgery that was cancelled due to COVID-19.

## 2018-12-27 NOTE — Telephone Encounter (Signed)
Call to patient. Advised of instructions from Dr Edward Jolly.  Patient has had BTSP. Menses always due mid-month.  Pelvic ultrasound with possible sonohysterogram scheduled for 02-07-19. Advised to take Motrin 800 mg one hour prior with food.  Patient took sedative prior to endometrial biopsy. Will call for this prior to appointment if she is able to be off work.  Aware will need driver and to come early to sign consent.   Encounter closed.

## 2018-12-27 NOTE — Telephone Encounter (Signed)
Please have patient return for a pelvic ultrasound, possible sonohysterogram, and office visit in a month.  We can reassess then if surgery is still needed. This will need a precert.  Thank you.

## 2018-12-27 NOTE — Telephone Encounter (Signed)
Return call to patient. States she has decided she is not comfortable proceeding with surgery.  Has decided she would like to wait till August and see if restrictions improve.  Advised there is no way to know if restrictions will improve by then, if at all, for forseable future. Covid 19 peak possible in July.  Patient states bleeding has resolved and cycles are normal. Needs to know how long can delay procedure?   Routing to Dr Edward Jolly.

## 2018-12-27 NOTE — Telephone Encounter (Signed)
Patient calling to discuss surgery reschedule.

## 2019-02-05 ENCOUNTER — Telehealth: Payer: Self-pay | Admitting: Obstetrics and Gynecology

## 2019-02-05 ENCOUNTER — Other Ambulatory Visit: Payer: Self-pay

## 2019-02-05 NOTE — Telephone Encounter (Signed)
patient is scheduled for ultrasound on Thursday and has a question about getting a prescription for something to sedate her.

## 2019-02-05 NOTE — Telephone Encounter (Signed)
Spoke with patient. Patient is scheduled for PUs, possible SHGM, on 6/25 with Dr. Quincy Simmonds. Patient asking if something for anxiety prior would be appropriate?   Explanation of PUS and SHGM provided, patient has had a PUS in the past. Recommended Motrin 800 mg with food and water one hour before procedure. Patient states she does not need any medications, "I think I will be fine, will return call if I change my mind". Advised patient if anxiety medication requetsed, will need to come into office prior to appt to sign consent and will need a driver. Patient verbalizes understanding.   Routing to provider for final review. Patient is agreeable to disposition. Will close encounter.

## 2019-02-07 ENCOUNTER — Other Ambulatory Visit: Payer: Self-pay

## 2019-02-07 ENCOUNTER — Encounter: Payer: Self-pay | Admitting: Obstetrics and Gynecology

## 2019-02-07 ENCOUNTER — Ambulatory Visit (INDEPENDENT_AMBULATORY_CARE_PROVIDER_SITE_OTHER): Payer: 59

## 2019-02-07 ENCOUNTER — Ambulatory Visit (INDEPENDENT_AMBULATORY_CARE_PROVIDER_SITE_OTHER): Payer: 59 | Admitting: Obstetrics and Gynecology

## 2019-02-07 ENCOUNTER — Other Ambulatory Visit: Payer: Self-pay | Admitting: Obstetrics and Gynecology

## 2019-02-07 VITALS — BP 112/60 | HR 72 | Temp 97.3°F | Resp 12 | Ht 63.0 in | Wt 126.0 lb

## 2019-02-07 DIAGNOSIS — N9489 Other specified conditions associated with female genital organs and menstrual cycle: Secondary | ICD-10-CM

## 2019-02-07 DIAGNOSIS — N840 Polyp of corpus uteri: Secondary | ICD-10-CM | POA: Diagnosis not present

## 2019-02-07 DIAGNOSIS — Z8742 Personal history of other diseases of the female genital tract: Secondary | ICD-10-CM

## 2019-02-07 DIAGNOSIS — B009 Herpesviral infection, unspecified: Secondary | ICD-10-CM | POA: Diagnosis not present

## 2019-02-07 DIAGNOSIS — N939 Abnormal uterine and vaginal bleeding, unspecified: Secondary | ICD-10-CM | POA: Diagnosis not present

## 2019-02-07 MED ORDER — VALACYCLOVIR HCL 500 MG PO TABS: ORAL_TABLET | ORAL | 1 refills | Status: DC

## 2019-02-07 NOTE — Progress Notes (Signed)
GYNECOLOGY  VISIT   HPI: 38 y.o.   Divorced  Caucasian  female   G1P1001 with Patient's last menstrual period was 01/27/2019.   here for ultrasound for abnormal uterine bleeding and an endometrial polyp.  This spring patient had irregular menstrual bleeding which did not resolve with progesterone treatment.  Pelvic US showed an echogenic focus and endometrial biopsy showed a polyp.  She was scheduled for a dilation and curettage, but it was cancelled due to the Covid 19 pandemic.   Her irregular vaginal bleeding has since resolved.   She states she does have heavy bleeding at the beginning of her menses.  She has clotting.   Needs a refill of Valtrex which she takes daily.   GYNECOLOGIC HISTORY: Patient's last menstrual period was 01/27/2019. Contraception:  Tubal ligation Menopausal hormone therapy:  none Last mammogram:  n/a Last pap smear:    01/26/18 ASCUS:Pos HR HPV                         03/09/18 Colposcopic biopsies were normal        OB History    Gravida  1   Para  1   Term  1   Preterm      AB      Living  1     SAB      TAB      Ectopic      Multiple      Live Births                 Patient Active Problem List   Diagnosis Date Noted  . Endometrial mass 10/26/2018  . Abnormal uterine bleeding 10/26/2018    Past Medical History:  Diagnosis Date  . Abnormal uterine bleeding (AUB)   . Acute hearing loss    Right  . Dizziness   . Elevated AST (SGOT) 10/2016  . Endometrial mass    No biopsy as of 11/01/18.  Hysteroscopy temporarily cancelled for 11/02/18 due to coronavirus.   . Endometriosis   . Left ovarian cyst   . Migraines   . STD (sexually transmitted disease)    HX HSV  . Tinnitus, right ear     Past Surgical History:  Procedure Laterality Date  . ABLATION ON ENDOMETRIOSIS N/A 07/21/2015   Procedure: FULGERATION OF ENDOMETRIOSIS, BIOPSY OF POSTERIOR CUL DE SAC;  Surgeon: Patton SallesBrook E Amundson C Silva, MD;  Location: [facility];  Service:  Gynecology;  Laterality: N/A;  . APPENDECTOMY  2004  . AUGMENTATION MAMMAPLASTY  2011  . CESAREAN SECTION  2003  . CHOLECYSTECTOMY  2004  . LAPAROSCOPIC TUBAL LIGATION Right 07/21/2015   Procedure: LAPAROSCOPIC TUBAL LIGATION;  Surgeon: Patton SallesBrook E Amundson C Silva, MD;  Location: [facility];  Service: Gynecology;  Laterality: Right;  . OVARIAN CYST REMOVAL Left 07/21/2015   Procedure: EXCISION PARA-OVARIAN CYST;  Surgeon: Patton SallesBrook E Amundson C Silva, MD;  Location: [facility];  Service: Gynecology;  Laterality: Left;  . UNILATERAL SALPINGECTOMY Left 07/21/2015   Procedure: DISTAL UNILATERAL SALPINGECTOMY;  Surgeon: Patton SallesBrook E Amundson C Silva, MD;  Location: [facility];  Service: Gynecology;  Laterality: Left;    Current Outpatient Medications  Medication Sig Dispense Refill  . ibuprofen (ADVIL,MOTRIN) 200 MG tablet Take 600-800 mg by mouth every 8 (eight) hours as needed.     . Multiple Vitamins-Minerals (MULTI ADULT GUMMIES) CHEW     . NON FORMULARY CBD oil    . SUMAtriptan (IMITREX) 100  MG tablet Take by mouth as needed.    . valACYclovir (VALTREX) 500 MG tablet Take one tablet by mouth daily. 30 tablet 1   No current facility-administered medications for this visit.      ALLERGIES: Patient has no known allergies.  Family History  Problem Relation Age of Onset  . Colon polyps Mother        multiple pre-cancerous polyps on several colonoscopies  . Diabetes Father   . Hypertension Father   . Hyperlipidemia Father   . Diabetes Paternal Grandmother   . Hyperlipidemia Paternal Grandmother   . Hypertension Paternal Grandmother     Social History   Socioeconomic History  . Marital status: Divorced    Spouse name: Not on file  . Number of children: Not on file  . Years of education: Not on file  . Highest education level: Not on file  Occupational History  . Not on file  Social Needs  . Financial resource strain: Not on file  . Food insecurity    Worry: Not on file    Inability: Not on file   . Transportation needs    Medical: Not on file    Non-medical: Not on file  Tobacco Use  . Smoking status: Light Tobacco Smoker    Types: Cigarettes  . Smokeless tobacco: Never Used  Substance and Sexual Activity  . Alcohol use: Yes    Alcohol/week: 2.0 standard drinks    Types: 2 Standard drinks or equivalent per week    Comment: 1-2 beers per week--only socially  . Drug use: No  . Sexual activity: Yes    Partners: Male    Birth control/protection: Surgical    Comment: Tubal  Lifestyle  . Physical activity    Days per week: Not on file    Minutes per session: Not on file  . Stress: Not on file  Relationships  . Social Musicianconnections    Talks on phone: Not on file    Gets together: Not on file    Attends religious service: Not on file    Active member of club or organization: Not on file    Attends meetings of clubs or organizations: Not on file    Relationship status: Not on file  . Intimate partner violence    Fear of current or ex partner: Not on file    Emotionally abused: Not on file    Physically abused: Not on file    Forced sexual activity: Not on file  Other Topics Concern  . Not on file  Social History Narrative  . Not on file    Review of Systems  Constitutional: Negative.   HENT: Negative.   Eyes: Negative.   Respiratory: Negative.   Cardiovascular: Negative.   Gastrointestinal: Negative.   Endocrine: Negative.   Genitourinary: Negative.   Musculoskeletal: Negative.   Skin: Negative.   Allergic/Immunologic: Negative.   Neurological: Negative.   Hematological: Negative.   Psychiatric/Behavioral: Negative.     PHYSICAL EXAMINATION:    BP 112/60 (BP Location: Right Arm, Patient Position: Sitting, Cuff Size: Normal)   Pulse 72   Temp (!) 97.3 F (36.3 C) (Temporal)   Resp 12   Ht 5\' 3"  (1.6 m)   Wt 126 lb (57.2 kg)   LMP 01/27/2019   BMI 22.32 kg/m     General appearance: alert, cooperative and appears stated age   Pelvic US Uterus no  masses.  EMS 6.82 mm, echogenic focus near C/S scar? Normal ovaries.  No  free fluid.   Sonhysterogram Consent for procedure.  Sterile prep with Hibiclens.  Tenaculum to anterior cervical lip.  Cannula passed.  NS injected and no filling defects noted.  No complications minimal EBL.  ASSESSMENT  Hx abnormal uterine bleeding, resolved.  Endometrial polyp.  No further evidence of this today.  HSV. Hx ASCUS, positive HR HPV pap and normal biopsies.  PLAN  No surgical care recommended.  We discussed NSAIDS, Lysteda and hormonal contraceptives to control heavy bleeding.  Patient opts for NSAIDs.  Refill of Valtrex 500 mg daily for prophylaxis.  Annual exam in July 22 with pap and HR HPV.  An After Visit Summary was printed and given to the patient.  _15_____ minutes face to face time of which over 50% was spent in counseling.

## 2019-02-07 NOTE — Progress Notes (Signed)
Encounter reviewed by Dr. Brook Amundson C. Silva.  

## 2019-02-14 ENCOUNTER — Other Ambulatory Visit: Payer: Self-pay

## 2019-02-14 MED ORDER — VALACYCLOVIR HCL 500 MG PO TABS: ORAL_TABLET | ORAL | 0 refills | Status: DC

## 2019-02-14 NOTE — Telephone Encounter (Signed)
Medication refill request: Valtrex  Last AEX:  01/26/18 Next AEX: 03/06/19 Last MMG (if hormonal medication request): na Refill authorized: #[id number] with 0 rf

## 2019-02-22 ENCOUNTER — Ambulatory Visit: Payer: 59 | Admitting: Obstetrics and Gynecology

## 2019-03-06 ENCOUNTER — Other Ambulatory Visit: Payer: Self-pay

## 2019-03-06 ENCOUNTER — Other Ambulatory Visit (HOSPITAL_COMMUNITY)
Admission: RE | Admit: 2019-03-06 | Discharge: 2019-03-06 | Disposition: A | Discharge status: Home / Self Care | Payer: 59 | Source: Ambulatory Visit | Attending: Obstetrics and Gynecology | Admitting: Obstetrics and Gynecology

## 2019-03-06 ENCOUNTER — Ambulatory Visit (INDEPENDENT_AMBULATORY_CARE_PROVIDER_SITE_OTHER): Payer: 59 | Admitting: Obstetrics and Gynecology

## 2019-03-06 ENCOUNTER — Encounter: Payer: Self-pay | Admitting: Obstetrics and Gynecology

## 2019-03-06 VITALS — BP 108/66 | HR 64 | Temp 97.6°F | Resp 12 | Ht 64.5 in | Wt 124.6 lb

## 2019-03-06 DIAGNOSIS — Z01419 Encounter for gynecological examination (general) (routine) without abnormal findings: Secondary | ICD-10-CM | POA: Diagnosis present

## 2019-03-06 MED ORDER — VALACYCLOVIR HCL 500 MG PO TABS: ORAL_TABLET | ORAL | 3 refills | Status: DC

## 2019-03-06 NOTE — Patient Instructions (Signed)

## 2019-03-06 NOTE — Progress Notes (Signed)
38 y.o. 361P1001 Divorced Caucasian female here for annual exam. Patient states that she currently spotting some. This is the end of her cycle.  Patient had irregular bleeding and an endometrial mass that resolved.  Sonohysterogram confirmed the absence of endometrial masses on 02/07/19.   Son is now 36 yo.   PCP: Gerre PebblesSally Davis, PA-C    Patient's last menstrual period was 02/27/2019.           Sexually active: Yes.    The current method of family planning is tubal ligation.    Exercising: Yes.    weights, cardio Smoker:  yes  Health Maintenance: Pap:  01/26/18 ASCUS:Pos HR HPV 03/09/18 Colposcopic biopsies were normal History of abnormal Pap:  yes TDaP:  10/10/2014 Gardasil:   no HIV and Hep C: 03/09/18 Negative Screening Labs: discuss today   reports that she has been smoking cigarettes. She has never used smokeless tobacco. She reports current alcohol use of about 2.0 standard drinks of alcohol per week. She reports that she does not use drugs.  Past Medical History:  Diagnosis Date  . Abnormal uterine bleeding (AUB)   . Acute hearing loss    Right  . Dizziness   . Elevated AST (SGOT) 10/2016  . Endometrial mass    No biopsy as of 11/01/18.  Hysteroscopy temporarily cancelled for 11/02/18 due to coronavirus.   . Endometriosis   . Left ovarian cyst   . Migraines   . STD (sexually transmitted disease)    HX HSV  . Tinnitus, right ear     Past Surgical History:  Procedure Laterality Date  . ABLATION ON ENDOMETRIOSIS N/A 07/21/2015   Procedure: FULGERATION OF ENDOMETRIOSIS, BIOPSY OF POSTERIOR CUL DE SAC;  Surgeon: Patton SallesBrook E Amundson C Silva, MD;  Location: [facility];  Service: Gynecology;  Laterality: N/A;  . APPENDECTOMY  2004  . AUGMENTATION MAMMAPLASTY  2011  . CESAREAN SECTION  2003  . CHOLECYSTECTOMY  2004  . LAPAROSCOPIC TUBAL LIGATION Right 07/21/2015   Procedure: LAPAROSCOPIC TUBAL LIGATION;  Surgeon: Patton SallesBrook E Amundson C Silva, MD;  Location: [facility];  Service:  Gynecology;  Laterality: Right;  . OVARIAN CYST REMOVAL Left 07/21/2015   Procedure: EXCISION PARA-OVARIAN CYST;  Surgeon: Patton SallesBrook E Amundson C Silva, MD;  Location: [facility];  Service: Gynecology;  Laterality: Left;  . UNILATERAL SALPINGECTOMY Left 07/21/2015   Procedure: DISTAL UNILATERAL SALPINGECTOMY;  Surgeon: Patton SallesBrook E Amundson C Silva, MD;  Location: [facility];  Service: Gynecology;  Laterality: Left;    Current Outpatient Medications  Medication Sig Dispense Refill  . ibuprofen (ADVIL,MOTRIN) 200 MG tablet Take 600-800 mg by mouth every 8 (eight) hours as needed.     . Multiple Vitamins-Minerals (MULTI ADULT GUMMIES) CHEW     . NON FORMULARY CBD oil    . SUMAtriptan (IMITREX) 100 MG tablet Take by mouth as needed.    . valACYclovir (VALTREX) 500 MG tablet Take one tablet by mouth daily. 30 tablet 0   No current facility-administered medications for this visit.    Lysine 1000 mg daily.   Family History  Problem Relation Age of Onset  . Colon polyps Mother        multiple pre-cancerous polyps on several colonoscopies  . Diabetes Father   . Hypertension Father   . Hyperlipidemia Father   . Diabetes Paternal Grandmother   . Hyperlipidemia Paternal Grandmother   . Hypertension Paternal Grandmother     Review of Systems  Constitutional: Negative.   HENT: Negative.  Eyes: Negative.   Respiratory: Negative.   Cardiovascular: Negative.   Gastrointestinal: Negative.   Endocrine: Negative.   Genitourinary: Negative.   Musculoskeletal: Negative.   Skin: Negative.   Allergic/Immunologic: Negative.   Neurological: Negative.   Hematological: Negative.   Psychiatric/Behavioral: Negative.     Exam:   Temp 97.6 F (36.4 C) (Temporal)   Ht 5' 4.5" (1.638 m)   Wt 124 lb 9.6 oz (56.5 kg)   LMP 02/27/2019   BMI 21.06 kg/m     General appearance: alert, cooperative and appears stated age Head: normocephalic, without obvious abnormality, atraumatic Neck: no adenopathy, supple,  symmetrical, trachea midline and thyroid normal to inspection and palpation Lungs: clear to auscultation bilaterally Breasts: bilateral implants, no masses or tenderness, No nipple retraction or dimpling, No nipple discharge or bleeding, No axillary adenopathy Heart: regular rate and rhythm Abdomen: soft, non-tender; no masses, no organomegaly Extremities: extremities normal, atraumatic, no cyanosis or edema Skin: skin color, texture, turgor normal. No rashes or lesions Lymph nodes: cervical, supraclavicular, and axillary nodes normal. Neurologic: grossly normal  Pelvic: External genitalia:  no lesions              No abnormal inguinal nodes palpated.              Urethra:  normal appearing urethra with no masses, tenderness or lesions              Bartholins and Skenes: normal                 Vagina: normal appearing vagina with normal color and discharge, no lesions              Cervix: no lesions              Pap taken: Yes.   Bimanual Exam:  Uterus:  normal size, contour, position, consistency, mobility, non-tender              Adnexa: no mass, fullness, tenderness             Chaperone was present for exam.  Assessment:   Well woman visit with normal exam. Status post BTL. Hx HSV.  Hx endometriosis. Status post bilateral breast augmentation.  Hx ASCUS and positive HR HPV. FH of precancerous colon polyps.  Patient told to start colonoscopy at age 34.   Plan: Mammogram screening discussed. Self breast awareness reviewed. Pap and HR HPV as above. Guidelines for Calcium, Vitamin D, regular exercise program including cardiovascular and weight bearing exercise. Lipid profile and CMP.  Refill of Valtrex 500 mg daily.  Follow up annually and prn.   After visit summary provided.

## 2019-03-07 LAB — COMPREHENSIVE METABOLIC PANEL
ALT: 11 IU/L (ref 0–32)
AST: 16 IU/L (ref 0–40)
Albumin/Globulin Ratio: 1.8 (ref 1.2–2.2)
Albumin: 4.6 g/dL (ref 3.8–4.8)
Alkaline Phosphatase: 35 IU/L — ABNORMAL LOW (ref 39–117)
BUN/Creatinine Ratio: 14 (ref 9–23)
BUN: 12 mg/dL (ref 6–20)
Bilirubin Total: 0.2 mg/dL (ref 0.0–1.2)
CO2: 23 mmol/L (ref 20–29)
Calcium: 9.5 mg/dL (ref 8.7–10.2)
Chloride: 105 mmol/L (ref 96–106)
Creatinine, Ser: 0.88 mg/dL (ref 0.57–1.00)
GFR calc Af Amer: 98 mL/min/{1.73_m2} (ref >59)
GFR calc non Af Amer: 85 mL/min/{1.73_m2} (ref >59)
Globulin, Total: 2.5 g/dL (ref 1.5–4.5)
Glucose: 91 mg/dL (ref 65–99)
Potassium: 3.6 mmol/L (ref 3.5–5.2)
Sodium: 143 mmol/L (ref 134–144)
Total Protein: 7.1 g/dL (ref 6.0–8.5)

## 2019-03-07 LAB — LIPID PANEL
Chol/HDL Ratio: 2.7 ratio (ref 0.0–4.4)
Cholesterol, Total: 129 mg/dL (ref 100–199)
HDL: 48 mg/dL (ref >39)
LDL Calculated: 73 mg/dL (ref 0–99)
Triglycerides: 39 mg/dL (ref 0–149)
VLDL Cholesterol Cal: 8 mg/dL (ref 5–40)

## 2019-03-08 LAB — CYTOLOGY - PAP
Diagnosis: UNDETERMINED — AB
HPV: NOT DETECTED

## 2019-03-12 ENCOUNTER — Telehealth: Payer: Self-pay | Admitting: *Deleted

## 2019-03-12 ENCOUNTER — Telehealth: Payer: Self-pay | Admitting: Obstetrics and Gynecology

## 2019-03-12 DIAGNOSIS — R87610 Atypical squamous cells of undetermined significance on cytologic smear of cervix (ASC-US): Secondary | ICD-10-CM

## 2019-03-12 NOTE — Telephone Encounter (Signed)
Notes recorded by Burnice Logan, RN on 03/12/2019 at 11:55 AM EDT  Spoke with patient, advised as seen below per Dr. Quincy Simmonds. Patient is familiar with colpo procedure. LMP 02/27/19. Tubal ligation for contraceptive. Order placed for precert. Patient asking if she should be concerned with alkaline phosphatase being low for the past 2 years? Anything she should be doing? Advised I will forward to Dr. Quincy Simmonds to review and return call with recommendations. Patient verbalizes understanding and is agreeable. See telephone encounter dated 03/12/19.   Dr. Quincy Simmonds -please advise on alkaline phosphatase

## 2019-03-12 NOTE — Telephone Encounter (Signed)
Call placed to convey benefits for colposcopy. °

## 2019-03-12 NOTE — Telephone Encounter (Signed)
Spoke with patient, advised as seen below per Dr. Quincy Simmonds. Patient thankful for return call. Patient will further discuss at [place] visit.   Routing to provider for final review. Patient is agreeable to disposition. Will close encounter.

## 2019-03-12 NOTE — Telephone Encounter (Signed)
I suspect that the low alk phos is normal for the patient.  She has been in the low normal range for years.  In rare circumstances this can be associated with low thyroid function, low zinc, pernicious anemia or Wilson's disease.  We can test her for low zinc when she comes in for the colposcopy.  She does not have signs of these other illnesses. Her thyroid has been tested and is normal.

## 2019-03-12 NOTE — Telephone Encounter (Signed)
-----  Message from Nunzio Cobbs, MD sent at 03/10/2019  6:19 PM EDT ----- Please contact patient with results of her testing.  Her pap is showing atypical cells, and her high risk HPV test is negative.  Another colposcopy is indicated.  Please send to precert and schedule with me.   Her cholesterol panel and lipid profile looked good.  Her alk phos usually runs slightly low.  Her total cholesterol was 129!

## 2019-03-13 NOTE — Telephone Encounter (Signed)
Patient returned call

## 2019-03-13 NOTE — Telephone Encounter (Signed)
Spoke with patient she understands/agreeable with the benefits. Patient is aware of the cancellation policy. Appointment scheduled 03/14/19.

## 2019-03-14 ENCOUNTER — Encounter: Payer: Self-pay | Admitting: Obstetrics and Gynecology

## 2019-03-14 ENCOUNTER — Ambulatory Visit (INDEPENDENT_AMBULATORY_CARE_PROVIDER_SITE_OTHER): Payer: 59 | Admitting: Obstetrics and Gynecology

## 2019-03-14 ENCOUNTER — Other Ambulatory Visit: Payer: Self-pay

## 2019-03-14 VITALS — BP 114/60 | HR 68 | Temp 97.9°F | Resp 12 | Ht 64.5 in | Wt 127.0 lb

## 2019-03-14 DIAGNOSIS — R87610 Atypical squamous cells of undetermined significance on cytologic smear of cervix (ASC-US): Secondary | ICD-10-CM | POA: Diagnosis not present

## 2019-03-14 DIAGNOSIS — Z01812 Encounter for preprocedural laboratory examination: Secondary | ICD-10-CM

## 2019-03-14 DIAGNOSIS — R748 Abnormal levels of other serum enzymes: Secondary | ICD-10-CM | POA: Diagnosis not present

## 2019-03-14 LAB — POCT URINE PREGNANCY: Preg Test, Ur: NEGATIVE

## 2019-03-14 NOTE — Progress Notes (Signed)
  Subjective:     Patient ID: Ann Whitehead, female   DOB: Oct 31, 1980, 38 y.o.   MRN: [id number]  HPI Pap History: 03/06/19 ASCUS:Neg HR HPV 01/26/18 ASCUS:Pos HR HPV 03/09/18 Colposcopic biopsies were normal.  Did not do Gardasil vaccine.   Review of Systems LMP: 02/27/19 Contraception: Tubal Ligation UPT: negative Patient has taken '800mg'$  of Ibuprofen around 1:15pm    Objective:   Physical Exam  Colposcopy Consent for procedure.  3% acetic acid used.  White light and green light filter used.  No lesions noted on the cervix or vagina.  Lugol's placed.  No decreased uptake noted.  ECC performed and tissue to pathology. Minimal EBL.  No complications.     Assessment:     ASCUS pap and negative HR HPV.  Prior ASCUS pap and positive HR HPV with normal colposcopic biopsies.  Low serum alk phosphatase.    Plan:      We reviewed her pap and HPV status this year and last.  We reviewed ASCCP guidelines.  Anticipate pap and HR HPV in one year.  I discussed Gardasil vaccine, and she will consider.  We discussed low alk phos levels and etiologies - Wilson's disease, hypothyroidism. Pernicious anemia, low zinc.  Will test zinc and vit B12 levels.  FU prn.   ____15___ minutes face to face time of which over 50% was spent in counseling.

## 2019-03-14 NOTE — Patient Instructions (Addendum)
Colposcopy, Care After This sheet gives you information about how to care for yourself after your procedure. Your doctor may also give you more specific instructions. If you have problems or questions, contact your doctor. What can I expect after the procedure? If you did not have a tissue sample removed (did not have a biopsy), you may only have some spotting for a few days. You can go back to your normal activities. If you had a tissue sample removed, it is common to have:  Soreness and pain. This may last for a few days.  Light-headedness.  Mild bleeding from your vagina or dark-colored, grainy discharge from your vagina. This may last for a few days. You may need to wear a sanitary pad.  Spotting for at least 48 hours after the procedure. Follow these instructions at home:   Take over-the-counter and prescription medicines only as told by your doctor. Ask your doctor what medicines you can start taking again. This is very important if you take blood-thinning medicine.  Do not drive or use heavy machinery while taking prescription pain medicine.  For 3 days, or as long as your doctor tells you, avoid: ? Douching. ? Using tampons. ? Having sex.  If you use birth control (contraception), keep using it.  Limit activity for the first day after the procedure. Ask your doctor what activities are safe for you.  It is up to you to get the results of your procedure. Ask your doctor when your results will be ready.  Keep all follow-up visits as told by your doctor. This is important. Contact a doctor if:  You get a skin rash. Get help right away if:  You are bleeding a lot from your vagina. It is a lot of bleeding if you are using more than one pad an hour for 2 hours in a row.  You have clumps of blood (blood clots) coming from your vagina.  You have a fever.  You have chills  You have pain in your lower belly (pelvic area).  You have signs of infection, such as vaginal  discharge that is: ? Different than usual. ? Yellow. ? Bad-smelling.  You have very pain or cramps in your lower belly that do not get better with medicine.  You feel light-headed.  You feel dizzy.  You pass out (faint). Summary  If you did not have a tissue sample removed (did not have a biopsy), you may only have some spotting for a few days. You can go back to your normal activities.  If you had a tissue sample removed, it is common to have mild pain and spotting for 48 hours.  For 3 days, or as long as your doctor tells you, avoid douching, using tampons and having sex.  Get help right away if you have bleeding, very bad pain, or signs of infection. This information is not intended to replace advice given to you by your health care provider. Make sure you discuss any questions you have with your health care provider. Document Released: 01/18/2008 Document Revised: 07/14/2017 Document Reviewed: 04/20/2016 Elsevier Patient Education  [place]. Human Papillomavirus Quadrivalent Vaccine suspension for injection What is this medicine? HUMAN PAPILLOMAVIRUS VACCINE (HYOO muhn pap uh LOH muh vahy ruhs vak SEEN) is a vaccine. It is used to prevent infections of four types of the human papillomavirus. In women, the vaccine may lower your risk of getting cervical, vaginal, vulvar, or anal cancer and genital warts. In men, the vaccine may lower  your risk of getting genital warts and anal cancer. You cannot get these diseases from the vaccine. This vaccine does not treat these diseases. This medicine may be used for other purposes; ask your health care provider or pharmacist if you have questions. COMMON BRAND NAME(S): Gardasil What should I tell my health care provider before I take this medicine? They need to know if you have any of these conditions:  fever or infection  hemophilia  HIV infection or AIDS  immune system problems  low platelet count  an unusual reaction to  Human Papillomavirus Vaccine, yeast, other medicines, foods, dyes, or preservatives  pregnant or trying to get pregnant  breast-feeding How should I use this medicine? This vaccine is for injection in a muscle on your upper arm or thigh. It is given by a health care professional. Dennis Bast will be observed for 15 minutes after each dose. Sometimes, fainting happens after the vaccine is given. You may be asked to sit or lie down during the 15 minutes. Three doses are given. The second dose is given 2 months after the first dose. The last dose is given 4 months after the second dose. A copy of a Vaccine Information Statement will be given before each vaccination. Read this sheet carefully each time. The sheet may change frequently. Talk to your pediatrician regarding the use of this medicine in children. While this drug may be prescribed for children as young as 30 years of age for selected conditions, precautions do apply. Overdosage: If you think you have taken too much of this medicine contact a poison control center or emergency room at once. NOTE: This medicine is only for you. Do not share this medicine with others. What if I miss a dose? All 3 doses of the vaccine should be given within 6 months. Remember to keep appointments for follow-up doses. Your health care provider will tell you when to return for the next vaccine. Ask your health care professional for advice if you are unable to keep an appointment or miss a scheduled dose. What may interact with this medicine?  other vaccines This list may not describe all possible interactions. Give your health care provider a list of all the medicines, herbs, non-prescription drugs, or dietary supplements you use. Also tell them if you smoke, drink alcohol, or use illegal drugs. Some items may interact with your medicine. What should I watch for while using this medicine? This vaccine may not fully protect everyone. Continue to have regular pelvic exams  and cervical or anal cancer screenings as directed by your doctor. The Human Papillomavirus is a sexually transmitted disease. It can be passed by any kind of sexual activity that involves genital contact. The vaccine works best when given before you have any contact with the virus. Many people who have the virus do not have any signs or symptoms. Tell your doctor or health care professional if you have any reaction or unusual symptom after getting the vaccine. What side effects may I notice from receiving this medicine? Side effects that you should report to your doctor or health care professional as soon as possible:  allergic reactions like skin rash, itching or hives, swelling of the face, lips, or tongue  breathing problems  feeling faint or lightheaded, falls Side effects that usually do not require medical attention (report to your doctor or health care professional if they continue or are bothersome):  cough  dizziness  fever  headache  nausea  redness, warmth, swelling, pain, or  itching at site where injected This list may not describe all possible side effects. Call your doctor for medical advice about side effects. You may report side effects to FDA at 1-800-FDA-1088. Where should I keep my medicine? This drug is given in a hospital or clinic and will not be stored at home. NOTE: This sheet is a summary. It may not cover all possible information. If you have questions about this medicine, talk to your doctor, pharmacist, or health care provider.  2020 Elsevier/Gold Standard (2013-09-23 13:14:33)

## 2019-03-16 LAB — ZINC: Zinc: 59 ug/dL (ref 56–134)

## 2019-03-16 LAB — VITAMIN B12: Vitamin B-12: 298 pg/mL (ref 232–1245)

## 2019-08-19 ENCOUNTER — Encounter: Payer: Self-pay | Admitting: Obstetrics and Gynecology

## 2019-08-19 ENCOUNTER — Telehealth: Payer: Self-pay | Admitting: Obstetrics and Gynecology

## 2019-08-19 NOTE — Telephone Encounter (Signed)
Appointment Request From: Ann Whitehead    With Provider: Melony Overly, MD [facility]]    Preferred Date Range: 08/23/2019 - 08/23/2019    Preferred Times: Friday Afternoon    Reason for visit: Request an Appointment    Comments:  Abnormal bleeding

## 2019-08-19 NOTE — Telephone Encounter (Signed)
Spoke to pt. Pt states having abnormal bleeding x 2 weeks and 2 cycles within last 30 days. Pt states wearing 1 tampon all day with bright red bleeding to brown and clots dime to nickel size. Had cancelled EMB due to COVID. Had [facility] in June and no polyp seen then.  Pt requests appt on 08/20/2019 per work schedule.  Pt scheduled for OV with Dr Edward Jolly on 08/20/2019 at 1030. Pt agreeable.   Will route to Dr Edward Jolly for review and will close encounter.

## 2019-08-19 NOTE — Telephone Encounter (Signed)
Spoke with patient. Patient is scheduled for OV on 08/20/19 for abnormal bleeding, asking if PUS will be done on the same day? Advised patient this will be an OV, no [place] scheduled at this time. [facility] on 02/07/19. Advised Dr. Edward Jolly will see her for further evaluation, will discuss additional evaluation at OV, if needed.   Routing to provider for final review. Patient is agreeable to disposition. Will close encounter.

## 2019-08-19 NOTE — Telephone Encounter (Signed)
Patient sent the following correspondence through MyChart.  With my appt tomorrow, will I have an ultrasound then or have to return?

## 2019-08-19 NOTE — Progress Notes (Signed)
GYNECOLOGY  VISIT   HPI: 39 y.o.   Divorced  Caucasian  female   G1P1001 with Patient's last menstrual period was 08/09/2019.   here for irregular bleeding.   LNMP 07-25-19 (wasn't due until 07-30-19).  Lasted 7 - 8 days.  LMP 08-09-19 and is still bleeding. Some days it is heavy and other days it is not.  Mild cramping and low back pain.  No excessive fatigue.  No SOB.  Has HA all the time, but this is not new for her.   Patient has hx of irregular bleeding 10 months ago and an endometrial mas on [place]. She had an EMB showing a polyp. She was scheduled for a hysteroscopy in March, and it was cancelled due to Covid.  Sonohysterogram follow up confirmed the absence of endometrial masses on 02/07/19.   Her menses have been monthly following this until now.  No new partner.   UPT:Neg  GYNECOLOGIC HISTORY: Patient's last menstrual period was 08/09/2019. Contraception:  Tubal Menopausal hormone therapy:  none Last mammogram: n/a Last pap smear:03-06-19 ASCUS:Neg HR HPV,01/26/18 ASCUS:Pos HR HPV;03/09/18 Colposcopic biopsies were normal        OB History    Gravida  1   Para  1   Term  1   Preterm      AB      Living  1     SAB      TAB      Ectopic      Multiple      Live Births                 Patient Active Problem List   Diagnosis Date Noted  . Endometrial mass 10/26/2018  . Abnormal uterine bleeding 10/26/2018    Past Medical History:  Diagnosis Date  . Abnormal uterine bleeding (AUB)   . Acute hearing loss    Right  . Dizziness   . Elevated AST (SGOT) 10/2016  . Endometrial mass    No biopsy as of 11/01/18.  Hysteroscopy temporarily cancelled for 11/02/18 due to coronavirus.   . Endometriosis   . Left ovarian cyst   . Migraines    without aura  . STD (sexually transmitted disease)    HX HSV  . Tinnitus, right ear     Past Surgical History:  Procedure Laterality Date  . ABLATION ON ENDOMETRIOSIS N/A 07/21/2015   Procedure: FULGERATION OF  ENDOMETRIOSIS, BIOPSY OF POSTERIOR CUL DE SAC;  Surgeon: Patton Salles, MD;  Location: [facility];  Service: Gynecology;  Laterality: N/A;  . APPENDECTOMY  2004  . AUGMENTATION MAMMAPLASTY  2011  . CESAREAN SECTION  2003  . CHOLECYSTECTOMY  2004  . LAPAROSCOPIC TUBAL LIGATION Right 07/21/2015   Procedure: LAPAROSCOPIC TUBAL LIGATION;  Surgeon: Patton Salles, MD;  Location: [facility];  Service: Gynecology;  Laterality: Right;  . OVARIAN CYST REMOVAL Left 07/21/2015   Procedure: EXCISION PARA-OVARIAN CYST;  Surgeon: Patton Salles, MD;  Location: [facility];  Service: Gynecology;  Laterality: Left;  . UNILATERAL SALPINGECTOMY Left 07/21/2015   Procedure: DISTAL UNILATERAL SALPINGECTOMY;  Surgeon: Patton Salles, MD;  Location: [facility];  Service: Gynecology;  Laterality: Left;    Current Outpatient Medications  Medication Sig Dispense Refill  . Ascorbic Acid (VITAMIN C) 1000 MG tablet Take 1,000 mg by mouth daily.    . Cholecalciferol (VITAMIN D-3 PO) Take 1 tablet by mouth daily.    Marland Kitchen ELDERBERRY PO  Take 1 tablet by mouth daily.    . fluticasone (FLONASE) 50 MCG/ACT nasal spray Place 1 spray into both nostrils as needed.    Marland Kitchen ibuprofen (ADVIL,MOTRIN) 200 MG tablet Take 600-800 mg by mouth every 8 (eight) hours as needed.     . Multiple Vitamins-Minerals (MULTI ADULT GUMMIES) CHEW     . SUMAtriptan (IMITREX) 100 MG tablet Take by mouth as needed.    . valACYclovir (VALTREX) 500 MG tablet Take one tablet by mouth daily. 90 tablet 3  . zinc gluconate 50 MG tablet Take 50 mg by mouth daily.     No current facility-administered medications for this visit.     ALLERGIES: Patient has no known allergies.  Family History  Problem Relation Age of Onset  . Colon polyps Mother        multiple pre-cancerous polyps on several colonoscopies  . Diabetes Father   . Hypertension Father   . Hyperlipidemia Father   . Diabetes Paternal Grandmother   . Hyperlipidemia Paternal  Grandmother   . Hypertension Paternal Grandmother     Social History   Socioeconomic History  . Marital status: Divorced    Spouse name: Not on file  . Number of children: Not on file  . Years of education: Not on file  . Highest education level: Not on file  Occupational History  . Not on file  Tobacco Use  . Smoking status: Light Tobacco Smoker    Types: Cigarettes  . Smokeless tobacco: Never Used  Substance and Sexual Activity  . Alcohol use: Yes    Alcohol/week: 2.0 standard drinks    Types: 2 Standard drinks or equivalent per week    Comment: 1-2 beers per week--only socially  . Drug use: No  . Sexual activity: Yes    Partners: Male    Birth control/protection: Surgical    Comment: Tubal  Other Topics Concern  . Not on file  Social History Narrative  . Not on file   Social Determinants of Health   Financial Resource Strain:   . Difficulty of Paying Living Expenses: Not on file  Food Insecurity:   . Worried About Programme researcher, broadcasting/film/video in the Last Year: Not on file  . Ran Out of Food in the Last Year: Not on file  Transportation Needs:   . Lack of Transportation (Medical): Not on file  . Lack of Transportation (Non-Medical): Not on file  Physical Activity:   . Days of Exercise per Week: Not on file  . Minutes of Exercise per Session: Not on file  Stress:   . Feeling of Stress : Not on file  Social Connections:   . Frequency of Communication with Friends and Family: Not on file  . Frequency of Social Gatherings with Friends and Family: Not on file  . Attends Religious Services: Not on file  . Active Member of Clubs or Organizations: Not on file  . Attends Banker Meetings: Not on file  . Marital Status: Not on file  Intimate Partner Violence:   . Fear of Current or Ex-Partner: Not on file  . Emotionally Abused: Not on file  . Physically Abused: Not on file  . Sexually Abused: Not on file    Review of Systems  All other systems reviewed and  are negative.   PHYSICAL EXAMINATION:    BP 118/60   Pulse 60   Temp (!) 97 F (36.1 C) (Temporal)   Ht 5' 4.5" (1.638 m)   Wt 128  lb (58.1 kg)   LMP 08/09/2019   BMI 21.63 kg/m     General appearance: alert, cooperative and appears stated age   Pelvic: External genitalia:  no lesions              Urethra:  normal appearing urethra with no masses, tenderness or lesions              Bartholins and Skenes: normal                 Vagina: normal appearing vagina with normal color and discharge, no lesions              Cervix: no lesions.  Menstrual flow.                 Bimanual Exam:  Uterus:  normal size, contour, position, consistency, mobility, non-tender              Adnexa: no mass, fullness, tenderness       Chaperone was present for exam.  ASSESSMENT  Irregular bleeding.  Migraine HA without aura.  Tobacco use.   PLAN  We discussed irregular bleeding and possible etiologies. Provera 10 mg x 10 days.  We discussed potential Micronor or Mirena IUD.  If her bleeding does not resolve, will consider hysteroscopy with dilation and curettage.   An After Visit Summary was printed and given to the patient.  __15____ minutes face to face time of which over 50% was spent in counseling.

## 2019-08-20 ENCOUNTER — Ambulatory Visit: Payer: BC Managed Care – PPO | Admitting: Obstetrics and Gynecology

## 2019-08-20 ENCOUNTER — Encounter: Payer: Self-pay | Admitting: Obstetrics and Gynecology

## 2019-08-20 ENCOUNTER — Other Ambulatory Visit: Payer: Self-pay

## 2019-08-20 VITALS — BP 118/60 | HR 60 | Temp 97.0°F | Ht 64.5 in | Wt 128.0 lb

## 2019-08-20 DIAGNOSIS — N926 Irregular menstruation, unspecified: Secondary | ICD-10-CM

## 2019-08-20 LAB — POCT URINE PREGNANCY: Preg Test, Ur: NEGATIVE

## 2019-08-20 MED ORDER — MEDROXYPROGESTERONE ACETATE 10 MG PO TABS: 10.0000 mg | ORAL_TABLET | Freq: Every day | ORAL | 0 refills | Status: DC

## 2019-08-26 ENCOUNTER — Encounter: Payer: Self-pay | Admitting: Obstetrics and Gynecology

## 2019-08-26 ENCOUNTER — Telehealth: Payer: Self-pay | Admitting: Obstetrics and Gynecology

## 2019-08-26 NOTE — Telephone Encounter (Signed)
Patient sent the following correspondence through MyChart.  I have three days left of my progesterone pills. I am still experiencing bleeding. Should I wait until day 10 of the progesterone, or what is the next step. Thank you

## 2019-08-27 NOTE — Telephone Encounter (Signed)
Left message to call Kennetta Pavlovic, RN at [facility] [phone].   

## 2019-08-28 NOTE — Telephone Encounter (Signed)
Patient returning call.

## 2019-08-28 NOTE — Telephone Encounter (Signed)
Spoke with patient. Was seen in office on 08/20/19 for irregular menses, started on Provera 10mg  PO daily x10 days, will take last dose tomorrow. Patient reports no change in her bleeding. Changes from dark brown to bright red, changed from panty liner to super plus tampon just a few minutes ago. Reports daily headache and fatigue. Is unsure if fatigue is related to job or menses. Does not want a IUD or to be on medication, "I want to know the cause of the bleeding, not just fix it". Surgery was also discussed at last OV.   Advised patient I will provide update to Dr. and return call to advise. Patient agreeable.   Dr. Edward Jolly -please advise.

## 2019-08-28 NOTE — Telephone Encounter (Signed)
Patient is returning call to Jill, RN.  °

## 2019-08-28 NOTE — Telephone Encounter (Signed)
Left message to call Sheela Mcculley, RN at [facility] [phone].   

## 2019-08-29 ENCOUNTER — Telehealth: Payer: Self-pay | Admitting: Obstetrics and Gynecology

## 2019-08-29 ENCOUNTER — Encounter: Payer: Self-pay | Admitting: Obstetrics and Gynecology

## 2019-08-29 NOTE — Telephone Encounter (Signed)
See open telephone encounter dated 08/26/19.   Encounter closed.  

## 2019-08-29 NOTE — Telephone Encounter (Signed)
Ingram, Syana  P [facility]  Phone Number: [phone]  I have not received a phone call back after my conversation yesterday around 1. Today moderate cramping and low back pain and heavier bleeding with clots. Bright red in color and headache, fatigue and nausea.   

## 2019-08-29 NOTE — Telephone Encounter (Signed)
Ann Whitehead, Ann  [facility]  Phone Number: [phone]  I have not received a phone call back after my conversation yesterday around 1. Today moderate cramping and low back pain and heavier bleeding with clots. Bright red in color and headache, fatigue and nausea.

## 2019-08-29 NOTE — Telephone Encounter (Signed)
Spoke with patient. Patient reports headache has not resolved, increased cramping, fatigue and intermittent nausea. Patient states she is unsure if the nausea is r/t the headache. Pain currently 4/10 after taking 800 mg tylenol and 1 extra strength tylenol. Has changes 2 super tampons today this far. Recommended OV for further evaluation, patient states she travels for work, the earliest she could be seen is Monday. OV scheduled for 1/18 at 11am with Dr. Edward Jolly. Advised I will provide update to Dr. Edward Jolly and return call with any additional recommendations. Patient agreeable.   Routing to Dr. Edward Jolly

## 2019-08-29 NOTE — Telephone Encounter (Signed)
Please have her take there Imitrex if she has not already done this for her headache.   We can reassess her bleeding on Monday and discuss possible hysteroscopy with dilation and curettage.

## 2019-08-29 NOTE — Telephone Encounter (Signed)
Spoke with patient. Advised per Dr. Edward Jolly. Patient states she will take her Imitrex when she is home, unable to take while working. Patient verbalizes understanding and is agreeable.   Routing to provider for final review. Patient is agreeable to disposition. Will close encounter.

## 2019-09-02 ENCOUNTER — Ambulatory Visit: Payer: BC Managed Care – PPO | Admitting: Obstetrics and Gynecology

## 2019-09-02 ENCOUNTER — Other Ambulatory Visit (HOSPITAL_COMMUNITY)
Admission: RE | Admit: 2019-09-02 | Discharge: 2019-09-02 | Disposition: A | Discharge status: Home / Self Care | Payer: BC Managed Care – PPO | Source: Ambulatory Visit | Attending: Obstetrics and Gynecology | Admitting: Obstetrics and Gynecology

## 2019-09-02 ENCOUNTER — Encounter: Payer: Self-pay | Admitting: Obstetrics and Gynecology

## 2019-09-02 ENCOUNTER — Other Ambulatory Visit: Payer: Self-pay

## 2019-09-02 VITALS — BP 102/70 | HR 60 | Temp 97.6°F | Ht 64.5 in | Wt 128.6 lb

## 2019-09-02 DIAGNOSIS — Z113 Encounter for screening for infections with a predominantly sexual mode of transmission: Secondary | ICD-10-CM | POA: Insufficient documentation

## 2019-09-02 DIAGNOSIS — N921 Excessive and frequent menstruation with irregular cycle: Secondary | ICD-10-CM | POA: Diagnosis not present

## 2019-09-02 DIAGNOSIS — N9489 Other specified conditions associated with female genital organs and menstrual cycle: Secondary | ICD-10-CM

## 2019-09-02 NOTE — Progress Notes (Signed)
GYNECOLOGY  VISIT   HPI: 39 y.o.   Divorced  Caucasian  female   G1P1001 with Patient's last menstrual period was 08/09/2019.   here for irregular bleeding.    Patient is concerned about the cause of the bleeding and does not want to continue treating with medical therapy.  LMP 08-09-19 and has been bleeding since and heavy at times. Took last Provera on 08-29-19. She never stopped bleeding with the Provera course.  She bleeding continues now.  She can change a tampon every 1 - 2 hours at times.  She has low back pain and cramping.  She has ovulation pain and then has pain with the first day of her cycle.   She feels worn down.   LMP was 12/10 - 12/17, again 12/25 - until now.   She had an EMB showing a polyp, but her SIS showed no filling defects.   GYNECOLOGIC HISTORY: Patient's last menstrual period was 08/09/2019. Contraception: Tubal Menopausal hormone therapy:  n/a Last mammogram:  n/a Last pap smear: 03-06-19 ASCUS:Neg HR HPV,01/26/18 ASCUS:Pos HR HPV;03/09/18 Colposcopic biopsies were normal        OB History    Gravida  1   Para  1   Term  1   Preterm      AB      Living  1     SAB      TAB      Ectopic      Multiple      Live Births                 Patient Active Problem List   Diagnosis Date Noted  . Endometrial mass 10/26/2018  . Abnormal uterine bleeding 10/26/2018    Past Medical History:  Diagnosis Date  . Abnormal uterine bleeding (AUB)   . Acute hearing loss    Right  . Dizziness   . Elevated AST (SGOT) 10/2016  . Endometrial mass    No biopsy as of 11/01/18.  Hysteroscopy temporarily cancelled for 11/02/18 due to coronavirus.   . Endometriosis   . Left ovarian cyst   . Migraines    without aura  . STD (sexually transmitted disease)    HX HSV  . Tinnitus, right ear     Past Surgical History:  Procedure Laterality Date  . ABLATION ON ENDOMETRIOSIS N/A 07/21/2015   Procedure: FULGERATION OF ENDOMETRIOSIS, BIOPSY OF  POSTERIOR CUL DE SAC;  Surgeon: Patton Salles, MD;  Location: [facility];  Service: Gynecology;  Laterality: N/A;  . APPENDECTOMY  2004  . AUGMENTATION MAMMAPLASTY  2011  . CESAREAN SECTION  2003  . CHOLECYSTECTOMY  2004  . LAPAROSCOPIC TUBAL LIGATION Right 07/21/2015   Procedure: LAPAROSCOPIC TUBAL LIGATION;  Surgeon: Patton Salles, MD;  Location: [facility];  Service: Gynecology;  Laterality: Right;  . OVARIAN CYST REMOVAL Left 07/21/2015   Procedure: EXCISION PARA-OVARIAN CYST;  Surgeon: Patton Salles, MD;  Location: [facility];  Service: Gynecology;  Laterality: Left;  . UNILATERAL SALPINGECTOMY Left 07/21/2015   Procedure: DISTAL UNILATERAL SALPINGECTOMY;  Surgeon: Patton Salles, MD;  Location: [facility];  Service: Gynecology;  Laterality: Left;    Current Outpatient Medications  Medication Sig Dispense Refill  . Ascorbic Acid (VITAMIN C) 1000 MG tablet Take 1,000 mg by mouth daily.    . Cholecalciferol (VITAMIN D-3 PO) Take 1 tablet by mouth daily.    Marland Kitchen ELDERBERRY PO Take 1 tablet  by mouth daily.    . fluticasone (FLONASE) 50 MCG/ACT nasal spray Place 1 spray into both nostrils as needed.    Marland Kitchen ibuprofen (ADVIL,MOTRIN) 200 MG tablet Take 600-800 mg by mouth every 8 (eight) hours as needed.     . medroxyPROGESTERone (PROVERA) 10 MG tablet Take 1 tablet (10 mg total) by mouth daily. 10 tablet 0  . Multiple Vitamins-Minerals (MULTI ADULT GUMMIES) CHEW     . SUMAtriptan (IMITREX) 100 MG tablet Take by mouth as needed.    . valACYclovir (VALTREX) 500 MG tablet Take one tablet by mouth daily. 90 tablet 3  . zinc gluconate 50 MG tablet Take 50 mg by mouth daily.     No current facility-administered medications for this visit.     ALLERGIES: Patient has no known allergies.  Family History  Problem Relation Age of Onset  . Colon polyps Mother        multiple pre-cancerous polyps on several colonoscopies  . Diabetes Father   . Hypertension Father   .  Hyperlipidemia Father   . Diabetes Paternal Grandmother   . Hyperlipidemia Paternal Grandmother   . Hypertension Paternal Grandmother     Social History   Socioeconomic History  . Marital status: Divorced    Spouse name: Not on file  . Number of children: Not on file  . Years of education: Not on file  . Highest education level: Not on file  Occupational History  . Not on file  Tobacco Use  . Smoking status: Light Tobacco Smoker    Types: Cigarettes  . Smokeless tobacco: Never Used  Substance and Sexual Activity  . Alcohol use: Yes    Alcohol/week: 2.0 standard drinks    Types: 2 Standard drinks or equivalent per week    Comment: 1-2 beers per week--only socially  . Drug use: No  . Sexual activity: Yes    Partners: Male    Birth control/protection: Surgical    Comment: Tubal  Other Topics Concern  . Not on file  Social History Narrative  . Not on file   Social Determinants of Health   Financial Resource Strain:   . Difficulty of Paying Living Expenses: Not on file  Food Insecurity:   . Worried About Programme researcher, broadcasting/film/video in the Last Year: Not on file  . Ran Out of Food in the Last Year: Not on file  Transportation Needs:   . Lack of Transportation (Medical): Not on file  . Lack of Transportation (Non-Medical): Not on file  Physical Activity:   . Days of Exercise per Week: Not on file  . Minutes of Exercise per Session: Not on file  Stress:   . Feeling of Stress : Not on file  Social Connections:   . Frequency of Communication with Friends and Family: Not on file  . Frequency of Social Gatherings with Friends and Family: Not on file  . Attends Religious Services: Not on file  . Active Member of Clubs or Organizations: Not on file  . Attends Banker Meetings: Not on file  . Marital Status: Not on file  Intimate Partner Violence:   . Fear of Current or Ex-Partner: Not on file  . Emotionally Abused: Not on file  . Physically Abused: Not on file  .  Sexually Abused: Not on file    Review of Systems  All other systems reviewed and are negative.   PHYSICAL EXAMINATION:    BP 102/70   Pulse 60   Temp 97.6  F (36.4 C) (Temporal)   Ht 5' 4.5" (1.638 m)   Wt 128 lb 9.6 oz (58.3 kg)   LMP 08/09/2019   BMI 21.73 kg/m     General appearance: alert, cooperative and appears stated age Head: Normocephalic, without obvious abnormality, atraumatic Lungs: clear to auscultation bilaterally Heart: regular rate and rhythm Abdomen: soft, non-tender, no masses,  no organomegaly Extremities: extremities normal, atraumatic, no cyanosis or edema Skin: Skin color, texture, turgor normal. No rashes or lesions No abnormal inguinal nodes palpated Neurologic: Grossly normal  Pelvic: External genitalia:  no lesions              Urethra:  normal appearing urethra with no masses, tenderness or lesions              Bartholins and Skenes: normal                 Vagina: normal appearing vagina with normal color and discharge, no lesions              Cervix: no lesions.  Blood noted.                 Bimanual Exam:  Uterus:  normal size, contour, position, consistency, mobility, non-tender              Adnexa: no mass, fullness, tenderness.  Left adnexal mass.             Chaperone was present for exam.  ASSESSMENT  Left adnexal mass.  Menorrhagia with irregular menses.  Recurrent abnormal uterine bleeding.  Hx endometriosis.  STD screening.   PLAN  Trich/GC/chlamydia testing.  CBC, iron, ferritin.  Consent for hysteroscopy with dilation and curettage. Risks include but are not limited to bleeding, infection, damage to surrounding organs including uterine perforation, pulmonary edema, reaction to anesthesia, DVT, PE, and death.   Surgical expectations and recovery discussed.    Return for pelvic US prior to proceeding with surgery.  An After Visit Summary was printed and given to the patient.  _20_____ minutes face to face time of which  over 50% was spent in counseling.

## 2019-09-03 ENCOUNTER — Encounter: Payer: Self-pay | Admitting: Obstetrics and Gynecology

## 2019-09-03 ENCOUNTER — Telehealth: Payer: Self-pay | Admitting: Obstetrics and Gynecology

## 2019-09-03 LAB — CBC WITH DIFFERENTIAL/PLATELET
Basophils Absolute: 0.1 10*3/uL (ref 0.0–0.2)
Basos: 1 %
EOS (ABSOLUTE): 0.2 10*3/uL (ref 0.0–0.4)
Eos: 2 %
Hematocrit: 37.4 % (ref 34.0–46.6)
Hemoglobin: 12.3 g/dL (ref 11.1–15.9)
Immature Grans (Abs): 0 10*3/uL (ref 0.0–0.1)
Immature Granulocytes: 0 %
Lymphocytes Absolute: 2.7 10*3/uL (ref 0.7–3.1)
Lymphs: 33 %
MCH: 32 pg (ref 26.6–33.0)
MCHC: 32.9 g/dL (ref 31.5–35.7)
MCV: 97 fL (ref 79–97)
Monocytes Absolute: 0.7 10*3/uL (ref 0.1–0.9)
Monocytes: 9 %
Neutrophils Absolute: 4.6 10*3/uL (ref 1.4–7.0)
Neutrophils: 55 %
Platelets: 325 10*3/uL (ref 150–450)
RBC: 3.84 x10E6/uL (ref 3.77–5.28)
RDW: 11.7 % (ref 11.7–15.4)
WBC: 8.2 10*3/uL (ref 3.4–10.8)

## 2019-09-03 LAB — CERVICOVAGINAL ANCILLARY ONLY
Chlamydia: NEGATIVE
Comment: NEGATIVE
Comment: NEGATIVE
Comment: NORMAL
Neisseria Gonorrhea: NEGATIVE
Trichomonas: NEGATIVE

## 2019-09-03 LAB — IRON: Iron: 98 ug/dL (ref 27–159)

## 2019-09-03 LAB — FERRITIN: Ferritin: 10 ng/mL — ABNORMAL LOW (ref 15–150)

## 2019-09-03 NOTE — Telephone Encounter (Signed)
Per review of Epic, patient scheduled for PUS on 09/05/19 at 10:30am, consult at 11am with Dr. Edward Jolly.   Routing to [employer].  Encounter closed.

## 2019-09-03 NOTE — Telephone Encounter (Signed)
Patient calling to schedule ultrasound appointment.  °

## 2019-09-05 ENCOUNTER — Ambulatory Visit: Payer: BC Managed Care – PPO | Admitting: Obstetrics and Gynecology

## 2019-09-05 ENCOUNTER — Encounter: Payer: Self-pay | Admitting: Obstetrics and Gynecology

## 2019-09-05 ENCOUNTER — Other Ambulatory Visit: Payer: BC Managed Care – PPO

## 2019-09-05 ENCOUNTER — Other Ambulatory Visit: Payer: Self-pay

## 2019-09-05 ENCOUNTER — Ambulatory Visit (INDEPENDENT_AMBULATORY_CARE_PROVIDER_SITE_OTHER): Payer: BC Managed Care – PPO

## 2019-09-05 VITALS — BP 100/62 | HR 66 | Temp 97.2°F | Ht 64.5 in | Wt 128.0 lb

## 2019-09-05 DIAGNOSIS — N921 Excessive and frequent menstruation with irregular cycle: Secondary | ICD-10-CM | POA: Diagnosis not present

## 2019-09-05 DIAGNOSIS — N9489 Other specified conditions associated with female genital organs and menstrual cycle: Secondary | ICD-10-CM | POA: Diagnosis not present

## 2019-09-05 DIAGNOSIS — N939 Abnormal uterine and vaginal bleeding, unspecified: Secondary | ICD-10-CM

## 2019-09-05 NOTE — Progress Notes (Signed)
Encounter reviewed by Dr. Azarya Oconnell Amundson C. Silva.  

## 2019-09-05 NOTE — Progress Notes (Signed)
GYNECOLOGY  VISIT   HPI: 39 y.o.   Divorced  Caucasian  female   G1P1001 with Patient's last menstrual period was 08/09/2019.   here for pelvic ultrasound.   She has had abnormal uterine bleeding and I felt a left adnexal mass at her prior office visit.  She states her vaginal bleeding finally stopped 3 days ago.   She had some cramping and low back pain last weekend and thinks she is ovulating.  Her last Provera dose was a while ago.    GYNECOLOGIC HISTORY: Patient's last menstrual period was 08/09/2019. Contraception: Tubal Menopausal hormone therapy:  n/a Last mammogram: n/a Last pap smear:  03-06-19 ASCUS:Neg HR HPV,01/26/18 ASCUS:Pos HR HPV;03/09/18 Colposcopic biopsies were normal        OB History    Gravida  1   Para  1   Term  1   Preterm      AB      Living  1     SAB      TAB      Ectopic      Multiple      Live Births                 Patient Active Problem List   Diagnosis Date Noted  . Abnormal uterine bleeding 10/26/2018    Past Medical History:  Diagnosis Date  . Abnormal uterine bleeding (AUB)   . Acute hearing loss    Right  . Dizziness   . Elevated AST (SGOT) 10/2016  . Endometrial mass    No biopsy as of 11/01/18.  Hysteroscopy temporarily cancelled for 11/02/18 due to coronavirus.   . Endometriosis   . Left ovarian cyst   . Low ferritin   . Migraines    without aura  . STD (sexually transmitted disease)    HX HSV  . Tinnitus, right ear     Past Surgical History:  Procedure Laterality Date  . ABLATION ON ENDOMETRIOSIS N/A 07/21/2015   Procedure: FULGERATION OF ENDOMETRIOSIS, BIOPSY OF POSTERIOR CUL DE SAC;  Surgeon: Patton Salles, MD;  Location: [facility];  Service: Gynecology;  Laterality: N/A;  . APPENDECTOMY  2004  . AUGMENTATION MAMMAPLASTY  2011  . CESAREAN SECTION  2003  . CHOLECYSTECTOMY  2004  . LAPAROSCOPIC TUBAL LIGATION Right 07/21/2015   Procedure: LAPAROSCOPIC TUBAL LIGATION;  Surgeon: Patton Salles, MD;  Location: [facility];  Service: Gynecology;  Laterality: Right;  . OVARIAN CYST REMOVAL Left 07/21/2015   Procedure: EXCISION PARA-OVARIAN CYST;  Surgeon: Patton Salles, MD;  Location: [facility];  Service: Gynecology;  Laterality: Left;  . UNILATERAL SALPINGECTOMY Left 07/21/2015   Procedure: DISTAL UNILATERAL SALPINGECTOMY;  Surgeon: Patton Salles, MD;  Location: [facility];  Service: Gynecology;  Laterality: Left;    Current Outpatient Medications  Medication Sig Dispense Refill  . Ascorbic Acid (VITAMIN C) 1000 MG tablet Take 1,000 mg by mouth daily.    . Cholecalciferol (VITAMIN D-3 PO) Take 1 tablet by mouth daily.    Marland Kitchen ELDERBERRY PO Take 1 tablet by mouth daily.    . Ferrous Sulfate (SLOW FE) 142 (45 Fe) MG TBCR Take 1 tablet by mouth daily.    . fluticasone (FLONASE) 50 MCG/ACT nasal spray Place 1 spray into both nostrils as needed.    Marland Kitchen ibuprofen (ADVIL,MOTRIN) 200 MG tablet Take 600-800 mg by mouth every 8 (eight) hours as needed.     Marland Kitchen  medroxyPROGESTERone (PROVERA) 10 MG tablet Take 1 tablet (10 mg total) by mouth daily. 10 tablet 0  . Multiple Vitamins-Minerals (MULTI ADULT GUMMIES) CHEW     . SUMAtriptan (IMITREX) 100 MG tablet Take by mouth as needed.    . valACYclovir (VALTREX) 500 MG tablet Take one tablet by mouth daily. 90 tablet 3  . zinc gluconate 50 MG tablet Take 50 mg by mouth daily.     No current facility-administered medications for this visit.     ALLERGIES: Patient has no known allergies.  Family History  Problem Relation Age of Onset  . Colon polyps Mother        multiple pre-cancerous polyps on several colonoscopies  . Diabetes Father   . Hypertension Father   . Hyperlipidemia Father   . Diabetes Paternal Grandmother   . Hyperlipidemia Paternal Grandmother   . Hypertension Paternal Grandmother     Social History   Socioeconomic History  . Marital status: Divorced    Spouse name: Not on file  . Number of children:  Not on file  . Years of education: Not on file  . Highest education level: Not on file  Occupational History  . Not on file  Tobacco Use  . Smoking status: Light Tobacco Smoker    Types: Cigarettes  . Smokeless tobacco: Never Used  Substance and Sexual Activity  . Alcohol use: Yes    Alcohol/week: 2.0 standard drinks    Types: 2 Standard drinks or equivalent per week    Comment: 1-2 beers per week--only socially  . Drug use: No  . Sexual activity: Yes    Partners: Male    Birth control/protection: Surgical    Comment: Tubal  Other Topics Concern  . Not on file  Social History Narrative  . Not on file   Social Determinants of Health   Financial Resource Strain:   . Difficulty of Paying Living Expenses: Not on file  Food Insecurity:   . Worried About Programme researcher, broadcasting/film/video in the Last Year: Not on file  . Ran Out of Food in the Last Year: Not on file  Transportation Needs:   . Lack of Transportation (Medical): Not on file  . Lack of Transportation (Non-Medical): Not on file  Physical Activity:   . Days of Exercise per Week: Not on file  . Minutes of Exercise per Session: Not on file  Stress:   . Feeling of Stress : Not on file  Social Connections:   . Frequency of Communication with Friends and Family: Not on file  . Frequency of Social Gatherings with Friends and Family: Not on file  . Attends Religious Services: Not on file  . Active Member of Clubs or Organizations: Not on file  . Attends Banker Meetings: Not on file  . Marital Status: Not on file  Intimate Partner Violence:   . Fear of Current or Ex-Partner: Not on file  . Emotionally Abused: Not on file  . Physically Abused: Not on file  . Sexually Abused: Not on file    Review of Systems  All other systems reviewed and are negative.   PHYSICAL EXAMINATION:    BP 100/62   Pulse 66   Temp (!) 97.2 F (36.2 C) (Temporal)   Ht 5' 4.5" (1.638 m)   Wt 128 lb (58.1 kg)   LMP 08/09/2019   BMI  21.63 kg/m     General appearance: alert, cooperative and appears stated age  Pelvic US  Uterus no  masses.  EMS 6/15 mm. Cervical mass 3 x 4 mm with possible feeder vessel.   Right ovary with 22 mm cyst, possible CL.  Left ovary normal.  No free fluid.   ASSESSMENT  Abnormal uterine bleeding resolved.  We discussed anovulation, ovarian cysts, and endometriosis as potential causes for the bleeding.  Right CL cyst.    PLAN  Reassurance regarding normal US findings reviewed with the patient by phone today, as I was not in the office on 09/05/19, the day of her ultrasound visit.  Will do observational management of her bleeding at this time.  No hysteroscopy is needed.  Fu for annual exam and prn.

## 2019-09-10 ENCOUNTER — Encounter: Payer: Self-pay | Admitting: Obstetrics and Gynecology

## 2019-11-05 ENCOUNTER — Ambulatory Visit: Payer: BC Managed Care – PPO | Admitting: Physician Assistant

## 2019-11-05 ENCOUNTER — Other Ambulatory Visit: Payer: Self-pay

## 2019-11-05 ENCOUNTER — Encounter: Payer: Self-pay | Admitting: Physician Assistant

## 2019-11-05 VITALS — BP 102/60 | HR 64 | Temp 97.5°F | Resp 16 | Wt 130.0 lb

## 2019-11-05 DIAGNOSIS — J309 Allergic rhinitis, unspecified: Secondary | ICD-10-CM | POA: Insufficient documentation

## 2019-11-05 DIAGNOSIS — J3089 Other allergic rhinitis: Secondary | ICD-10-CM

## 2019-11-05 MED ORDER — TRIAMCINOLONE ACETONIDE 40 MG/ML IJ SUSP
60.0000 mg | Freq: Once | INTRAMUSCULAR | Status: AC
  Administered 2019-11-05: 60 mg via INTRAMUSCULAR

## 2019-11-05 NOTE — Assessment & Plan Note (Signed)
Continue current meds as directed Kenalog 60mg  given IM

## 2019-11-05 NOTE — Progress Notes (Signed)
Acute Office Visit  Subjective:    Patient ID: Ann Whitehead, female    DOB: 11-Jun-1981, 39 y.o.   MRN: [id number]  Chief Complaint  Patient presents with  . Allergies    HPI Patient is in today for complaints of allergies Pt is currently taking claritin and flonase for her allergy symptoms - she has runny nose, stopped up , and watery eyes - she usually gets kenalog injection every season which helps controls her symptoms Denies fever, cough, malaise  Past Medical History:  Diagnosis Date  . Abnormal uterine bleeding (AUB)   . Acute hearing loss    Right  . Dizziness   . Elevated AST (SGOT) 10/2016  . Endometriosis   . Left ovarian cyst   . Low ferritin   . Migraines    without aura  . STD (sexually transmitted disease)    HX HSV  . Tinnitus, right ear     Past Surgical History:  Procedure Laterality Date  . ABLATION ON ENDOMETRIOSIS N/A 07/21/2015   Procedure: FULGERATION OF ENDOMETRIOSIS, BIOPSY OF POSTERIOR CUL DE [place];  Surgeon: Nunzio Cobbs, MD;  Location: [facility];  Service: Gynecology;  Laterality: N/A;  . APPENDECTOMY  2004  . AUGMENTATION MAMMAPLASTY  2011  . CESAREAN SECTION  2003  . CHOLECYSTECTOMY  2004  . LAPAROSCOPIC TUBAL LIGATION Right 07/21/2015   Procedure: LAPAROSCOPIC TUBAL LIGATION;  Surgeon: Nunzio Cobbs, MD;  Location: [facility];  Service: Gynecology;  Laterality: Right;  . OVARIAN CYST REMOVAL Left 07/21/2015   Procedure: EXCISION PARA-OVARIAN CYST;  Surgeon: Nunzio Cobbs, MD;  Location: [facility];  Service: Gynecology;  Laterality: Left;  . UNILATERAL SALPINGECTOMY Left 07/21/2015   Procedure: DISTAL UNILATERAL SALPINGECTOMY;  Surgeon: Nunzio Cobbs, MD;  Location: [facility];  Service: Gynecology;  Laterality: Left;    Family History  Problem Relation Age of Onset  . Colon polyps Mother        multiple pre-cancerous polyps on several colonoscopies  . Diabetes Father   . Hypertension Father   .  Hyperlipidemia Father   . Diabetes Paternal Grandmother   . Hyperlipidemia Paternal Grandmother   . Hypertension Paternal Grandmother     Social History   Socioeconomic History  . Marital status: Divorced    Spouse name: Not on file  . Number of children: Not on file  . Years of education: Not on file  . Highest education level: Not on file  Occupational History  . Not on file  Tobacco Use  . Smoking status: Light Tobacco Smoker    Types: Cigarettes  . Smokeless tobacco: Never Used  Substance and Sexual Activity  . Alcohol use: Yes    Alcohol/week: 2.0 standard drinks    Types: 2 Standard drinks or equivalent per week    Comment: 1-2 beers per week--only socially  . Drug use: No  . Sexual activity: Yes    Partners: Male    Birth control/protection: Surgical    Comment: Tubal  Other Topics Concern  . Not on file  Social History Narrative  . Not on file   Social Determinants of Health   Financial Resource Strain:   . Difficulty of Paying Living Expenses:   Food Insecurity:   . Worried About Charity fundraiser in the Last Year:   . Arboriculturist in the Last Year:   Transportation Needs:   . Film/video editor (Medical):   Marland Kitchen  Lack of Transportation (Non-Medical):   Physical Activity:   . Days of Exercise per Week:   . Minutes of Exercise per Session:   Stress:   . Feeling of Stress :   Social Connections:   . Frequency of Communication with Friends and Family:   . Frequency of Social Gatherings with Friends and Family:   . Attends Religious Services:   . Active Member of Clubs or Organizations:   . Attends Banker Meetings:   Marland Kitchen Marital Status:   Intimate Partner Violence:   . Fear of Current or Ex-Partner:   . Emotionally Abused:   Marland Kitchen Physically Abused:   . Sexually Abused:      Current Outpatient Medications:  .  loratadine (CLARITIN) 10 MG tablet, Take 10 mg by mouth daily., Disp: , Rfl:  .  Ascorbic Acid (VITAMIN C) 1000 MG tablet,  Take 1,000 mg by mouth daily., Disp: , Rfl:  .  Cholecalciferol (VITAMIN D-3 PO), Take 1 tablet by mouth daily., Disp: , Rfl:  .  ELDERBERRY PO, Take 1 tablet by mouth daily., Disp: , Rfl:  .  fluticasone (FLONASE) 50 MCG/ACT nasal spray, Place 1 spray into both nostrils as needed., Disp: , Rfl:  .  ibuprofen (ADVIL,MOTRIN) 200 MG tablet, Take 600-800 mg by mouth every 8 (eight) hours as needed. , Disp: , Rfl:  .  Multiple Vitamins-Minerals (MULTI ADULT GUMMIES) CHEW, , Disp: , Rfl:  .  SUMAtriptan (IMITREX) 100 MG tablet, Take by mouth as needed., Disp: , Rfl:  .  valACYclovir (VALTREX) 500 MG tablet, Take one tablet by mouth daily., Disp: 90 tablet, Rfl: 3 .  zinc gluconate 50 MG tablet, Take 50 mg by mouth daily., Disp: , Rfl:    No Known Allergies  CONSTITUTIONAL: Negative for chills, fatigue, fever, unintentional weight gain and unintentional weight loss.  E/N/T: see HPI CARDIOVASCULAR: Negative for chest pain, dizziness, palpitations and pedal edema.  RESPIRATORY: Negative for recent cough and dyspnea.   PSYCHIATRIC: Negative for sleep disturbance and to question depression screen.  Negative for depression, negative for anhedonia.         Objective:    PHYSICAL EXAM:   VS: BP 102/60   Pulse 64   Temp (!) 97.5 F (36.4 C)   Resp 16   Wt 130 lb (59 kg)   SpO2 98%   BMI 21.97 kg/m   GEN: Well nourished, well developed, in no acute distress  HEENT: normal external ears and nose - normal external auditory canals and TMS - hearing grossly normal - nasal mucosa swollen - Lips, Teeth and Gums - normal  Oropharynx - normal mucosa, palate, and posterior pharynx  Cardiac: RRR; no murmurs, rubs, or gallops,no edema - no significant varicosities Respiratory:  normal respiratory rate and pattern with no distress - normal breath sounds with no rales, rhonchi, wheezes or rubs  Psych: euthymic mood, appropriate affect and demeanor   Wt Readings from Last 3 Encounters:  11/05/19 130  lb (59 kg)  09/05/19 128 lb (58.1 kg)  09/02/19 128 lb 9.6 oz (58.3 kg)    There are no preventive care reminders to display for this patient.  There are no preventive care reminders to display for this patient.        Assessment & Plan:   Problem List Items Addressed This Visit      Respiratory   Allergic rhinitis due to allergen - Primary    Continue current meds as directed Kenalog 60mg  given IM  Meds ordered this encounter  Medications  . triamcinolone acetonide (KENALOG-40) injection 60 mg     SARA R Martina Brodbeck, PA-C

## 2019-11-22 DIAGNOSIS — Z20822 Contact with and (suspected) exposure to covid-19: Secondary | ICD-10-CM | POA: Diagnosis not present

## 2019-11-23 DIAGNOSIS — Z03818 Encounter for observation for suspected exposure to other biological agents ruled out: Secondary | ICD-10-CM | POA: Diagnosis not present

## 2019-11-23 DIAGNOSIS — Z20822 Contact with and (suspected) exposure to covid-19: Secondary | ICD-10-CM | POA: Diagnosis not present

## 2019-11-29 DIAGNOSIS — Z03818 Encounter for observation for suspected exposure to other biological agents ruled out: Secondary | ICD-10-CM | POA: Diagnosis not present

## 2019-11-29 DIAGNOSIS — Z20822 Contact with and (suspected) exposure to covid-19: Secondary | ICD-10-CM | POA: Diagnosis not present

## 2019-12-05 DIAGNOSIS — Z20828 Contact with and (suspected) exposure to other viral communicable diseases: Secondary | ICD-10-CM | POA: Diagnosis not present

## 2019-12-05 DIAGNOSIS — Z20822 Contact with and (suspected) exposure to covid-19: Secondary | ICD-10-CM | POA: Diagnosis not present

## 2019-12-09 ENCOUNTER — Telehealth: Payer: Self-pay | Admitting: Obstetrics and Gynecology

## 2019-12-09 NOTE — Telephone Encounter (Signed)
Patient called to speak with Noreene Larsson regarding making an appointment.

## 2019-12-09 NOTE — Telephone Encounter (Signed)
Spoke with patient. Menses 11/22/19 -11/29/19, bleeding started again on 12/04/19, has not stopped. Reports flow is "moderate", changing saturated pad with clots q1.5 -2hrs and cramping. BTL for contraceptive. Patient was seen 09/05/19 for PUS for AUB. Patient reports menses had been consistent until this month. Denies fatigue, SOB, weakness, lightheadedness, dizziness.   Advised patient OV needed for further evaluation. Patient declined OV for today at 4pm and on 4/29 at 1pm due to her work schedule. Advised patient repeat PUS may be recommended, can schedule morning of 4/29, patient declined. Patient states she has just returned to work, was on quarantine due to [employer] exposure. She will be traveling for the whole month of May, leaving next Monday.   Patient will review her work schedule and return call to office for OV. ER precautions reviewed for heavy bleeding or new/worsening symptoms. Advised I will review with Dr. Edward Jolly and return call if any additional recommendations, patient agreeable.   Routing to Dr. Edward Jolly for review.

## 2019-12-09 NOTE — Telephone Encounter (Signed)
Left message to call Noreene Larsson, RN at [facility] [phone].   Offered OV for today at 4pm or 4/29 at 1pm with Dr. Edward Jolly.

## 2019-12-09 NOTE — Telephone Encounter (Signed)
Patient says she is having some abnormal bleeding.

## 2019-12-09 NOTE — Telephone Encounter (Signed)
Spoke with patient. OV scheduled for 12/25/19 at 10:30am with Dr. Edward Jolly. Patient declined multiple earlier OV due to her work schedule. Advised patient to continue to monitor bleeding and return call if bleeding becomes heavier or new symptoms develop. ER precautions again reviewed. Patient verbalizes understanding and is agreeable.   Routing to provider for final review. Patient is agreeable to disposition. Will close encounter.

## 2019-12-20 ENCOUNTER — Telehealth: Payer: Self-pay

## 2019-12-20 NOTE — Telephone Encounter (Signed)
Patient called in regards to cancelling appointment on (12/28/19) for AUB. Patient did not want to reschedule. Patient stated "problem has corrected itself".

## 2019-12-22 NOTE — Telephone Encounter (Signed)
Encounter reviewed and closed.  

## 2019-12-24 ENCOUNTER — Ambulatory Visit: Payer: Self-pay | Admitting: Obstetrics and Gynecology

## 2019-12-25 ENCOUNTER — Ambulatory Visit: Payer: Self-pay | Admitting: Obstetrics and Gynecology

## 2020-01-06 ENCOUNTER — Telehealth: Payer: Self-pay

## 2020-01-06 NOTE — Telephone Encounter (Signed)
Keep office visit with me.  No follow up pelvic US needed at this time.

## 2020-01-06 NOTE — Telephone Encounter (Signed)
Pt will keep OV on 6/3 at 8am. Orders for PUS cancelled.   Encounter closed.

## 2020-01-06 NOTE — Telephone Encounter (Signed)
H/O AUB  Has PUS 09/05/2019 normal results  Spoke with pt. Pt states having menses 4/9-4/16, 4/21-4/29, had only light spotting 4/29-5/11 and then restarted with moderate to heavy flow intermittently from 5/11 to current day. Pt had appt on 5/15 and cancelled appt due to thinking bleeding had stopped.  Pt states does have intermittent cramps that she takes OTC ibuprofen which helps relieve. Pt states changing regular tampon every 2-3 hours with intermittent clots. Denies fatigue, SOB, weakness, lightheadedness and dizziness. Advised pt to have OV for further evaluation. Pt agreeable. Pt states is out of town for work and will need OV next week. Pt requesting early morning due to work schedule. Declines earlier offered appts and work-in. Advised pt repeat PUS may be recommended. Pt scheduled for 01/16/20 at 8 am with Dr Edward Jolly.  ER precautions reviewed for heavy bleeding or new/worsening sx. Advised will review with Dr Edward Jolly and return call if any additional recommendations. Pt agreeable.   Routing to Dr Edward Jolly for review.  Please advise if needs repeat PUS same day.  Orders pended for PUS.

## 2020-01-06 NOTE — Telephone Encounter (Signed)
Patient is calling in regards to irregular periods. Patient stated she believed the issue had resolved itself but it has started back up again.

## 2020-01-15 ENCOUNTER — Other Ambulatory Visit: Payer: Self-pay

## 2020-01-15 NOTE — Progress Notes (Signed)
GYNECOLOGY  VISIT   HPI: 39 y.o.   Divorced  Caucasian  female   G1P1001 with Patient's last menstrual period was 12/24/2019 (exact date).   here for abnormal uterine bleeding.    Patient states bled 4-9 thru 4-16, 4-21 thru 4-29, 5-11 thru 5-29 until bleeding picked up and began heavy until now. Patient has been having severe cramping along with the irregular bleeding. Feeling bloated.  She states these have been like regular menses.  Prior to this, her bleeding was as follows: Stopped bleeding on 09/06/19.  Next menses 2/8 - 2/19. Then 3/10 - 3/18.  Has not taken provera in a while.   Prior EMB 11/02/18 showed minute polyp of the endometrium.  Sonohysterogram 02/07/2019 in follow up showed no endometrial filling defects.  Has periods of dizziness and lightheadedness, but wonders if it is because she is standing up too quickly.  Working a lot of hours and traveling for work.  Last Hgb was 12.3 and ferritin was 10 on 09/02/19.   Prefers to not take mediction daily.  Used birth control pills and the NuvaRing in past.  She had irregular bleeding with NuvaRing.   UPT - negative  Son graduated form HS.  Engaged to be married in May, 2022.  No plans for childbearing.  Had a tubal.  Fiance is 37 yo.   GYNECOLOGIC HISTORY: Patient's last menstrual period was 12/24/2019 (exact date). Contraception: Tubal Menopausal hormone therapy:  n/a Last mammogram:  n/a Last pap smear: 03-06-19 ASCUS:Neg HR HPV,01/26/18 ASCUS:Pos HR HPV;03/09/18 Colposcopic biopsies were normal        OB History    Gravida  1   Para  1   Term  1   Preterm      AB      Living  1     SAB      TAB      Ectopic      Multiple      Live Births                 Patient Active Problem List   Diagnosis Date Noted  . Allergic rhinitis due to allergen 11/05/2019  . Abnormal uterine bleeding 10/26/2018    Past Medical History:  Diagnosis Date  . Abnormal uterine bleeding (AUB)   . Acute  hearing loss    Right  . Dizziness   . Elevated AST (SGOT) 10/2016  . Endometriosis   . Left ovarian cyst   . Low ferritin   . Migraines    without aura  . STD (sexually transmitted disease)    HX HSV  . Tinnitus, right ear     Past Surgical History:  Procedure Laterality Date  . ABLATION ON ENDOMETRIOSIS N/A 07/21/2015   Procedure: FULGERATION OF ENDOMETRIOSIS, BIOPSY OF POSTERIOR CUL DE [place];  Surgeon: Patton Salles, MD;  Location: [facility];  Service: Gynecology;  Laterality: N/A;  . APPENDECTOMY  2004  . AUGMENTATION MAMMAPLASTY  2011  . CESAREAN SECTION  2003  . CHOLECYSTECTOMY  2004  . LAPAROSCOPIC TUBAL LIGATION Right 07/21/2015   Procedure: LAPAROSCOPIC TUBAL LIGATION;  Surgeon: Patton Salles, MD;  Location: [facility];  Service: Gynecology;  Laterality: Right;  . OVARIAN CYST REMOVAL Left 07/21/2015   Procedure: EXCISION PARA-OVARIAN CYST;  Surgeon: Patton Salles, MD;  Location: [facility];  Service: Gynecology;  Laterality: Left;  . UNILATERAL SALPINGECTOMY Left 07/21/2015   Procedure: DISTAL UNILATERAL SALPINGECTOMY;  Surgeon: Debbe Bales  E Ardell Isaacs, MD;  Location: [facility];  Service: Gynecology;  Laterality: Left;    Current Outpatient Medications  Medication Sig Dispense Refill  . Ascorbic Acid (VITAMIN C) 1000 MG tablet Take 1,000 mg by mouth daily.    . Cholecalciferol (VITAMIN D-3 PO) Take 1 tablet by mouth daily.    Marland Kitchen ELDERBERRY PO Take 1 tablet by mouth daily.    . fluticasone (FLONASE) 50 MCG/ACT nasal spray Place 1 spray into both nostrils as needed.    Marland Kitchen ibuprofen (ADVIL,MOTRIN) 200 MG tablet Take 600-800 mg by mouth every 8 (eight) hours as needed.     . loratadine (CLARITIN) 10 MG tablet Take 10 mg by mouth daily.    . Multiple Vitamins-Minerals (MULTI ADULT GUMMIES) CHEW     . SUMAtriptan (IMITREX) 100 MG tablet Take by mouth as needed.    . valACYclovir (VALTREX) 500 MG tablet Take one tablet by mouth daily. 90 tablet 3  . zinc  gluconate 50 MG tablet Take 50 mg by mouth daily.     No current facility-administered medications for this visit.     ALLERGIES: Patient has no known allergies.  Family History  Problem Relation Age of Onset  . Colon polyps Mother        multiple pre-cancerous polyps on several colonoscopies  . Diabetes Father   . Hypertension Father   . Hyperlipidemia Father   . Diabetes Paternal Grandmother   . Hyperlipidemia Paternal Grandmother   . Hypertension Paternal Grandmother     Social History   Socioeconomic History  . Marital status: Divorced    Spouse name: Not on file  . Number of children: Not on file  . Years of education: Not on file  . Highest education level: Not on file  Occupational History  . Not on file  Tobacco Use  . Smoking status: Light Tobacco Smoker    Types: Cigarettes  . Smokeless tobacco: Never Used  Substance and Sexual Activity  . Alcohol use: Yes    Alcohol/week: 2.0 standard drinks    Types: 2 Standard drinks or equivalent per week    Comment: 1-2 beers per week--only socially  . Drug use: No  . Sexual activity: Yes    Partners: Male    Birth control/protection: Surgical    Comment: Tubal  Other Topics Concern  . Not on file  Social History Narrative  . Not on file   Social Determinants of Health   Financial Resource Strain:   . Difficulty of Paying Living Expenses:   Food Insecurity:   . Worried About Programme researcher, broadcasting/film/video in the Last Year:   . Barista in the Last Year:   Transportation Needs:   . Freight forwarder (Medical):   Marland Kitchen Lack of Transportation (Non-Medical):   Physical Activity:   . Days of Exercise per Week:   . Minutes of Exercise per Session:   Stress:   . Feeling of Stress :   Social Connections:   . Frequency of Communication with Friends and Family:   . Frequency of Social Gatherings with Friends and Family:   . Attends Religious Services:   . Active Member of Clubs or Organizations:   . Attends Occupational hygienist Meetings:   Marland Kitchen Marital Status:   Intimate Partner Violence:   . Fear of Current or Ex-Partner:   . Emotionally Abused:   Marland Kitchen Physically Abused:   . Sexually Abused:     Review of Systems  All other systems reviewed and are negative.   PHYSICAL EXAMINATION:    BP 100/70   Pulse 60   Temp (!) 97.3 F (36.3 C) (Temporal)   Resp 14   Ht 5' 4.5" (1.638 m)   Wt 126 lb (57.2 kg)   LMP 12/24/2019 (Exact Date)   BMI 21.29 kg/m     General appearance: alert, cooperative and appears stated age   Pelvic: External genitalia:  no lesions              Urethra:  normal appearing urethra with no masses, tenderness or lesions              Bartholins and Skenes: normal                 Vagina: normal appearing vagina with normal color and discharge, no lesions              Cervix: no lesions/  Menstrual flow from the os.                 Bimanual Exam:  Uterus:  normal size, contour, position, consistency, mobility, non-tender              Adnexa: 3 cm mobile mass on right.  No mass, fullness, tenderness on left.               Chaperone was present for exam.  ASSESSMENT  Abnormal uterine bleeding. Recurrent. Right adnexal mass.  Pelvic pain.  Hx anovulatory bleeding and endometriosis.  Status post BTL.  Hx low ferritin.   PLAN  We reviewed reasons for the bleeding - polyps, endometriosis, cysts.  We discussed potential tx options to control bleeding and pain - POPs. COCs, NuvaRing, OrthoEvra, Mirena IUD, hysteroscopy/laparoscopy, hysterectomy.  She is leaning toward COCs. Provera 10 mg x 10 days.  Return for pelvic US.    An After Visit Summary was printed and given to the patient.  __30____ minutes face to face time of which over 50% was spent in counseling.

## 2020-01-16 ENCOUNTER — Ambulatory Visit (INDEPENDENT_AMBULATORY_CARE_PROVIDER_SITE_OTHER): Payer: BC Managed Care – PPO | Admitting: Obstetrics and Gynecology

## 2020-01-16 ENCOUNTER — Ambulatory Visit: Payer: BC Managed Care – PPO | Admitting: Obstetrics and Gynecology

## 2020-01-16 ENCOUNTER — Encounter: Payer: Self-pay | Admitting: Obstetrics and Gynecology

## 2020-01-16 ENCOUNTER — Ambulatory Visit (INDEPENDENT_AMBULATORY_CARE_PROVIDER_SITE_OTHER): Payer: BC Managed Care – PPO

## 2020-01-16 VITALS — BP 100/70 | HR 60 | Temp 97.3°F | Ht 64.5 in | Wt 126.0 lb

## 2020-01-16 VITALS — BP 100/70 | HR 60 | Temp 97.3°F | Resp 14 | Ht 64.5 in | Wt 126.0 lb

## 2020-01-16 DIAGNOSIS — N83202 Unspecified ovarian cyst, left side: Secondary | ICD-10-CM

## 2020-01-16 DIAGNOSIS — N9489 Other specified conditions associated with female genital organs and menstrual cycle: Secondary | ICD-10-CM | POA: Diagnosis not present

## 2020-01-16 DIAGNOSIS — N939 Abnormal uterine and vaginal bleeding, unspecified: Secondary | ICD-10-CM | POA: Diagnosis not present

## 2020-01-16 LAB — POCT URINE PREGNANCY: Preg Test, Ur: NEGATIVE

## 2020-01-16 MED ORDER — DROSPIRENONE-ETHINYL ESTRADIOL 3-0.03 MG PO TABS: 1.0000 | ORAL_TABLET | Freq: Every day | ORAL | 0 refills | Status: DC

## 2020-01-16 MED ORDER — MEDROXYPROGESTERONE ACETATE 10 MG PO TABS: 10.0000 mg | ORAL_TABLET | Freq: Every day | ORAL | 0 refills | Status: DC

## 2020-01-16 NOTE — Progress Notes (Signed)
Patient scheduled for PUS today at 4pm, consult to follow with Dr. Edward Jolly. Patient verbalizes understanding and is agreeable. Order placed for precert, business office notified.

## 2020-01-16 NOTE — Progress Notes (Signed)
GYNECOLOGY  VISIT   HPI: 39 y.o.   Divorced  Caucasian  female   G1P1001 with Patient's last menstrual period was 12/24/2019 (exact date).   here for pelvic ultrasound.    Seen this am for abnormal uterine bleeding and was felt to have an ovarian cyst.   Having low back pain and cramping.  GYNECOLOGIC HISTORY: Patient's last menstrual period was 12/24/2019 (exact date). Contraception:  Tubal Menopausal hormone therapy:  n/a Last mammogram:  n/a Last pap smear:  03-06-19 ASCUS:Neg HR HPV,01/26/18 ASCUS:Pos HR HPV;03/09/18 Colposcopic biopsies were normal        OB History    Gravida  1   Para  1   Term  1   Preterm      AB      Living  1     SAB      TAB      Ectopic      Multiple      Live Births                 Patient Active Problem List   Diagnosis Date Noted  . Allergic rhinitis due to allergen 11/05/2019  . Abnormal uterine bleeding 10/26/2018    Past Medical History:  Diagnosis Date  . Abnormal uterine bleeding (AUB)   . Acute hearing loss    Right  . Dizziness   . Elevated AST (SGOT) 10/2016  . Endometriosis   . Left ovarian cyst   . Low ferritin   . Migraines    without aura  . STD (sexually transmitted disease)    HX HSV  . Tinnitus, right ear     Past Surgical History:  Procedure Laterality Date  . ABLATION ON ENDOMETRIOSIS N/A 07/21/2015   Procedure: FULGERATION OF ENDOMETRIOSIS, BIOPSY OF POSTERIOR CUL DE [place];  Surgeon: Nunzio Cobbs, MD;  Location: [facility];  Service: Gynecology;  Laterality: N/A;  . APPENDECTOMY  2004  . AUGMENTATION MAMMAPLASTY  2011  . CESAREAN SECTION  2003  . CHOLECYSTECTOMY  2004  . LAPAROSCOPIC TUBAL LIGATION Right 07/21/2015   Procedure: LAPAROSCOPIC TUBAL LIGATION;  Surgeon: Nunzio Cobbs, MD;  Location: [facility];  Service: Gynecology;  Laterality: Right;  . OVARIAN CYST REMOVAL Left 07/21/2015   Procedure: EXCISION PARA-OVARIAN CYST;  Surgeon: Nunzio Cobbs, MD;   Location: [facility];  Service: Gynecology;  Laterality: Left;  . UNILATERAL SALPINGECTOMY Left 07/21/2015   Procedure: DISTAL UNILATERAL SALPINGECTOMY;  Surgeon: Nunzio Cobbs, MD;  Location: [facility];  Service: Gynecology;  Laterality: Left;    Current Outpatient Medications  Medication Sig Dispense Refill  . Ascorbic Acid (VITAMIN C) 1000 MG tablet Take 1,000 mg by mouth daily.    . Cholecalciferol (VITAMIN D-3 PO) Take 1 tablet by mouth daily.    Marland Kitchen ELDERBERRY PO Take 1 tablet by mouth daily.    . fluticasone (FLONASE) 50 MCG/ACT nasal spray Place 1 spray into both nostrils as needed.    Marland Kitchen ibuprofen (ADVIL,MOTRIN) 200 MG tablet Take 600-800 mg by mouth every 8 (eight) hours as needed.     . loratadine (CLARITIN) 10 MG tablet Take 10 mg by mouth daily.    . Multiple Vitamins-Minerals ([place]) CHEW     . SUMAtriptan (IMITREX) 100 MG tablet Take by mouth as needed.    . valACYclovir (VALTREX) 500 MG tablet Take one tablet by mouth daily. 90 tablet 3  . zinc gluconate 50 MG  tablet Take 50 mg by mouth daily.    . drospirenone-ethinyl estradiol (YASMIN) 3-0.03 MG tablet Take 1 tablet by mouth daily. 3 Package 0   No current facility-administered medications for this visit.     ALLERGIES: Patient has no known allergies.  Family History  Problem Relation Age of Onset  . Colon polyps Mother        multiple pre-cancerous polyps on several colonoscopies  . Diabetes Father   . Hypertension Father   . Hyperlipidemia Father   . Diabetes Paternal Grandmother   . Hyperlipidemia Paternal Grandmother   . Hypertension Paternal Grandmother     Social History   Socioeconomic History  . Marital status: Divorced    Spouse name: Not on file  . Number of children: Not on file  . Years of education: Not on file  . Highest education level: Not on file  Occupational History  . Not on file  Tobacco Use  . Smoking status: Light Tobacco Smoker    Types: Cigarettes  . Smokeless  tobacco: Never Used  Substance and Sexual Activity  . Alcohol use: Yes    Alcohol/week: 2.0 standard drinks    Types: 2 Standard drinks or equivalent per week    Comment: 1-2 beers per week--only socially  . Drug use: No  . Sexual activity: Yes    Partners: Male    Birth control/protection: Surgical    Comment: Tubal  Other Topics Concern  . Not on file  Social History Narrative  . Not on file   Social Determinants of Health   Financial Resource Strain:   . Difficulty of Paying Living Expenses:   Food Insecurity:   . Worried About Programme researcher, broadcasting/film/video in the Last Year:   . Barista in the Last Year:   Transportation Needs:   . Freight forwarder (Medical):   Marland Kitchen Lack of Transportation (Non-Medical):   Physical Activity:   . Days of Exercise per Week:   . Minutes of Exercise per Session:   Stress:   . Feeling of Stress :   Social Connections:   . Frequency of Communication with Friends and Family:   . Frequency of Social Gatherings with Friends and Family:   . Attends Religious Services:   . Active Member of Clubs or Organizations:   . Attends Banker Meetings:   Marland Kitchen Marital Status:   Intimate Partner Violence:   . Fear of Current or Ex-Partner:   . Emotionally Abused:   Marland Kitchen Physically Abused:   . Sexually Abused:     Review of Systems  All other systems reviewed and are negative.   PHYSICAL EXAMINATION:    BP 100/70   Pulse 60   Temp (!) 97.3 F (36.3 C) (Temporal)   Ht 5' 4.5" (1.638 m)   Wt 126 lb (57.2 kg)   LMP 12/24/2019 (Exact Date)   BMI 21.29 kg/m     General appearance: alert, cooperative and appears stated age   Pelvic US Uterus no masses.  EMS 3.46 mm. Left ovarian cyst - 62 x 47 x 47 mm, thin walled. Right ovary normal.  No adnexal masses.  No free fluid.  ASSESSMENT  Abnormal uterine bleeding.  Left ovarian cyst.  Status post bilateral tubal ligation.   PLAN  We discussed the left ovarian cyst.  She will  start Yasmin OCPs for cycle regulation. Torsion precautions given. FU [place] in 6 weeks.    An After Visit Summary was printed and  given to the patient.

## 2020-01-20 ENCOUNTER — Telehealth: Payer: Self-pay | Admitting: Obstetrics and Gynecology

## 2020-01-20 DIAGNOSIS — N83202 Unspecified ovarian cyst, left side: Secondary | ICD-10-CM

## 2020-01-20 NOTE — Telephone Encounter (Signed)
Call to patient. Per DPR, OK to leave message on voicemail.   Left voicemail requesting a return call to Hayley to review benefits and schedule recommended Pelvic ultrasound with Brook A. Silva, MD, FACOG 

## 2020-01-22 ENCOUNTER — Ambulatory Visit (INDEPENDENT_AMBULATORY_CARE_PROVIDER_SITE_OTHER): Payer: BC Managed Care – PPO

## 2020-01-22 ENCOUNTER — Telehealth: Payer: Self-pay | Admitting: *Deleted

## 2020-01-22 ENCOUNTER — Other Ambulatory Visit: Payer: Self-pay

## 2020-01-22 DIAGNOSIS — N83202 Unspecified ovarian cyst, left side: Secondary | ICD-10-CM

## 2020-01-22 DIAGNOSIS — R102 Pelvic and perineal pain: Secondary | ICD-10-CM | POA: Diagnosis not present

## 2020-01-22 NOTE — Telephone Encounter (Signed)
I do recommend the follow up ultrasound in 6 weeks because we are looking to see if the cyst increases in size, decreases in size, or goes away.   If her pain becomes unmanageable, I recommend she return so we can determine if she needs laparoscopic surgery for the ovarian cyst.

## 2020-01-22 NOTE — Telephone Encounter (Signed)
I recommend visit to the [facility] ER now to rule out ovarian torsion.

## 2020-01-22 NOTE — Telephone Encounter (Signed)
Last OV and PUS on 01/16/20- left ovarian cyst, placed on Yasmin OCP. BTL -2016  Spoke with pt. Pt reports  since last Friday 6/4, that she is experiencing sleep disturbance, feeling fatigued, "brain fog", intermittent dizziness due to cramping/abd pain and low back pain that she rates as 7-8 without taking Ibuprofen around the clock. Pt states having pain as 5 with 800mg  Ibuprofen and extra strength Tylenol every 4-6 hours. Pt states with pain " its hard to function" and pain never resolves completley. Denies fever, chills, heavy bleeding or clots.   Pt states still bleeding, but only changing a regular tampon 2-3 times a day. Not soaking a tampon. Pt states has been taking Yasmin and not skipping or missing any pills.   Last Ferritin was low in 08/2019- 10, pt states not taking Iron supplement.   Pt wanting to know what to do for pain. Advised will review with Dr 09/2019 and return call to pt with recommendations and advice. Pt agreeable.   Routing to Dr Edward Jolly.

## 2020-01-22 NOTE — Telephone Encounter (Signed)
-----   Message from Patton Salles, MD sent at 01/22/2020  1:46 PM EDT ----- Please let patient know that her cyst of the left ovary is stable and that here is no sign of torsion.  I recommend she decrease her activity and take the ibuprofen.

## 2020-01-22 NOTE — Telephone Encounter (Signed)
Spoke with Associate Professor at [employer]. Pt scheduled for STAT PUS to rule out left ovarian torsion at 10am.   Call placed to pt. Pt given scheduled PUS and requests address in mychart. Pt agreeable and verbalized understanding.   Routing to Dr Edward Jolly for review.  Encounter closed.  Orders placed.

## 2020-01-22 NOTE — Telephone Encounter (Signed)
Spoke with pt. Pt given recommendations per Dr Edward Jolly. Pt requesting to go to [facility] or any imaging place  for PUS to rule out ovarian torsion. Advised will review with Dr Edward Jolly.  Reviewed with Dr Edward Jolly. Pt ok to use IMG center for PUS.

## 2020-01-22 NOTE — Telephone Encounter (Signed)
Call to patient. Message given to patient as seen below from Dr. Edward Jolly and patient verbalized understanding. Patient asking if she still needs to schedule a 6 week follow up appointment since PUS was stable? RN advised would review with Dr. Edward Jolly and return call with recommendations. Patient verbalized understanding.   Routing to provider for review.

## 2020-01-22 NOTE — Telephone Encounter (Signed)
Patient says bleeding and pain has gotten worse.

## 2020-01-22 NOTE — Telephone Encounter (Signed)
Message left to return call to Triage Nurse at [phone].    

## 2020-01-23 NOTE — Telephone Encounter (Signed)
Spoke with pt. Pt given results and recommendations per Dr Edward Jolly. Pt agreeable to repeat PUS in 6 weeks. Pt verbalized understanding. Pt reports tired today due to work schedule, but better with pain while taking Ibuprofen. Pt aware to call if pain becomes worse and unmanageable.   Pt scheduled for 6 weeks PUS on 03/05/20 at 8:30 am. Pt verbalized understanding and is agreeable.   Routing to Dr Edward Jolly for update.  Encounter closed.  Cc: [facility] for precert. Orders placed

## 2020-01-23 NOTE — Telephone Encounter (Signed)
Patient returned call for results. Ann Whitehead not in office today and sending to triage.

## 2020-02-19 ENCOUNTER — Telehealth: Payer: Self-pay

## 2020-02-19 NOTE — Telephone Encounter (Signed)
Received faxed refill request from [facility] for the following:   Medication refill request: Valacyclovir 500mg  #90,R3 Last AEX: 7--22-20 Next AEX: 03-11-20 Last MMG (if hormonal medication request): n/a Refill authorized: Called patient and left message to return call to [place], [place]. Does she need refill before AEX?

## 2020-02-20 ENCOUNTER — Other Ambulatory Visit: Payer: Self-pay | Admitting: Obstetrics and Gynecology

## 2020-02-20 MED ORDER — VALACYCLOVIR HCL 500 MG PO TABS: ORAL_TABLET | ORAL | 0 refills | Status: DC

## 2020-02-20 NOTE — Telephone Encounter (Signed)
Refill for Valtrex sent to pharmacy.  OK to close encounter.

## 2020-02-20 NOTE — Progress Notes (Signed)
Rx for Valtrex 500 mg po q day.  Disp:  #30, RF:  none

## 2020-02-20 NOTE — Telephone Encounter (Signed)
Spoke with patient and she states she only has 4 more Valtrex tablets left and is requesting a refill prior to her AEX appointment.  Will route to provider

## 2020-03-05 ENCOUNTER — Other Ambulatory Visit: Payer: Self-pay

## 2020-03-05 ENCOUNTER — Ambulatory Visit (INDEPENDENT_AMBULATORY_CARE_PROVIDER_SITE_OTHER): Payer: BC Managed Care – PPO | Admitting: Obstetrics and Gynecology

## 2020-03-05 ENCOUNTER — Ambulatory Visit (INDEPENDENT_AMBULATORY_CARE_PROVIDER_SITE_OTHER): Payer: BC Managed Care – PPO

## 2020-03-05 ENCOUNTER — Encounter: Payer: Self-pay | Admitting: Obstetrics and Gynecology

## 2020-03-05 VITALS — BP 104/66 | HR 60 | Ht 64.5 in | Wt 130.0 lb

## 2020-03-05 DIAGNOSIS — N921 Excessive and frequent menstruation with irregular cycle: Secondary | ICD-10-CM

## 2020-03-05 DIAGNOSIS — N83202 Unspecified ovarian cyst, left side: Secondary | ICD-10-CM

## 2020-03-05 DIAGNOSIS — Z8742 Personal history of other diseases of the female genital tract: Secondary | ICD-10-CM | POA: Diagnosis not present

## 2020-03-05 NOTE — Progress Notes (Signed)
GYNECOLOGY  VISIT   HPI: 39 y.o.   Divorced  Caucasian  female   G1P1001 with Patient's last menstrual period was 03/04/2020 (exact date).   here for pelvic ultrasound for a recheck of a left ovarian cyst 62 x 47 x 47 mm noted on [place] 01/16/20.  She was seen earlier that day for abnormal bleeding, cramping, and was noted to have a mas on pelvic exam.   Having twinges in the pelvis here and there.   Bleeding finally stopped 02/18/20. It had started on 12/24/19.   Has started her menses today, and it is really heavy.  Bled through her clothes.   She did start her birth control pills, Yasmin, on 01/16/20, when her cyst was diagnosed. She has been on these for 2 months.  She noted decreased libido and feeling more irritable.   Marrying May of next year.  Not sure if she will be pursuing pregnancy.   GYNECOLOGIC HISTORY: Patient's last menstrual period was 03/04/2020 (exact date). Contraception: Tubal/Yasmin Menopausal hormone therapy:  none Last mammogram: n/a Last pap smear: 03-06-19 ASCUS:Neg HR HPV,01/26/18 ASCUS:Pos HR HPV;03/09/18 Colposcopic biopsies were normal        OB History    Gravida  1   Para  1   Term  1   Preterm      AB      Living  1     SAB      TAB      Ectopic      Multiple      Live Births                 Patient Active Problem List   Diagnosis Date Noted  . Allergic rhinitis due to allergen 11/05/2019  . Abnormal uterine bleeding 10/26/2018    Past Medical History:  Diagnosis Date  . Abnormal uterine bleeding (AUB)   . Acute hearing loss    Right  . Dizziness   . Elevated AST (SGOT) 10/2016  . Endometriosis   . Left ovarian cyst   . Low ferritin   . Migraines    without aura  . STD (sexually transmitted disease)    HX HSV  . Tinnitus, right ear     Past Surgical History:  Procedure Laterality Date  . ABLATION ON ENDOMETRIOSIS N/A 07/21/2015   Procedure: FULGERATION OF ENDOMETRIOSIS, BIOPSY OF POSTERIOR CUL DE SAC;  Surgeon:  Patton Salles, MD;  Location: [facility];  Service: Gynecology;  Laterality: N/A;  . APPENDECTOMY  2004  . AUGMENTATION MAMMAPLASTY  2011  . CESAREAN SECTION  2003  . CHOLECYSTECTOMY  2004  . LAPAROSCOPIC TUBAL LIGATION Right 07/21/2015   Procedure: LAPAROSCOPIC TUBAL LIGATION;  Surgeon: Patton Salles, MD;  Location: [facility];  Service: Gynecology;  Laterality: Right;  . OVARIAN CYST REMOVAL Left 07/21/2015   Procedure: EXCISION PARA-OVARIAN CYST;  Surgeon: Patton Salles, MD;  Location: [facility];  Service: Gynecology;  Laterality: Left;  . UNILATERAL SALPINGECTOMY Left 07/21/2015   Procedure: DISTAL UNILATERAL SALPINGECTOMY;  Surgeon: Patton Salles, MD;  Location: [facility];  Service: Gynecology;  Laterality: Left;    Current Outpatient Medications  Medication Sig Dispense Refill  . Ascorbic Acid (VITAMIN C) 1000 MG tablet Take 1,000 mg by mouth daily.    . Cholecalciferol (VITAMIN D-3 PO) Take 1 tablet by mouth daily.    . drospirenone-ethinyl estradiol (YASMIN) 3-0.03 MG tablet Take 1 tablet by mouth daily.  3 Package 0  . ELDERBERRY PO Take 1 tablet by mouth daily.    . fluticasone (FLONASE) 50 MCG/ACT nasal spray Place 1 spray into both nostrils as needed.    Marland Kitchen ibuprofen (ADVIL,MOTRIN) 200 MG tablet Take 600-800 mg by mouth every 8 (eight) hours as needed.     . loratadine (CLARITIN) 10 MG tablet Take 10 mg by mouth daily.    . Multiple Vitamins-Minerals (MULTI ADULT GUMMIES) CHEW     . SUMAtriptan (IMITREX) 100 MG tablet Take by mouth as needed.    . valACYclovir (VALTREX) 500 MG tablet Take one tablet by mouth daily. 30 tablet 0  . zinc gluconate 50 MG tablet Take 50 mg by mouth daily.     No current facility-administered medications for this visit.     ALLERGIES: Patient has no known allergies.  Family History  Problem Relation Age of Onset  . Colon polyps Mother        multiple pre-cancerous polyps on several colonoscopies  . Diabetes Father    . Hypertension Father   . Hyperlipidemia Father   . Diabetes Paternal Grandmother   . Hyperlipidemia Paternal Grandmother   . Hypertension Paternal Grandmother     Social History   Socioeconomic History  . Marital status: Divorced    Spouse name: Not on file  . Number of children: Not on file  . Years of education: Not on file  . Highest education level: Not on file  Occupational History  . Not on file  Tobacco Use  . Smoking status: Light Tobacco Smoker    Types: Cigarettes  . Smokeless tobacco: Never Used  Vaping Use  . Vaping Use: Never used  Substance and Sexual Activity  . Alcohol use: Yes    Alcohol/week: 2.0 standard drinks    Types: 2 Standard drinks or equivalent per week    Comment: 1-2 beers per week--only socially  . Drug use: No  . Sexual activity: Yes    Partners: Male    Birth control/protection: Surgical    Comment: Tubal  Other Topics Concern  . Not on file  Social History Narrative  . Not on file   Social Determinants of Health   Financial Resource Strain:   . Difficulty of Paying Living Expenses:   Food Insecurity:   . Worried About Programme researcher, broadcasting/film/video in the Last Year:   . Barista in the Last Year:   Transportation Needs:   . Freight forwarder (Medical):   Marland Kitchen Lack of Transportation (Non-Medical):   Physical Activity:   . Days of Exercise per Week:   . Minutes of Exercise per Session:   Stress:   . Feeling of Stress :   Social Connections:   . Frequency of Communication with Friends and Family:   . Frequency of Social Gatherings with Friends and Family:   . Attends Religious Services:   . Active Member of Clubs or Organizations:   . Attends Banker Meetings:   Marland Kitchen Marital Status:   Intimate Partner Violence:   . Fear of Current or Ex-Partner:   . Emotionally Abused:   Marland Kitchen Physically Abused:   . Sexually Abused:     Review of Systems  All other systems reviewed and are negative.   PHYSICAL EXAMINATION:     BP 104/66   Pulse 60   Ht 5' 4.5" (1.638 m)   Wt 130 lb (59 kg)   LMP 03/04/2020 (Exact Date)   BMI 21.97 kg/m  General appearance: alert, cooperative and appears stated age  Pelvic US Uterus no masses.  EMS 4.74 mm.  Ovaries normal.  Left ovarian cyst resolved. No adnexal masses. No free fluid.  ASSESSMENT  History of left ovarian cyst.  Menorrhagia with irregular bleeding.  Irregular bleeding is hopefully resolved now. Low ferritin.   PLAN  Ultrasound images and report reviewed with patient.  Reassurance regarding resolution of ovarian cyst.  Ok to do continuous Yasmin and withdraw every 9 weeks.  We discussed potential IVF if she decides to pursue future fertility. Return for annual exam and blood work next week.     26 minute consultation.

## 2020-03-10 NOTE — Progress Notes (Signed)
39 y.o. G48P1001 Divorced Caucasian female here for annual exam.    Has been using combined oral contraception for cycle regulation.  On chart review now, I see that she is a light smoker.  She bled last week with a heavy cycle and has now stopped.   Takes Zyrtec and Claritin for allergies.  She does Kenolog injections.   Has not received Covid vaccination.  Not feeling comfortable with it.  Her son had Covid.   PCP: Marianne Sofia, PA-C  Patient's last menstrual period was 03/04/2020 (exact date).     Period Pattern: (!) Irregular Menstrual Flow: Heavy Menstrual Control: Tampon Menstrual Control Change Freq (Hours): every 30 minutes to 1 hour on heaviest day Dysmenorrhea: (!) Severe Dysmenorrhea Symptoms: Cramping, Headache     Sexually active: Yes.    The current method of family planning is tubal ligation.  Yasmin.   Withdraws every 9 weeks. Exercising: Yes.    Cardio and light weights.  Smoker:  Smokes 5 cigs/week  Health Maintenance: Pap: 03-06-19 ASCUS:Neg HR HPV, 01/26/18 ASCUS:Pos HR HPV, 10-16-15 Neg:Neg HR HPV History of abnormal Pap:  Yes, 03-06-19 ASCUS:Neg HR HPV--colpo showed Neg ECC, 01/26/18 ASCUS:Pos HR HPV--03-09-18 colpo biopsies were normal MMG:  n/a Colonoscopy:  n/a BMD:   n/a  Result  n/a TDaP:  10-10-14 Gardasil:   no HIV:03-09-18 NR Hep C:03-09-18 Neg Screening Labs:  Today.   reports that she has been smoking cigarettes. She has never used smokeless tobacco. She reports current alcohol use of about 2.0 standard drinks of alcohol per week. She reports that she does not use drugs.  Past Medical History:  Diagnosis Date  . Abnormal uterine bleeding (AUB)   . Acute hearing loss    Right  . Dizziness   . Elevated AST (SGOT) 10/2016  . Endometriosis   . Left ovarian cyst   . Low ferritin   . Migraines    without aura  . STD (sexually transmitted disease)    HX HSV  . Tinnitus, right ear     Past Surgical History:  Procedure Laterality Date  .  ABLATION ON ENDOMETRIOSIS N/A 07/21/2015   Procedure: FULGERATION OF ENDOMETRIOSIS, BIOPSY OF POSTERIOR CUL DE SAC;  Surgeon: Patton Salles, MD;  Location: [facility];  Service: Gynecology;  Laterality: N/A;  . APPENDECTOMY  2004  . AUGMENTATION MAMMAPLASTY  2011  . CESAREAN SECTION  2003  . CHOLECYSTECTOMY  2004  . LAPAROSCOPIC TUBAL LIGATION Right 07/21/2015   Procedure: LAPAROSCOPIC TUBAL LIGATION;  Surgeon: Patton Salles, MD;  Location: [facility];  Service: Gynecology;  Laterality: Right;  . OVARIAN CYST REMOVAL Left 07/21/2015   Procedure: EXCISION PARA-OVARIAN CYST;  Surgeon: Patton Salles, MD;  Location: [facility];  Service: Gynecology;  Laterality: Left;  . UNILATERAL SALPINGECTOMY Left 07/21/2015   Procedure: DISTAL UNILATERAL SALPINGECTOMY;  Surgeon: Patton Salles, MD;  Location: [facility];  Service: Gynecology;  Laterality: Left;    Current Outpatient Medications  Medication Sig Dispense Refill  . Ascorbic Acid (VITAMIN C) 1000 MG tablet Take 1,000 mg by mouth daily.    . Cholecalciferol (VITAMIN D-3 PO) Take 1 tablet by mouth daily.    Marland Kitchen ELDERBERRY PO Take 1 tablet by mouth daily.    . fluticasone (FLONASE) 50 MCG/ACT nasal spray Place 1 spray into both nostrils as needed.    Marland Kitchen ibuprofen (ADVIL,MOTRIN) 200 MG tablet Take 600-800 mg by mouth every 8 (eight) hours as needed.     Marland Kitchen  loratadine (CLARITIN) 10 MG tablet Take 10 mg by mouth daily.    . Multiple Vitamins-Minerals (MULTI ADULT GUMMIES) CHEW     . SUMAtriptan (IMITREX) 100 MG tablet Take by mouth as needed.    . valACYclovir (VALTREX) 500 MG tablet Take one tablet by mouth daily. 100 tablet 3  . zinc gluconate 50 MG tablet Take 50 mg by mouth daily.    . norethindrone (MICRONOR) 0.35 MG tablet Take 1 tablet (0.35 mg total) by mouth daily. 84 tablet 3   No current facility-administered medications for this visit.    Family History  Problem Relation Age of Onset  . Colon polyps Mother         multiple pre-cancerous polyps on several colonoscopies  . Diabetes Father   . Hypertension Father   . Hyperlipidemia Father   . Diabetes Paternal Grandmother   . Hyperlipidemia Paternal Grandmother   . Hypertension Paternal Grandmother     Review of Systems  All other systems reviewed and are negative.   Exam:   BP (!) 100/62   Pulse 60   Resp 14   Ht 5' 4.5" (1.638 m)   Wt 130 lb 9.6 oz (59.2 kg)   LMP 03/04/2020 (Exact Date)   BMI 22.07 kg/m     General appearance: alert, cooperative and appears stated age Head: normocephalic, without obvious abnormality, atraumatic Neck: no adenopathy, supple, symmetrical, trachea midline and thyroid normal to inspection and palpation Lungs: clear to auscultation bilaterally Breasts: bilateral implants, no masses or tenderness, No nipple retraction or dimpling, No nipple discharge or bleeding, No axillary adenopathy Heart: regular rate and rhythm Abdomen: soft, non-tender; no masses, no organomegaly Extremities: extremities normal, atraumatic, no cyanosis or edema Skin: skin color, texture, turgor normal. No rashes or lesions Lymph nodes: cervical, supraclavicular, and axillary nodes normal. Neurologic: grossly normal  Pelvic: External genitalia:  no lesions              No abnormal inguinal nodes palpated.              Urethra:  normal appearing urethra with no masses, tenderness or lesions              Bartholins and Skenes: normal                 Vagina: normal appearing vagina with normal color and discharge, no lesions              Cervix: no lesions              Pap taken: Yes.   Bimanual Exam:  Uterus:  normal size, contour, position, consistency, mobility, non-tender              Adnexa: no mass, fullness, tenderness             Chaperone was present for exam.  Assessment:   Well woman visit with normal exam. Status post BTL.  Smoker over age of 62.  Hx irregular bleeding recently started on Yasmin.  Breast augmentation.   Hx HSV.   Plan: Mammogram screening discussed. Self breast awareness reviewed. Pap and HR HPV as above. Guidelines for Calcium, Vitamin D, regular exercise program including cardiovascular and weight bearing exercise. Stop Yasmin and start Micronor.  Instructed in use.  We discussed smoking cessation and the need to be smoke free for 3 months before begin able to take combined oral contraception. She will call if her irregular bleeding persists.  If so, she may need a  dilation and curettage.  Refill of Valtrex.  Routine labs today including iron and ferritin.   We discussed Covid vaccination.  Follow up annually and prn.   After visit summary provided.

## 2020-03-11 ENCOUNTER — Other Ambulatory Visit (HOSPITAL_COMMUNITY)
Admission: RE | Admit: 2020-03-11 | Discharge: 2020-03-11 | Disposition: A | Discharge status: Home / Self Care | Payer: BC Managed Care – PPO | Source: Ambulatory Visit | Attending: Obstetrics and Gynecology | Admitting: Obstetrics and Gynecology

## 2020-03-11 ENCOUNTER — Encounter: Payer: Self-pay | Admitting: Obstetrics and Gynecology

## 2020-03-11 ENCOUNTER — Other Ambulatory Visit: Payer: Self-pay

## 2020-03-11 ENCOUNTER — Ambulatory Visit (INDEPENDENT_AMBULATORY_CARE_PROVIDER_SITE_OTHER): Payer: BC Managed Care – PPO | Admitting: Obstetrics and Gynecology

## 2020-03-11 VITALS — BP 100/62 | HR 60 | Resp 14 | Ht 64.5 in | Wt 130.6 lb

## 2020-03-11 DIAGNOSIS — Z01419 Encounter for gynecological examination (general) (routine) without abnormal findings: Secondary | ICD-10-CM

## 2020-03-11 DIAGNOSIS — R87610 Atypical squamous cells of undetermined significance on cytologic smear of cervix (ASC-US): Secondary | ICD-10-CM | POA: Diagnosis not present

## 2020-03-11 MED ORDER — VALACYCLOVIR HCL 500 MG PO TABS: ORAL_TABLET | ORAL | 3 refills | Status: DC

## 2020-03-11 MED ORDER — NORETHINDRONE 0.35 MG PO TABS
1.0000 | ORAL_TABLET | Freq: Every day | ORAL | 3 refills | Status: DC
Start: 2020-03-11 — End: 2021-03-15

## 2020-03-11 NOTE — Patient Instructions (Signed)

## 2020-03-12 ENCOUNTER — Encounter: Payer: Self-pay | Admitting: Physician Assistant

## 2020-03-12 LAB — CBC
Hematocrit: 37.5 % (ref 34.0–46.6)
Hemoglobin: 12.2 g/dL (ref 11.1–15.9)
MCH: 32.3 pg (ref 26.6–33.0)
MCHC: 32.5 g/dL (ref 31.5–35.7)
MCV: 99 fL — ABNORMAL HIGH (ref 79–97)
Platelets: 366 10*3/uL (ref 150–450)
RBC: 3.78 x10E6/uL (ref 3.77–5.28)
RDW: 11.5 % — ABNORMAL LOW (ref 11.7–15.4)
WBC: 6.7 10*3/uL (ref 3.4–10.8)

## 2020-03-12 LAB — TSH: TSH: 1.39 u[IU]/mL (ref 0.450–4.500)

## 2020-03-12 LAB — IRON: Iron: 87 ug/dL (ref 27–159)

## 2020-03-12 LAB — COMPREHENSIVE METABOLIC PANEL
ALT: 12 IU/L (ref 0–32)
AST: 17 IU/L (ref 0–40)
Albumin/Globulin Ratio: 1.6 (ref 1.2–2.2)
Albumin: 4.2 g/dL (ref 3.8–4.8)
Alkaline Phosphatase: 38 IU/L — ABNORMAL LOW (ref 48–121)
BUN/Creatinine Ratio: 17 (ref 9–23)
BUN: 13 mg/dL (ref 6–20)
Bilirubin Total: 0.2 mg/dL (ref 0.0–1.2)
CO2: 23 mmol/L (ref 20–29)
Calcium: 9.4 mg/dL (ref 8.7–10.2)
Chloride: 103 mmol/L (ref 96–106)
Creatinine, Ser: 0.78 mg/dL (ref 0.57–1.00)
GFR calc Af Amer: 113 mL/min/{1.73_m2} (ref >59)
GFR calc non Af Amer: 98 mL/min/{1.73_m2} (ref >59)
Globulin, Total: 2.7 g/dL (ref 1.5–4.5)
Glucose: 82 mg/dL (ref 65–99)
Potassium: 4 mmol/L (ref 3.5–5.2)
Sodium: 139 mmol/L (ref 134–144)
Total Protein: 6.9 g/dL (ref 6.0–8.5)

## 2020-03-12 LAB — CYTOLOGY - PAP
Comment: NEGATIVE
Diagnosis: UNDETERMINED — AB
High risk HPV: NEGATIVE

## 2020-03-12 LAB — LIPID PANEL
Chol/HDL Ratio: 2.9 ratio (ref 0.0–4.4)
Cholesterol, Total: 167 mg/dL (ref 100–199)
HDL: 57 mg/dL (ref >39)
LDL Chol Calc (NIH): 100 mg/dL — ABNORMAL HIGH (ref 0–99)
Triglycerides: 51 mg/dL (ref 0–149)
VLDL Cholesterol Cal: 10 mg/dL (ref 5–40)

## 2020-03-12 LAB — FERRITIN: Ferritin: 17 ng/mL (ref 15–150)

## 2020-04-01 ENCOUNTER — Other Ambulatory Visit: Payer: Self-pay | Admitting: Obstetrics and Gynecology

## 2020-04-22 ENCOUNTER — Ambulatory Visit: Payer: BC Managed Care – PPO | Admitting: Physician Assistant

## 2020-04-22 ENCOUNTER — Other Ambulatory Visit: Payer: Self-pay

## 2020-04-22 ENCOUNTER — Encounter: Payer: Self-pay | Admitting: Physician Assistant

## 2020-04-22 VITALS — BP 110/68 | HR 72 | Temp 97.7°F | Ht 64.5 in | Wt 129.0 lb

## 2020-04-22 DIAGNOSIS — J3089 Other allergic rhinitis: Secondary | ICD-10-CM

## 2020-04-22 MED ORDER — PREDNISONE 10 MG PO TABS: ORAL_TABLET | ORAL | 0 refills | Status: DC

## 2020-04-22 NOTE — Progress Notes (Signed)
Acute Office Visit  Subjective:    Patient ID: Ann Whitehead, female    DOB: 1981-07-15, 39 y.o.   MRN: [id number]  Chief Complaint  Patient presents with  . Allergies    HPI Patient is in today for complaints of allergies Pt is currently taking claritin and flonase for her allergy symptoms - she has runny nose, stopped up , and watery eyes - she usually gets kenalog injection every season which helps controls her symptoms however she is scheduled to get second COVID vaccine and don't advise getting kenalog shot today - will postpone Denies fever, cough, malaise - is tested weekly for COVID at work and is negative  Past Medical History:  Diagnosis Date  . Abnormal uterine bleeding (AUB)   . Acute hearing loss    Right  . Dizziness   . Elevated AST (SGOT) 10/2016  . Endometriosis   . Left ovarian cyst   . Low ferritin   . Migraines    without aura  . STD (sexually transmitted disease)    HX HSV  . Tinnitus, right ear     Past Surgical History:  Procedure Laterality Date  . ABLATION ON ENDOMETRIOSIS N/A 07/21/2015   Procedure: FULGERATION OF ENDOMETRIOSIS, BIOPSY OF POSTERIOR CUL DE SAC;  Surgeon: Patton Salles, MD;  Location: [facility];  Service: Gynecology;  Laterality: N/A;  . APPENDECTOMY  2004  . AUGMENTATION MAMMAPLASTY  2011  . CESAREAN SECTION  2003  . CHOLECYSTECTOMY  2004  . LAPAROSCOPIC TUBAL LIGATION Right 07/21/2015   Procedure: LAPAROSCOPIC TUBAL LIGATION;  Surgeon: Patton Salles, MD;  Location: [facility];  Service: Gynecology;  Laterality: Right;  . OVARIAN CYST REMOVAL Left 07/21/2015   Procedure: EXCISION PARA-OVARIAN CYST;  Surgeon: Patton Salles, MD;  Location: [facility];  Service: Gynecology;  Laterality: Left;  . UNILATERAL SALPINGECTOMY Left 07/21/2015   Procedure: DISTAL UNILATERAL SALPINGECTOMY;  Surgeon: Patton Salles, MD;  Location: [facility];  Service: Gynecology;  Laterality: Left;    Family History  Problem  Relation Age of Onset  . Colon polyps Mother        multiple pre-cancerous polyps on several colonoscopies  . Diabetes Father   . Hypertension Father   . Hyperlipidemia Father   . Diabetes Paternal Grandmother   . Hyperlipidemia Paternal Grandmother   . Hypertension Paternal Grandmother     Social History   Socioeconomic History  . Marital status: Divorced    Spouse name: Not on file  . Number of children: Not on file  . Years of education: Not on file  . Highest education level: Not on file  Occupational History  . Not on file  Tobacco Use  . Smoking status: Light Tobacco Smoker    Types: Cigarettes  . Smokeless tobacco: Never Used  . Tobacco comment: smokes 5 cigs/week  Vaping Use  . Vaping Use: Never used  Substance and Sexual Activity  . Alcohol use: Yes    Alcohol/week: 2.0 standard drinks    Types: 2 Standard drinks or equivalent per week    Comment: 1-2 beers per week--only socially  . Drug use: No  . Sexual activity: Yes    Partners: Male    Birth control/protection: Surgical    Comment: Tubal  Other Topics Concern  . Not on file  Social History Narrative  . Not on file   Social Determinants of Health   Financial Resource Strain:   . Difficulty  of Paying Living Expenses: Not on file  Food Insecurity:   . Worried About Programme researcher, broadcasting/film/video in the Last Year: Not on file  . Ran Out of Food in the Last Year: Not on file  Transportation Needs:   . Lack of Transportation (Medical): Not on file  . Lack of Transportation (Non-Medical): Not on file  Physical Activity:   . Days of Exercise per Week: Not on file  . Minutes of Exercise per Session: Not on file  Stress:   . Feeling of Stress : Not on file  Social Connections:   . Frequency of Communication with Friends and Family: Not on file  . Frequency of Social Gatherings with Friends and Family: Not on file  . Attends Religious Services: Not on file  . Active Member of Clubs or Organizations: Not on file    . Attends Banker Meetings: Not on file  . Marital Status: Not on file  Intimate Partner Violence:   . Fear of Current or Ex-Partner: Not on file  . Emotionally Abused: Not on file  . Physically Abused: Not on file  . Sexually Abused: Not on file     Current Outpatient Medications:  .  Ascorbic Acid (VITAMIN C) 1000 MG tablet, Take 1,000 mg by mouth daily., Disp: , Rfl:  .  cetirizine (ZYRTEC) 10 MG tablet, Take 10 mg by mouth daily., Disp: , Rfl:  .  Cholecalciferol (VITAMIN D-3 PO), Take 1 tablet by mouth daily., Disp: , Rfl:  .  ELDERBERRY PO, Take 1 tablet by mouth daily., Disp: , Rfl:  .  fluticasone (FLONASE) 50 MCG/ACT nasal spray, Place 1 spray into both nostrils as needed., Disp: , Rfl:  .  ibuprofen (ADVIL,MOTRIN) 200 MG tablet, Take 600-800 mg by mouth every 8 (eight) hours as needed. , Disp: , Rfl:  .  loratadine (CLARITIN) 10 MG tablet, Take 10 mg by mouth daily., Disp: , Rfl:  .  Multiple Vitamins-Minerals (MULTI ADULT GUMMIES) CHEW, , Disp: , Rfl:  .  norethindrone (MICRONOR) 0.35 MG tablet, Take 1 tablet (0.35 mg total) by mouth daily., Disp: 84 tablet, Rfl: 3 .  SUMAtriptan (IMITREX) 100 MG tablet, Take by mouth as needed., Disp: , Rfl:  .  valACYclovir (VALTREX) 500 MG tablet, Take one tablet by mouth daily., Disp: 100 tablet, Rfl: 3 .  zinc gluconate 50 MG tablet, Take 50 mg by mouth daily., Disp: , Rfl:  .  predniSONE (DELTASONE) 10 MG tablet, 1 po tid for 3 days then 1 po bid for 3 days then 1 po qd for 3 days, Disp: 18 tablet, Rfl: 0   No Known Allergies  CONSTITUTIONAL: Negative for chills, fatigue, fever, unintentional weight gain and unintentional weight loss.  E/N/T: see HPI CARDIOVASCULAR: Negative for chest pain, dizziness, palpitations and pedal edema.  RESPIRATORY: Negative for recent cough and dyspnea.       Objective:    PHYSICAL EXAM:   VS: BP 110/68 (BP Location: Left Arm, Patient Position: Sitting)   Pulse 72   Temp 97.7 F  (36.5 C) (Temporal)   Ht 5' 4.5" (1.638 m)   Wt 129 lb (58.5 kg)   SpO2 98%   BMI 21.80 kg/m   GEN: Well nourished, well developed, in no acute distress  HEENT: nasal muscosa normal Oropharynx - normal mucosa, palate, and posterior pharynx  Cardiac: RRR; no murmurs, rubs, or gallops,no edema - no significant varicosities Respiratory:  normal respiratory rate and pattern with no distress -  normal breath sounds with no rales, rhonchi, wheezes or rubs  Psych: euthymic mood, appropriate affect and demeanor   Wt Readings from Last 3 Encounters:  04/22/20 129 lb (58.5 kg)  03/11/20 130 lb 9.6 oz (59.2 kg)  03/05/20 130 lb (59 kg)    Health Maintenance Due  Topic Date Due  . INFLUENZA VACCINE  03/15/2020  . COVID-19 Vaccine (2 - Pfizer 2-dose series) 04/25/2020    There are no preventive care reminders to display for this patient.        Assessment & Plan:   Problem List Items Addressed This Visit      Respiratory   Allergic rhinitis due to allergen - Primary    rx for prednisone Continue her current meds Since pt scheduled for COVID vaccine in few days will defer kenalog shot for few weeks after that          Meds ordered this encounter  Medications  . predniSONE (DELTASONE) 10 MG tablet    Sig: 1 po tid for 3 days then 1 po bid for 3 days then 1 po qd for 3 days    Dispense:  18 tablet    Refill:  0    Order Specific Question:   Supervising Provider    Answer:   COX, Fritzi Mandes [[id number]]     SARA R Shakevia Sarris, PA-C

## 2020-04-22 NOTE — Assessment & Plan Note (Signed)
rx for prednisone Continue her current meds Since pt scheduled for COVID vaccine in few days will defer kenalog shot for few weeks after that

## 2020-04-23 ENCOUNTER — Ambulatory Visit: Payer: BC Managed Care – PPO | Admitting: Physician Assistant

## 2020-06-24 DIAGNOSIS — U071 COVID-19: Secondary | ICD-10-CM | POA: Diagnosis not present

## 2020-06-24 DIAGNOSIS — Z20822 Contact with and (suspected) exposure to covid-19: Secondary | ICD-10-CM | POA: Diagnosis not present

## 2020-06-25 ENCOUNTER — Ambulatory Visit: Payer: BC Managed Care – PPO

## 2020-06-26 ENCOUNTER — Telehealth: Payer: Self-pay | Admitting: Physician Assistant

## 2020-06-26 NOTE — Telephone Encounter (Signed)
Called to discuss with patient about Covid symptoms and the use of sotrovimab, bamlanivimab/etesevimab or casirivimab/imdevimab, a monoclonal antibody infusion for those with mild to moderate Covid symptoms and at a high risk of hospitalization.  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 My chart message sent if active on Mychart.   Daizha Anand PA-C  

## 2020-06-26 NOTE — Telephone Encounter (Addendum)
Called to discuss with Annie Ingram about Covid symptoms and the use of sotrovimab, casirivimab/imdevimab or bamlanivimab/etesevimab infusion for those with mild to moderate Covid symptoms and at a high risk of hospitalization.     Pt is qualified for this infusion at the monoclonal antibody infusion center due to co-morbid conditions and/or a member of an at-risk group (high SVI), however declines at this time. Symptoms tier reviewed as well as criteria for ending isolation.  Symptoms reviewed that would warrant ED/Hospital evaluation. Preventative practices reviewed. Patient verbalized understanding. Patient advised to call back if he decides that he does want to get infusion. Callback number to the infusion center given. Patient advised to go to Urgent care or ED with severe symptoms. Last date pt would be eligible for infusion is 11/18. Pt is fully vaccinated and insurance will not cover this medication.   Patient Active Problem List   Diagnosis Date Noted   Allergic rhinitis due to allergen 11/05/2019   Abnormal uterine bleeding 10/26/2018    Cline Crock PA-C

## 2020-07-15 ENCOUNTER — Other Ambulatory Visit: Payer: Self-pay

## 2020-07-15 ENCOUNTER — Ambulatory Visit (INDEPENDENT_AMBULATORY_CARE_PROVIDER_SITE_OTHER): Payer: BC Managed Care – PPO

## 2020-07-15 DIAGNOSIS — J3089 Other allergic rhinitis: Secondary | ICD-10-CM | POA: Diagnosis not present

## 2020-07-15 MED ORDER — TRIAMCINOLONE ACETONIDE 40 MG/ML IJ SUSP
60.0000 mg | Freq: Once | INTRAMUSCULAR | Status: AC
  Administered 2020-07-15: 60 mg via INTRAMUSCULAR

## 2020-08-08 IMAGING — US US PELVIS COMPLETE TRANSABD/TRANSVAG W DUPLEX
1 series · 13 of 25 positions shown · non-contrast
Comparison: 01/16/2020

CLINICAL DATA: Left ovarian cyst, increased pelvic pain

EXAM:
TRANSABDOMINAL AND TRANSVAGINAL ULTRASOUND OF PELVIS
DOPPLER ULTRASOUND OF OVARIES
TECHNIQUE: Both transabdominal and transvaginal ultrasound examinations of the
pelvis were performed. Transabdominal technique was performed for
global imaging of the pelvis including uterus, ovaries, adnexal
regions, and pelvic cul-de-sac.
It was necessary to proceed with endovaginal exam following the
transabdominal exam to visualize the uterus and adnexae in better
detail. Color and duplex Doppler ultrasound was utilized to evaluate
blood flow to the ovaries.

[Series 1: us pelvis complete transabd/transvag w duplex · 0.20mm/px · 138 acquisitions, 13 frames shown]
[im 1/138]
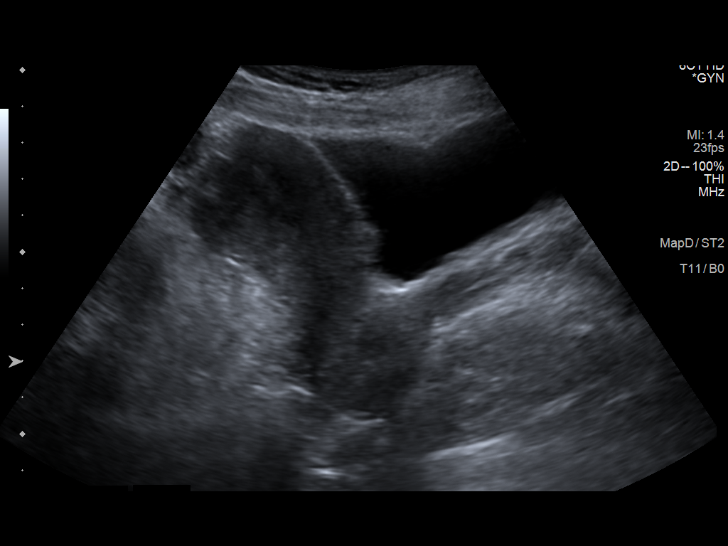
[im 12/138]
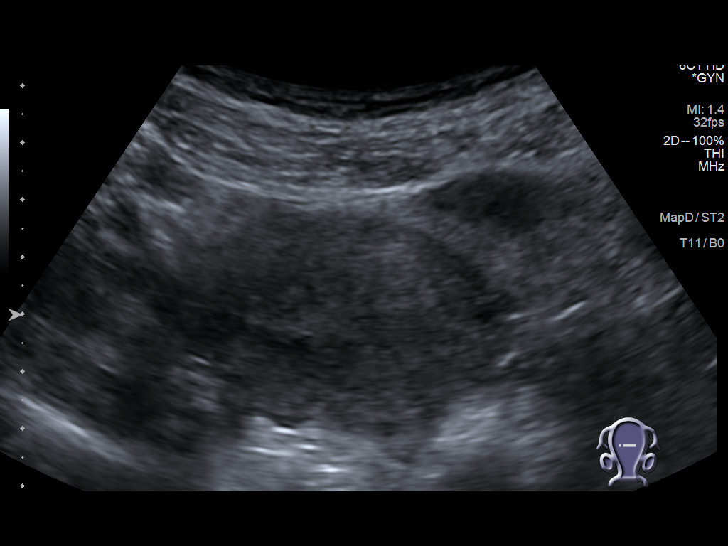
[im 23/138]
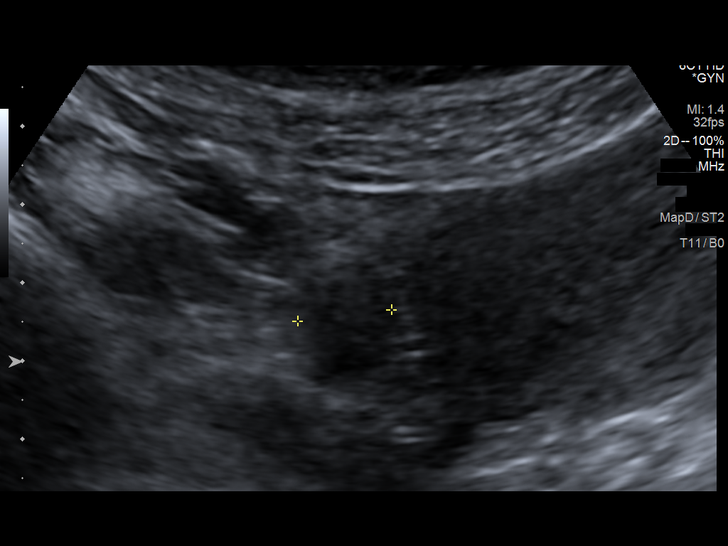
[im 35/138]
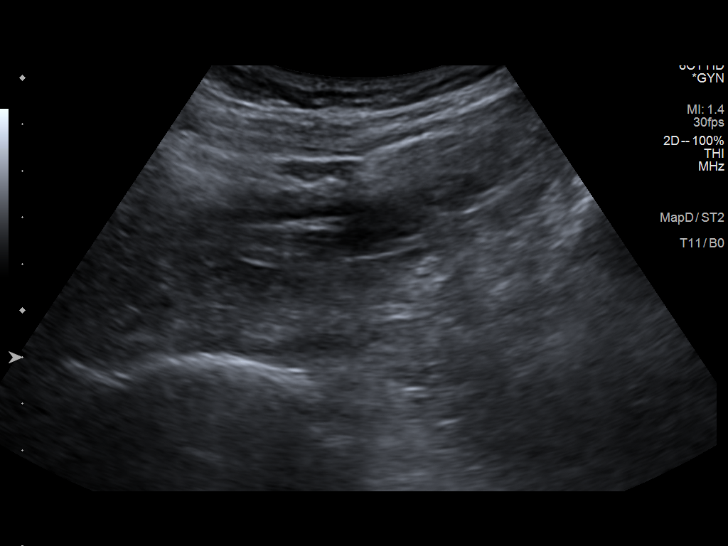
[im 46/138]
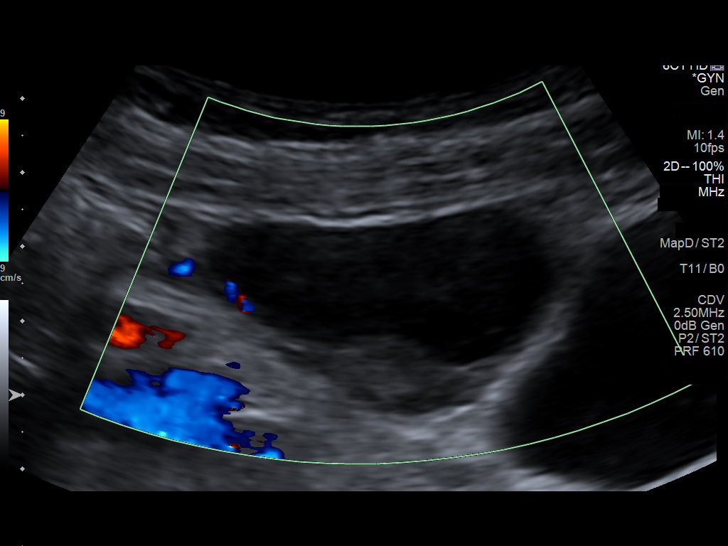
[im 58/138]
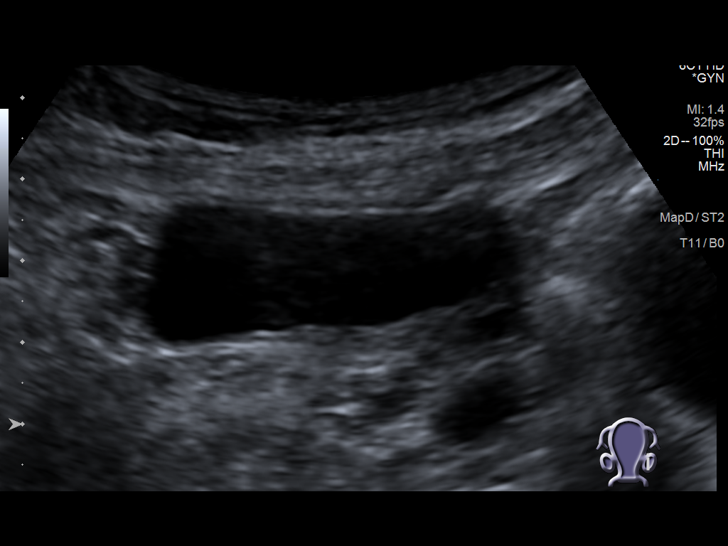
[im 69/138]
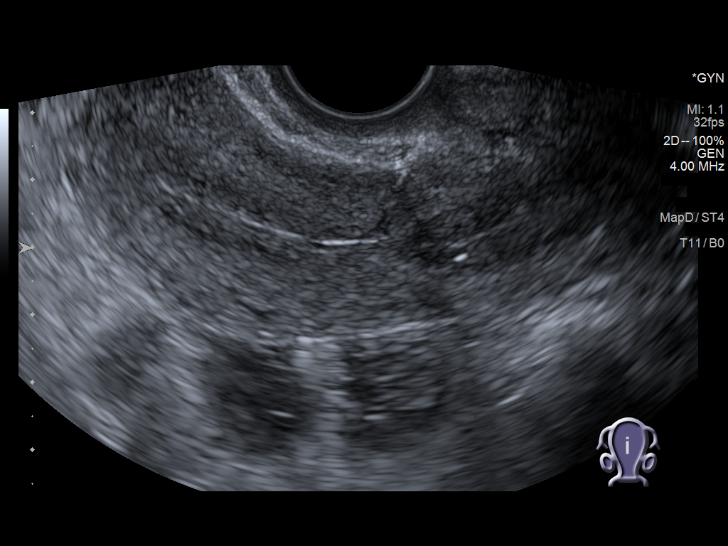
[im 80/138]
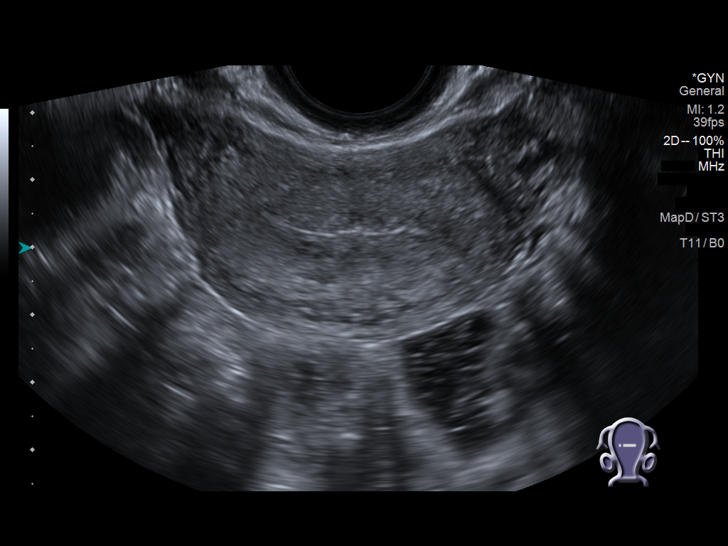
[im 92/138]
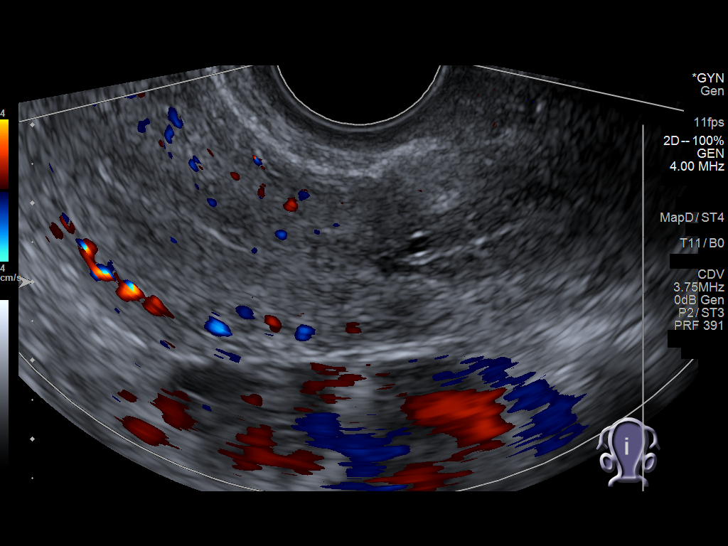
[im 103/138]
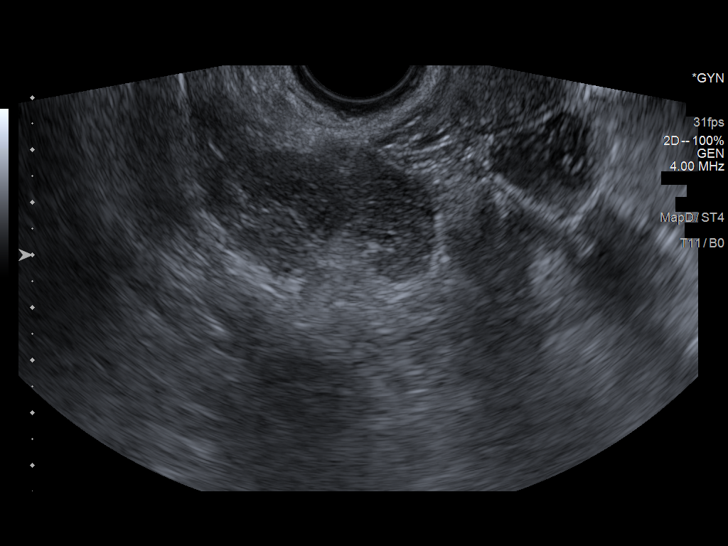
[im 115/138]
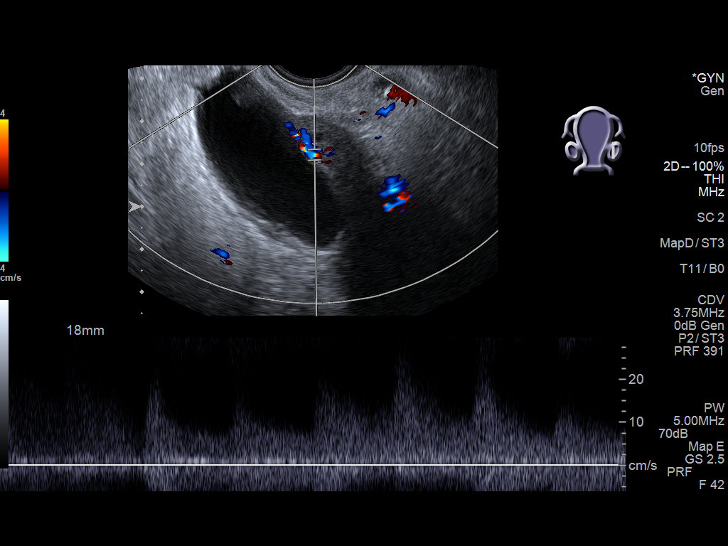
[im 126/138]
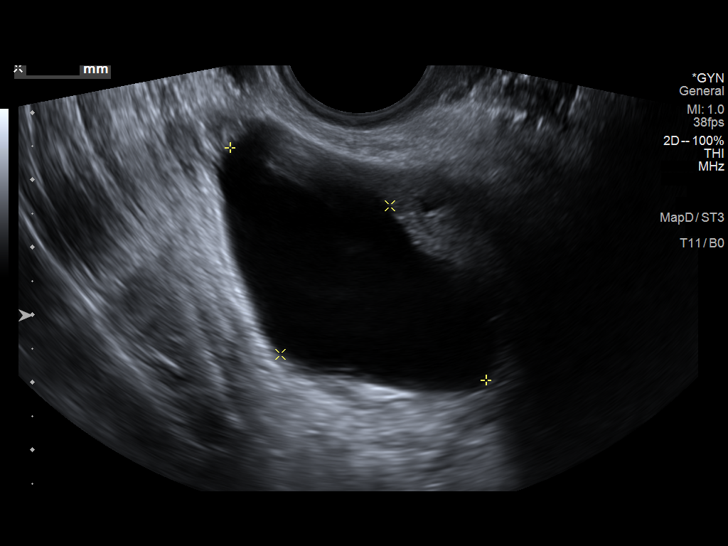
[im 138/138]
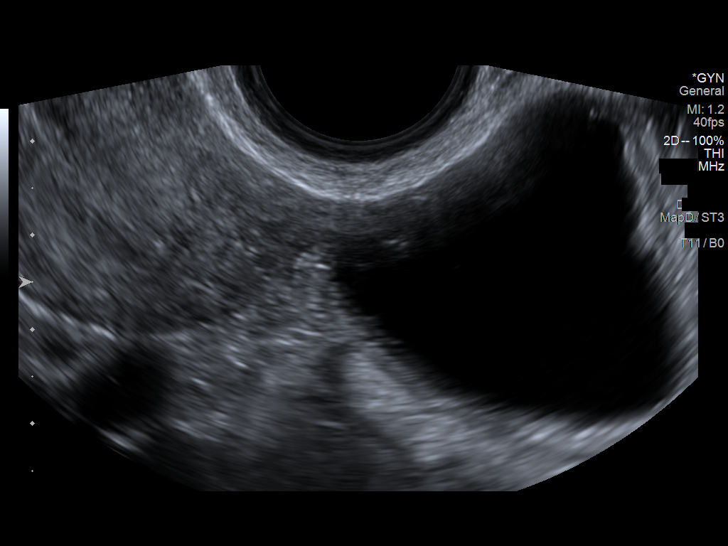

[13 of 25 positions shown; findings below may reference images not displayed]

FINDINGS: Uterus

Measurements: 8.5 x 3.4 x 6.0 cm = volume: 91 mL. No fibroids or
other mass visualized.

Endometrium

Thickness: 4 mm.  No focal abnormality visualized.

Right ovary

Measurements: 1.5 x 1.1 x 1.2 cm = volume: 1.0 mL. Only visualized
transabdominally. Limited Doppler assessment but no gross torsion
demonstrated. There is color flow with arterial and venous Doppler
by transabdominal imaging.

Left ovary

Measurements: 5.0 x 2.1 x 5.1 cm = volume: 28 mL. 5 cm minimally
complex cyst again demonstrated. Normal Doppler evaluation.

Pulsed Doppler evaluation of both ovaries demonstrates normal
low-resistance arterial and venous waveforms.

Other findings

Trace free fluid likely physiologic
IMPRESSION: No acute finding by pelvic ultrasound or definite evidence of
torsion. Stable 5 cm left ovarian cyst

Trace pelvic fluid likely physiologic

## 2020-12-07 ENCOUNTER — Other Ambulatory Visit: Payer: Self-pay

## 2020-12-07 ENCOUNTER — Encounter: Payer: Self-pay | Admitting: Physician Assistant

## 2020-12-07 ENCOUNTER — Ambulatory Visit: Payer: BC Managed Care – PPO | Admitting: Physician Assistant

## 2020-12-07 VITALS — BP 110/72 | HR 70 | Temp 97.5°F | Ht 64.0 in | Wt 129.0 lb

## 2020-12-07 DIAGNOSIS — J3089 Other allergic rhinitis: Secondary | ICD-10-CM | POA: Diagnosis not present

## 2020-12-07 MED ORDER — TRIAMCINOLONE ACETONIDE 40 MG/ML IJ SUSP
60.0000 mg | Freq: Once | INTRAMUSCULAR | Status: AC
  Administered 2020-12-07: 60 mg via INTRAMUSCULAR

## 2020-12-07 NOTE — Progress Notes (Signed)
Acute Office Visit  Subjective:    Patient ID: Ann Whitehead, female    DOB: 03/16/1981, 40 y.o.   MRN: [id number]  Chief Complaint  Patient presents with  . Allergies    HPI Patient is in today for allergies Pt has seasonal allergies and every several months needs kenalog injection to help with her symptoms of sneezing, runny nose and watery eyes She has tried zyrtec, xyzal , allegra and nasacort  Past Medical History:  Diagnosis Date  . Abnormal uterine bleeding (AUB)   . Acute hearing loss    Right  . Dizziness   . Elevated AST (SGOT) 10/2016  . Endometriosis   . Left ovarian cyst   . Low ferritin   . Migraines    without aura  . STD (sexually transmitted disease)    HX HSV  . Tinnitus, right ear     Past Surgical History:  Procedure Laterality Date  . ABLATION ON ENDOMETRIOSIS N/A 07/21/2015   Procedure: FULGERATION OF ENDOMETRIOSIS, BIOPSY OF POSTERIOR CUL DE SAC;  Surgeon: Patton Salles, MD;  Location: [facility];  Service: Gynecology;  Laterality: N/A;  . APPENDECTOMY  2004  . AUGMENTATION MAMMAPLASTY  2011  . CESAREAN SECTION  2003  . CHOLECYSTECTOMY  2004  . LAPAROSCOPIC TUBAL LIGATION Right 07/21/2015   Procedure: LAPAROSCOPIC TUBAL LIGATION;  Surgeon: Patton Salles, MD;  Location: [facility];  Service: Gynecology;  Laterality: Right;  . OVARIAN CYST REMOVAL Left 07/21/2015   Procedure: EXCISION PARA-OVARIAN CYST;  Surgeon: Patton Salles, MD;  Location: [facility];  Service: Gynecology;  Laterality: Left;  . UNILATERAL SALPINGECTOMY Left 07/21/2015   Procedure: DISTAL UNILATERAL SALPINGECTOMY;  Surgeon: Patton Salles, MD;  Location: [facility];  Service: Gynecology;  Laterality: Left;    Family History  Problem Relation Age of Onset  . Colon polyps Mother        multiple pre-cancerous polyps on several colonoscopies  . Diabetes Father   . Hypertension Father   . Hyperlipidemia Father   . Diabetes Paternal Grandmother   .  Hyperlipidemia Paternal Grandmother   . Hypertension Paternal Grandmother     Social History   Socioeconomic History  . Marital status: Divorced    Spouse name: Not on file  . Number of children: Not on file  . Years of education: Not on file  . Highest education level: Not on file  Occupational History  . Not on file  Tobacco Use  . Smoking status: Light Tobacco Smoker    Types: Cigarettes  . Smokeless tobacco: Never Used  . Tobacco comment: smokes 5 cigs/week  Vaping Use  . Vaping Use: Never used  Substance and Sexual Activity  . Alcohol use: Yes    Alcohol/week: 2.0 standard drinks    Types: 2 Standard drinks or equivalent per week    Comment: 1-2 beers per week--only socially  . Drug use: No  . Sexual activity: Yes    Partners: Male    Birth control/protection: Surgical    Comment: Tubal  Other Topics Concern  . Not on file  Social History Narrative  . Not on file   Social Determinants of Health   Financial Resource Strain: Not on file  Food Insecurity: Not on file  Transportation Needs: Not on file  Physical Activity: Not on file  Stress: Not on file  Social Connections: Not on file  Intimate Partner Violence: Not on file    Outpatient  Medications Prior to Visit  Medication Sig Dispense Refill  . Ascorbic Acid (VITAMIN C) 1000 MG tablet Take 1,000 mg by mouth daily.    . cetirizine (ZYRTEC) 10 MG tablet Take 10 mg by mouth daily.    . Cholecalciferol (VITAMIN D-3 PO) Take 1 tablet by mouth daily.    Marland Kitchen ELDERBERRY PO Take 1 tablet by mouth daily.    . fluticasone (FLONASE) 50 MCG/ACT nasal spray Place 1 spray into both nostrils as needed.    Marland Kitchen ibuprofen (ADVIL,MOTRIN) 200 MG tablet Take 600-800 mg by mouth every 8 (eight) hours as needed.     . loratadine (CLARITIN) 10 MG tablet Take 10 mg by mouth daily.    . Multiple Vitamins-Minerals (MULTI ADULT GUMMIES) CHEW     . norethindrone (MICRONOR) 0.35 MG tablet Take 1 tablet (0.35 mg total) by mouth daily. 84  tablet 3  . valACYclovir (VALTREX) 500 MG tablet Take one tablet by mouth daily. 100 tablet 3  . zinc gluconate 50 MG tablet Take 50 mg by mouth daily.    . predniSONE (DELTASONE) 10 MG tablet 1 po tid for 3 days then 1 po bid for 3 days then 1 po qd for 3 days 18 tablet 0  . SUMAtriptan (IMITREX) 100 MG tablet Take by mouth as needed.     No facility-administered medications prior to visit.    No Known Allergies  Review of Systems CONSTITUTIONAL: Negative for chills, fatigue, fever, unintentional weight gain and unintentional weight loss.  E/N/T: see HPI CARDIOVASCULAR: Negative for chest pain, dizziness, palpitations and pedal edema.  RESPIRATORY: Negative for recent cough and dyspnea.          Objective:    Physical Exam PHYSICAL EXAM:   VS: BP 110/72   Pulse 70   Temp (!) 97.5 F (36.4 C)   Ht 5\' 4"  (1.626 m)   Wt 129 lb (58.5 kg)   SpO2 99%   BMI 22.14 kg/m   GEN: Well nourished, well developed, in no acute distress  HEENT: normal external ears and nose - normal external auditory canals and TMS - - Lips, Teeth and Gums - normal  Oropharynx - normal mucosa, palate, and posterior pharynx Cardiac: RRR; no murmurs, rubs, or gallops,no edema - Respiratory:  normal respiratory rate and pattern with no distress - normal breath sounds with no rales, rhonchi, wheezes or rubs   BP 110/72   Pulse 70   Temp (!) 97.5 F (36.4 C)   Ht 5\' 4"  (1.626 m)   Wt 129 lb (58.5 kg)   SpO2 99%   BMI 22.14 kg/m  Wt Readings from Last 3 Encounters:  12/07/20 129 lb (58.5 kg)  04/22/20 129 lb (58.5 kg)  03/11/20 130 lb 9.6 oz (59.2 kg)    Health Maintenance Due  Topic Date Due  . COVID-19 Vaccine (3 - Booster for Pfizer series) 10/23/2020    There are no preventive care reminders to display for this patient.   Lab Results  Component Value Date   TSH 1.390 03/11/2020   Lab Results  Component Value Date   WBC 6.7 03/11/2020   HGB 12.2 03/11/2020   HCT 37.5 03/11/2020    MCV 99 (H) 03/11/2020   PLT 366 03/11/2020   Lab Results  Component Value Date   NA 139 03/11/2020   K 4.0 03/11/2020   CO2 23 03/11/2020   GLUCOSE 82 03/11/2020   BUN 13 03/11/2020   CREATININE 0.78 03/11/2020   BILITOT 0.2  03/11/2020   ALKPHOS 38 (L) 03/11/2020   AST 17 03/11/2020   ALT 12 03/11/2020   PROT 6.9 03/11/2020   ALBUMIN 4.2 03/11/2020   CALCIUM 9.4 03/11/2020   Lab Results  Component Value Date   CHOL 167 03/11/2020   Lab Results  Component Value Date   HDL 57 03/11/2020   Lab Results  Component Value Date   LDLCALC 100 (H) 03/11/2020   Lab Results  Component Value Date   TRIG 51 03/11/2020   Lab Results  Component Value Date   CHOLHDL 2.9 03/11/2020   No results found for: HGBA1C     Assessment & Plan:  1. Seasonal allergic rhinitis due to other allergic trigger - triamcinolone acetonide (KENALOG-40) injection 60 mg  continue other meds as directed  Meds ordered this encounter  Medications  . triamcinolone acetonide (KENALOG-40) injection 60 mg    No orders of the defined types were placed in this encounter.    Follow-up: Return if symptoms worsen or fail to improve.  An After Visit Summary was printed and given to the patient.  Jettie Pagan [facility] [phone]

## 2021-01-26 DIAGNOSIS — J029 Acute pharyngitis, unspecified: Secondary | ICD-10-CM | POA: Diagnosis not present

## 2021-01-26 DIAGNOSIS — J069 Acute upper respiratory infection, unspecified: Secondary | ICD-10-CM | POA: Diagnosis not present

## 2021-01-26 DIAGNOSIS — N920 Excessive and frequent menstruation with regular cycle: Secondary | ICD-10-CM | POA: Diagnosis not present

## 2021-03-01 ENCOUNTER — Telehealth: Payer: Self-pay | Admitting: *Deleted

## 2021-03-01 MED ORDER — VALACYCLOVIR HCL 500 MG PO TABS: ORAL_TABLET | ORAL | 0 refills | Status: DC

## 2021-03-01 NOTE — Telephone Encounter (Signed)
Patient called requesting refill on Valtrex 500 mg tablet. Annual exam scheduled on 03/15/21.

## 2021-03-08 NOTE — Progress Notes (Signed)
40 y.o. G51P1001 Divorced Caucasian female here for annual exam.    Menses in June lasted one month.  Did miss some pills.  Takes Micronor for cycle regulation.   Cycles can last up to 10 days.   Hx low ferritin.  Prefers dietary supplementation and not iron supplementation to avoid nausea.  Married in May.   Noting some perineal fissures following intercourse. Otherwise no perineal lesions.   PCP:  Marianne Sofia, PA-C  Patient's last menstrual period was 02/26/2021 (approximate).     Period Cycle (Days): 30 Period Pattern: Regular Menstrual Flow: Heavy (with clots) Menstrual Control: Tampon, Maxi pad Dysmenorrhea: (!) Severe Dysmenorrhea Symptoms: Cramping, Headache     Sexually active: Yes.    The current method of family planning is tubal ligation, Micronor. Exercising: Yes.     Running and weights Smoker:  yes, smokes 1 cig/day  Health Maintenance: Pap:  03-11-20 ASCUS:Neg HR HPV, 03-06-19 ASCUS:Neg HR HPV, 01/26/18 ASCUS:Pos HR HPV History of abnormal Pap:  yes, 03-06-19 ASCUS:Neg HR HPV--colpo showed Neg ECC,  01/26/18 ASCUS:Pos HR HPV--03-09-18 colpo biopsies were normal MMG:  n/a Colonoscopy:  n/a BMD:   n/a  Result  n/a TDaP:  10-10-14 Gardasil:   no HIV:03-09-18 NR Hep C:03-09-18 Neg Screening Labs:  today.   reports that she has been smoking cigarettes. She has never used smokeless tobacco. She reports previous alcohol use. She reports that she does not use drugs.  Past Medical History:  Diagnosis Date   Abnormal uterine bleeding (AUB)    Acute hearing loss    Right   Dizziness    Elevated AST (SGOT) 10/2016   Endometriosis    Left ovarian cyst    Low ferritin    Migraines    without aura   STD (sexually transmitted disease)    HX HSV   Tinnitus, right ear     Past Surgical History:  Procedure Laterality Date   ABLATION ON ENDOMETRIOSIS N/A 07/21/2015   Procedure: FULGERATION OF ENDOMETRIOSIS, BIOPSY OF POSTERIOR CUL DE SAC;  Surgeon: Patton Salles, MD;  Location: [facility];  Service: Gynecology;  Laterality: N/A;   APPENDECTOMY  2004   AUGMENTATION MAMMAPLASTY  2011   CESAREAN SECTION  2003   CHOLECYSTECTOMY  2004   LAPAROSCOPIC TUBAL LIGATION Right 07/21/2015   Procedure: LAPAROSCOPIC TUBAL LIGATION;  Surgeon: Patton Salles, MD;  Location: [facility];  Service: Gynecology;  Laterality: Right;   OVARIAN CYST REMOVAL Left 07/21/2015   Procedure: EXCISION PARA-OVARIAN CYST;  Surgeon: Patton Salles, MD;  Location: [facility];  Service: Gynecology;  Laterality: Left;   UNILATERAL SALPINGECTOMY Left 07/21/2015   Procedure: DISTAL UNILATERAL SALPINGECTOMY;  Surgeon: Patton Salles, MD;  Location: [facility];  Service: Gynecology;  Laterality: Left;    Current Outpatient Medications  Medication Sig Dispense Refill   cetirizine (ZYRTEC) 10 MG tablet Take 10 mg by mouth daily.     Cholecalciferol (VITAMIN D-3 PO) Take 1 tablet by mouth daily.     ELDERBERRY PO Take 1 tablet by mouth daily.     ibuprofen (ADVIL,MOTRIN) 200 MG tablet Take 600-800 mg by mouth every 8 (eight) hours as needed.      loratadine (CLARITIN) 10 MG tablet Take 10 mg by mouth daily.     Multiple Vitamins-Minerals (MULTI ADULT GUMMIES) CHEW      norethindrone (MICRONOR) 0.35 MG tablet Take 1 tablet (0.35 mg total) by mouth daily. 84 tablet 3  valACYclovir (VALTREX) 500 MG tablet Take one tablet by mouth daily. 100 tablet 0   zinc gluconate 50 MG tablet Take 50 mg by mouth daily.     No current facility-administered medications for this visit.    Family History  Problem Relation Age of Onset   Colon polyps Mother        multiple pre-cancerous polyps on several colonoscopies   Diabetes Father    Hypertension Father    Hyperlipidemia Father    Diabetes Paternal Grandmother    Hyperlipidemia Paternal Grandmother    Hypertension Paternal Grandmother     Review of Systems  All other systems reviewed and are negative.  Exam:   BP 104/60    Pulse 72   Ht 5' 3.75" (1.619 m)   Wt 125 lb (56.7 kg)   LMP 02/26/2021 (Approximate)   SpO2 98%   BMI 21.62 kg/m     General appearance: alert, cooperative and appears stated age Head: normocephalic, without obvious abnormality, atraumatic Neck: no adenopathy, supple, symmetrical, trachea midline and thyroid normal to inspection and palpation Lungs: clear to auscultation bilaterally Breasts: bilateral implants, no masses or tenderness, No nipple retraction or dimpling, No nipple discharge or bleeding, No axillary adenopathy Heart: regular rate and rhythm Abdomen: soft, non-tender; no masses, no organomegaly Extremities: extremities normal, atraumatic, no cyanosis or edema Skin: skin color, texture, turgor normal. No rashes or lesions Lymph nodes: cervical, supraclavicular, and axillary nodes normal. Neurologic: grossly normal  Pelvic: External genitalia:  Thin tissue of the perineal body.                No abnormal inguinal nodes palpated.              Urethra:  normal appearing urethra with no masses, tenderness or lesions              Bartholins and Skenes: normal                 Vagina: normal appearing vagina with normal color and discharge, no lesions              Cervix: no lesions              Pap taken: yes Bimanual Exam:  Uterus:  normal size, contour, position, consistency, mobility, non-tender              Adnexa: no mass, fullness, tenderness               Chaperone was present for exam:  Marchelle Folks, CMA  Assessment:   Well woman visit with gynecologic exam. Hx recurrent ASCUS paps.  Status post BTL. Smoker over age of 83. POPs for cycle control. Breast augmentation. Hx HSV.  Hx low ferritin.  Perineal fissures.  FH of precancerous polyps.    Plan: Mammogram screening discussed. Self breast awareness reviewed. Pap and HR HPV as above. Guidelines for Calcium, Vitamin D, regular exercise program including cardiovascular and weight bearing exercise. Routine  labs.  Rx for Micronor and Valtrex.  Rx for vaginal estrogen cream to use two to three times weekly.  Colonoscopy age 51.   Follow up annually and prn.    After visit summary provided.

## 2021-03-10 ENCOUNTER — Telehealth: Payer: Self-pay

## 2021-03-10 NOTE — Telephone Encounter (Signed)
Patient called to inquire if she could have lab orders placed and have her labs drawn early on Monday morning prior to 3pm visit. I spoke with her and let her know Dr. Edward Jolly out of office until Monday.  I advised that the labs we order are screening labs and we do not instruct patients to fast. If something comes back elevated the provider will make recommendation and occasionally will need to return to repeat lab.  I explained also that sometimes within the visit the provider wants to order something additional to the screening labs and doing labs before appointment would require a second venipuncture. She is fine with waiting until her visit at 3:00pm.

## 2021-03-12 ENCOUNTER — Ambulatory Visit: Payer: BC Managed Care – PPO | Admitting: Physician Assistant

## 2021-03-15 ENCOUNTER — Ambulatory Visit (INDEPENDENT_AMBULATORY_CARE_PROVIDER_SITE_OTHER): Payer: BC Managed Care – PPO | Admitting: Obstetrics and Gynecology

## 2021-03-15 ENCOUNTER — Encounter: Payer: Self-pay | Admitting: Obstetrics and Gynecology

## 2021-03-15 ENCOUNTER — Ambulatory Visit: Payer: BC Managed Care – PPO | Admitting: Physician Assistant

## 2021-03-15 ENCOUNTER — Other Ambulatory Visit (HOSPITAL_COMMUNITY)
Admission: RE | Admit: 2021-03-15 | Discharge: 2021-03-15 | Disposition: A | Discharge status: Home / Self Care | Payer: BC Managed Care – PPO | Source: Ambulatory Visit | Attending: Obstetrics and Gynecology | Admitting: Obstetrics and Gynecology

## 2021-03-15 ENCOUNTER — Other Ambulatory Visit: Payer: Self-pay

## 2021-03-15 VITALS — BP 104/60 | HR 72 | Ht 63.75 in | Wt 125.0 lb

## 2021-03-15 DIAGNOSIS — Z01419 Encounter for gynecological examination (general) (routine) without abnormal findings: Secondary | ICD-10-CM | POA: Diagnosis not present

## 2021-03-15 DIAGNOSIS — Z1151 Encounter for screening for human papillomavirus (HPV): Secondary | ICD-10-CM | POA: Insufficient documentation

## 2021-03-15 DIAGNOSIS — Z8742 Personal history of other diseases of the female genital tract: Secondary | ICD-10-CM | POA: Diagnosis not present

## 2021-03-15 MED ORDER — NORETHINDRONE 0.35 MG PO TABS: 1.0000 | ORAL_TABLET | Freq: Every day | ORAL | 3 refills | Status: DC

## 2021-03-15 MED ORDER — VALACYCLOVIR HCL 500 MG PO TABS: ORAL_TABLET | ORAL | 3 refills | Status: DC

## 2021-03-15 MED ORDER — ESTRADIOL 0.1 MG/GM VA CREA: TOPICAL_CREAM | VAGINAL | 2 refills | Status: DC

## 2021-03-15 NOTE — Patient Instructions (Signed)

## 2021-03-16 LAB — COMPREHENSIVE METABOLIC PANEL
AG Ratio: 1.6 (calc) (ref 1.0–2.5)
ALT: 11 U/L (ref 6–29)
AST: 18 U/L (ref 10–30)
Albumin: 4.5 g/dL (ref 3.6–5.1)
Alkaline phosphatase (APISO): 34 U/L (ref 31–125)
BUN: 11 mg/dL (ref 7–25)
CO2: 29 mmol/L (ref 20–32)
Calcium: 9.4 mg/dL (ref 8.6–10.2)
Chloride: 101 mmol/L (ref 98–110)
Creat: 0.84 mg/dL (ref 0.50–0.97)
Globulin: 2.9 g/dL (calc) (ref 1.9–3.7)
Glucose, Bld: 83 mg/dL (ref 65–99)
Potassium: 3.9 mmol/L (ref 3.5–5.3)
Sodium: 138 mmol/L (ref 135–146)
Total Bilirubin: 0.4 mg/dL (ref 0.2–1.2)
Total Protein: 7.4 g/dL (ref 6.1–8.1)

## 2021-03-16 LAB — CBC
HCT: 37.8 % (ref 35.0–45.0)
Hemoglobin: 12.3 g/dL (ref 11.7–15.5)
MCH: 31.9 pg (ref 27.0–33.0)
MCHC: 32.5 g/dL (ref 32.0–36.0)
MCV: 98.2 fL (ref 80.0–100.0)
MPV: 9.6 fL (ref 7.5–12.5)
Platelets: 362 10*3/uL (ref 140–400)
RBC: 3.85 10*6/uL (ref 3.80–5.10)
RDW: 11.9 % (ref 11.0–15.0)
WBC: 8.3 10*3/uL (ref 3.8–10.8)

## 2021-03-16 LAB — LIPID PANEL
Cholesterol: 190 mg/dL (ref <200)
HDL: 70 mg/dL (ref >=50)
LDL Cholesterol (Calc): 106 mg/dL (calc) — ABNORMAL HIGH
Non-HDL Cholesterol (Calc): 120 mg/dL (calc) (ref <130)
Total CHOL/HDL Ratio: 2.7 (calc) (ref <5.0)
Triglycerides: 62 mg/dL (ref <150)

## 2021-03-16 LAB — IRON: Iron: 120 ug/dL (ref 40–190)

## 2021-03-16 LAB — TSH: TSH: 1.78 mIU/L

## 2021-03-16 LAB — FERRITIN: Ferritin: 5 ng/mL — ABNORMAL LOW (ref 16–154)

## 2021-03-17 LAB — CYTOLOGY - PAP
Adequacy: ABSENT
Comment: NEGATIVE
Diagnosis: UNDETERMINED — AB
High risk HPV: NEGATIVE

## 2021-03-18 ENCOUNTER — Encounter: Payer: Self-pay | Admitting: Physician Assistant

## 2021-03-21 ENCOUNTER — Encounter: Payer: Self-pay | Admitting: Obstetrics and Gynecology

## 2021-03-21 DIAGNOSIS — R790 Abnormal level of blood mineral: Secondary | ICD-10-CM

## 2021-03-22 NOTE — Telephone Encounter (Signed)
Please let the patient know that I will place referral to Hematology for ferritin deficiency.

## 2021-04-13 ENCOUNTER — Telehealth: Payer: Self-pay | Admitting: Oncology

## 2021-04-13 NOTE — Telephone Encounter (Signed)
Patient rescheduled from 9/1 to 9/21 Labs 10:30 am - New Patient Consult 11:00 am for Low Ferritin

## 2021-04-15 ENCOUNTER — Inpatient Hospital Stay: Payer: BC Managed Care – PPO | Admitting: Oncology

## 2021-04-15 ENCOUNTER — Inpatient Hospital Stay: Payer: BC Managed Care – PPO

## 2021-05-04 NOTE — Progress Notes (Signed)
[facility]  [address] [place],  [place]  [address] [phone]  Clinic Day:  05/05/2021  Referring physician: Ardell Isaacs, Debbe Bales*   HISTORY OF PRESENT ILLNESS:  The patient is a 40 y.o. female  who I was asked to consult upon for a low ferrtin.  In August 2022, labs showed a normal hemoglobin of 12.3, but with a low ferritin of 5.  This was checked due to her very heavy menstrual cycles, which she claims last as long as 7-10 days at a time.  It has not been uncommon for her to bleed for an entire month or have 2 cycles in a month.  She has been been on oral contraception for the past 7-8 months, but her cycles have remained relatively heavy.  She claims to have occasional rectal bleeding, which occurs once every 1-2 months.   She has tried taking oral iron in the past, but it causes significant GI upset.  To her knowledge, there is no family history of anemia.  PAST MEDICAL HISTORY:   Past Medical History:  Diagnosis Date   Abnormal uterine bleeding (AUB)    Acute hearing loss    Right   Anemia    Dizziness    Elevated AST (SGOT) 10/2016   Endometriosis    Left ovarian cyst    Low ferritin    Migraines    without aura   STD (sexually transmitted disease)    HX HSV   Tinnitus, right ear     PAST SURGICAL HISTORY:   Past Surgical History:  Procedure Laterality Date   ABLATION ON ENDOMETRIOSIS N/A 07/21/2015   Procedure: FULGERATION OF ENDOMETRIOSIS, BIOPSY OF POSTERIOR CUL DE SAC;  Surgeon: Patton Salles, MD;  Location: [facility];  Service: Gynecology;  Laterality: N/A;   APPENDECTOMY  2004   AUGMENTATION MAMMAPLASTY  2011   CESAREAN SECTION  2003   CHOLECYSTECTOMY  2004   LAPAROSCOPIC TUBAL LIGATION Right 07/21/2015   Procedure: LAPAROSCOPIC TUBAL LIGATION;  Surgeon: Patton Salles, MD;  Location: [facility];  Service: Gynecology;  Laterality: Right;   OVARIAN CYST REMOVAL Left 07/21/2015   Procedure: EXCISION  PARA-OVARIAN CYST;  Surgeon: Patton Salles, MD;  Location: [facility];  Service: Gynecology;  Laterality: Left;   UNILATERAL SALPINGECTOMY Left 07/21/2015   Procedure: DISTAL UNILATERAL SALPINGECTOMY;  Surgeon: Patton Salles, MD;  Location: [facility];  Service: Gynecology;  Laterality: Left;    CURRENT MEDICATIONS:   Current Outpatient Medications  Medication Sig Dispense Refill   cetirizine (ZYRTEC) 10 MG tablet Take 10 mg by mouth daily.     Cholecalciferol (VITAMIN D-3 PO) Take 1 tablet by mouth daily.     ELDERBERRY PO Take 1 tablet by mouth daily.     estradiol (ESTRACE) 0.1 MG/GM vaginal cream Use 1/2 g vaginally two or three times per week as needed to maintain symptom relief. 42.5 g 2   ibuprofen (ADVIL,MOTRIN) 200 MG tablet Take 600-800 mg by mouth every 8 (eight) hours as needed.      loratadine (CLARITIN) 10 MG tablet Take 10 mg by mouth daily.     Multiple Vitamins-Minerals (MULTI ADULT GUMMIES) CHEW      norethindrone (MICRONOR) 0.35 MG tablet Take 1 tablet (0.35 mg total) by mouth daily. 84 tablet 3   valACYclovir (VALTREX) 500 MG tablet Take one tablet by mouth daily for prevention of infection.  Take one tablet twice a  day for 3 days as needed for an outbreak. 100 tablet 3   zinc gluconate 50 MG tablet Take 50 mg by mouth daily.     No current facility-administered medications for this visit.    ALLERGIES:  No Known Allergies  FAMILY HISTORY:   Family History  Problem Relation Age of Onset   Colon polyps Mother        multiple pre-cancerous polyps on several colonoscopies   Diabetes Father    Hypertension Father    Hyperlipidemia Father    Diabetes Paternal Grandmother    Hyperlipidemia Paternal Grandmother    Hypertension Paternal Grandmother     SOCIAL HISTORY:  The patient was born and raised in [place].  She lives in town with her husband of 4 months.  She has a 60 year old son.  She works at a [employer].  She  drinks alcohol occasionally.  She is also a social smoker.  REVIEW OF SYSTEMS:  Review of Systems  Constitutional:  Positive for fatigue. Negative for fever.  HENT:   Negative for hearing loss and sore throat.   Eyes:  Negative for eye problems.  Respiratory:  Negative for chest tightness, cough and hemoptysis.   Cardiovascular:  Negative for chest pain and palpitations.  Gastrointestinal:  Negative for abdominal distention, abdominal pain, blood in stool, constipation, diarrhea, nausea and vomiting.  Endocrine: Negative for hot flashes.  Genitourinary:  Negative for difficulty urinating, dysuria, frequency, hematuria and nocturia.   Musculoskeletal:  Negative for arthralgias, back pain, gait problem and myalgias.  Skin: Negative.  Negative for itching and rash.  Neurological:  Positive for headaches. Negative for dizziness, extremity weakness, gait problem, light-headedness and numbness.  Hematological: Negative.   Psychiatric/Behavioral: Negative.  Negative for depression and suicidal ideas. The patient is not nervous/anxious.     PHYSICAL EXAM:  Blood pressure 129/85, pulse 61, temperature 98.6 F (37 C), resp. rate 14, height 5' 3.75" (1.619 m), weight 129 lb 12.8 oz (58.9 kg), SpO2 98 %. Wt Readings from Last 3 Encounters:  05/05/21 129 lb 12.8 oz (58.9 kg)  03/15/21 125 lb (56.7 kg)  12/07/20 129 lb (58.5 kg)   Body mass index is 22.46 kg/m. Performance status (ECOG): 0 - Asymptomatic Physical Exam Constitutional:      Appearance: Normal appearance. She is not ill-appearing.  HENT:     Mouth/Throat:     Mouth: Mucous membranes are moist.     Pharynx: Oropharynx is clear. No oropharyngeal exudate or posterior oropharyngeal erythema.  Cardiovascular:     Rate and Rhythm: Normal rate and regular rhythm.     Heart sounds: No murmur heard.   No friction rub. No gallop.  Pulmonary:     Effort: Pulmonary effort is normal. No respiratory distress.     Breath sounds: Normal  breath sounds. No wheezing, rhonchi or rales.  Abdominal:     General: Bowel sounds are normal. There is no distension.     Palpations: Abdomen is soft. There is no mass.     Tenderness: There is no abdominal tenderness.  Musculoskeletal:        General: No swelling.     Right lower leg: No edema.     Left lower leg: No edema.  Lymphadenopathy:     Cervical: No cervical adenopathy.     Upper Body:     Right upper body: No supraclavicular or axillary adenopathy.     Left upper body: No supraclavicular or axillary adenopathy.  Lower Body: No right inguinal adenopathy. No left inguinal adenopathy.  Skin:    General: Skin is warm.     Coloration: Skin is not jaundiced.     Findings: No lesion or rash.  Neurological:     General: No focal deficit present.     Mental Status: She is alert and oriented to person, place, and time. Mental status is at baseline.     Cranial Nerves: Cranial nerves are intact.  Psychiatric:        Mood and Affect: Mood normal.        Behavior: Behavior normal.        Thought Content: Thought content normal.   LABS:     Ref. Range 05/05/2021 10:16  Iron Latest Ref Range: 28 - 170 ug/dL 812  UIBC Latest Units: ug/dL 751  TIBC Latest Ref Range: 250 - 450 ug/dL 700 (H)  Saturation Ratios Latest Ref Range: 10.4 - 31.8 % 28  Ferritin Latest Ref Range: 11 - 307 ng/mL 7 (L)  Folate Latest Ref Range: >5.9 ng/mL 9.5  Vitamin B12 Latest Ref Range: 180 - 914 pg/mL 216     ASSESSMENT & PLAN:  A 40 y.o. female who I was asked to consult upon for a low ferritin level.  This is likely due to her heavy menstrual cycles.  Despite her MCV being in the upper limits of normal, her iron parameters today clearly show a low ferritin of 7 and an elevated total iron binding capacity of 496.  These numbers are consistent with iron deficiency anemia.  Based upon this, I will arrange for her to receive IV iron over these next few weeks to replenish her iron stores.  I will see  her back in 4 months to reassess her iron and hemoglobin levels.  The patient understands all the plans discussed today and is in agreement with them.   I do appreciate Janean Sark* for his new consult.   Willie [place] Kirby Funk, MD

## 2021-05-05 ENCOUNTER — Other Ambulatory Visit: Payer: Self-pay | Admitting: Oncology

## 2021-05-05 ENCOUNTER — Encounter: Payer: Self-pay | Admitting: Oncology

## 2021-05-05 ENCOUNTER — Inpatient Hospital Stay: Payer: BC Managed Care – PPO | Admitting: Oncology

## 2021-05-05 ENCOUNTER — Encounter: Payer: Self-pay | Admitting: Physician Assistant

## 2021-05-05 ENCOUNTER — Inpatient Hospital Stay: Payer: BC Managed Care – PPO | Attending: Oncology

## 2021-05-05 ENCOUNTER — Ambulatory Visit: Payer: BC Managed Care – PPO | Admitting: Physician Assistant

## 2021-05-05 VITALS — BP 110/78 | HR 85 | Temp 97.3°F | Ht 63.74 in | Wt 130.0 lb

## 2021-05-05 DIAGNOSIS — J060 Acute laryngopharyngitis: Secondary | ICD-10-CM

## 2021-05-05 DIAGNOSIS — R790 Abnormal level of blood mineral: Secondary | ICD-10-CM | POA: Diagnosis not present

## 2021-05-05 DIAGNOSIS — D500 Iron deficiency anemia secondary to blood loss (chronic): Secondary | ICD-10-CM | POA: Insufficient documentation

## 2021-05-05 DIAGNOSIS — Z79899 Other long term (current) drug therapy: Secondary | ICD-10-CM | POA: Diagnosis not present

## 2021-05-05 DIAGNOSIS — D649 Anemia, unspecified: Secondary | ICD-10-CM

## 2021-05-05 LAB — CBC AND DIFFERENTIAL
HCT: 37 (ref 36–46)
Hemoglobin: 12.3 (ref 12.0–16.0)
Neutrophils Absolute: 3.77
Platelets: 321 (ref 150–399)
WBC: 7.4

## 2021-05-05 LAB — HEPATIC FUNCTION PANEL
ALT: 24 (ref 7–35)
AST: 33 (ref 13–35)
Alkaline Phosphatase: 40 (ref 25–125)
Bilirubin, Total: 0.4

## 2021-05-05 LAB — POC COVID19 BINAXNOW: SARS Coronavirus 2 Ag: NEGATIVE

## 2021-05-05 LAB — IRON AND TIBC
Iron: 138 ug/dL (ref 28–170)
Saturation Ratios: 28 % (ref 10.4–31.8)
TIBC: 496 ug/dL — ABNORMAL HIGH (ref 250–450)
UIBC: 358 ug/dL

## 2021-05-05 LAB — BASIC METABOLIC PANEL
BUN: 14 (ref 4–21)
CO2: 28 — AB (ref 13–22)
Chloride: 101 (ref 99–108)
Creatinine: 0.9 (ref 0.5–1.1)
Glucose: 95
Potassium: 3.4 (ref 3.4–5.3)
Sodium: 140 (ref 137–147)

## 2021-05-05 LAB — COMPREHENSIVE METABOLIC PANEL
Albumin: 4.7 (ref 3.5–5.0)
Calcium: 10.1 (ref 8.7–10.7)

## 2021-05-05 LAB — FERRITIN: Ferritin: 7 ng/mL — ABNORMAL LOW (ref 11–307)

## 2021-05-05 LAB — CBC: RBC: 3.81 — AB (ref 3.87–5.11)

## 2021-05-05 LAB — FOLATE: Folate: 9.5 ng/mL (ref >5.9)

## 2021-05-05 LAB — VITAMIN B12: Vitamin B-12: 216 pg/mL (ref 180–914)

## 2021-05-05 MED ORDER — TRIAMCINOLONE ACETONIDE 40 MG/ML IJ SUSP
60.0000 mg | Freq: Once | INTRAMUSCULAR | Status: AC
  Administered 2021-05-05: 60 mg via INTRAMUSCULAR

## 2021-05-05 MED ORDER — AZITHROMYCIN 250 MG PO TABS: ORAL_TABLET | ORAL | 0 refills | Status: AC

## 2021-05-05 NOTE — Progress Notes (Signed)
Acute Office Visit  Subjective:    Patient ID: Ann Whitehead, female    DOB: [birth year], 40 y.o.   MRN: [id number]  Chief Complaint  Patient presents with   URI    Symptoms started last Thursday.    HPI Patient is in today for uri symptoms - complains of congestion , sneezing, sinus pressure and pnd - complains of pain behind both eyes - she usually does well with kenalog shot for allergies and requests today Denies fever but has had malaise and thick green pnd  Past Medical History:  Diagnosis Date   Abnormal uterine bleeding (AUB)    Acute hearing loss    Right   Anemia    Dizziness    Elevated AST (SGOT) 10/2016   Endometriosis    Left ovarian cyst    Low ferritin    Migraines    without aura   STD (sexually transmitted disease)    HX HSV   Tinnitus, right ear     Past Surgical History:  Procedure Laterality Date   ABLATION ON ENDOMETRIOSIS N/A 07/21/2015   Procedure: FULGERATION OF ENDOMETRIOSIS, BIOPSY OF POSTERIOR CUL DE SAC;  Surgeon: Patton Salles, MD;  Location: [facility];  Service: Gynecology;  Laterality: N/A;   APPENDECTOMY  2004   AUGMENTATION MAMMAPLASTY  2011   CESAREAN SECTION  2003   CHOLECYSTECTOMY  2004   LAPAROSCOPIC TUBAL LIGATION Right 07/21/2015   Procedure: LAPAROSCOPIC TUBAL LIGATION;  Surgeon: Patton Salles, MD;  Location: [facility];  Service: Gynecology;  Laterality: Right;   OVARIAN CYST REMOVAL Left 07/21/2015   Procedure: EXCISION PARA-OVARIAN CYST;  Surgeon: Patton Salles, MD;  Location: [facility];  Service: Gynecology;  Laterality: Left;   UNILATERAL SALPINGECTOMY Left 07/21/2015   Procedure: DISTAL UNILATERAL SALPINGECTOMY;  Surgeon: Patton Salles, MD;  Location: [facility];  Service: Gynecology;  Laterality: Left;    Family History  Problem Relation Age of Onset   Colon polyps Mother        multiple pre-cancerous polyps on several colonoscopies   Diabetes Father    Hypertension Father     Hyperlipidemia Father    Diabetes Paternal Grandmother    Hyperlipidemia Paternal Grandmother    Hypertension Paternal Grandmother     Social History   Socioeconomic History   Marital status: Married    Spouse name: SCOTT   Number of children: 1   Years of education: 12 + 2   Highest education level: Not on file  Occupational History   Occupation: [employer]  Tobacco Use   Smoking status: Light Smoker    Types: Cigarettes   Smokeless tobacco: Never   Tobacco comments:    smokes 5 cigs/week  Vaping Use   Vaping Use: Never used  Substance and Sexual Activity   Alcohol use: Not Currently    Comment: 3 beers/month   Drug use: No   Sexual activity: Yes    Partners: Male    Birth control/protection: Surgical    Comment: Tubal  Other Topics Concern   Not on file  Social History Narrative   Not on file   Social Determinants of Health   Financial Resource Strain: Not on file  Food Insecurity: Not on file  Transportation Needs: Not on file  Physical Activity: Not on file  Stress: Not on file  Social Connections: Not on file  Intimate Partner Violence: Not on file    Outpatient Medications Prior  to Visit  Medication Sig Dispense Refill   cetirizine (ZYRTEC) 10 MG tablet Take 10 mg by mouth daily.     Cholecalciferol (VITAMIN D-3 PO) Take 1 tablet by mouth daily.     ELDERBERRY PO Take 1 tablet by mouth daily.     estradiol (ESTRACE) 0.1 MG/GM vaginal cream Use 1/2 g vaginally two or three times per week as needed to maintain symptom relief. 42.5 g 2   ibuprofen (ADVIL,MOTRIN) 200 MG tablet Take 600-800 mg by mouth every 8 (eight) hours as needed.      loratadine (CLARITIN) 10 MG tablet Take 10 mg by mouth daily.     Multiple Vitamins-Minerals (MULTI ADULT GUMMIES) CHEW      norethindrone (MICRONOR) 0.35 MG tablet Take 1 tablet (0.35 mg total) by mouth daily. 84 tablet 3   valACYclovir (VALTREX) 500 MG tablet Take one tablet by mouth daily for prevention of  infection.  Take one tablet twice a day for 3 days as needed for an outbreak. 100 tablet 3   zinc gluconate 50 MG tablet Take 50 mg by mouth daily.     No facility-administered medications prior to visit.    No Known Allergies  Review of Systems CONSTITUTIONAL: see HPI E/N/T: see HPI CARDIOVASCULAR: Negative for chest pain, dizziness, palpitations and pedal edema.  RESPIRATORY: see HPI        Objective:    Physical Exam PHYSICAL EXAM:   VS: BP 110/78   Pulse 85   Temp (!) 97.3 F (36.3 C)   Ht 5' 3.74" (1.619 m)   Wt 130 lb (59 kg)   SpO2 100%   BMI 22.50 kg/m   GEN: Well nourished, well developed, in no acute distress  HEENT: TMS clear Oropharynx - erythema/pnd  Cardiac: RRR; no murmurs, rubs, or gallops,no edema -  Respiratory:  normal respiratory rate and pattern with no distress - normal breath sounds with no rales, rhonchi, wheezes or rubs  Office Visit on 05/05/2021  Component Date Value Ref Range Status   SARS Coronavirus 2 Ag 05/05/2021 Negative  Negative Final  Abstract on 05/05/2021  Component Date Value Ref Range Status   Hemoglobin 05/05/2021 12.3  12.0 - 16.0 Final   HCT 05/05/2021 37  36 - 46 Final   Neutrophils Absolute 05/05/2021 3.77   Final   Platelets 05/05/2021 321  150 - 399 Final   WBC 05/05/2021 7.4   Final   RBC 05/05/2021 3.81 (A) 3.87 - 5.11 Final   Glucose 05/05/2021 95   Final   BUN 05/05/2021 14  4 - 21 Final   CO2 05/05/2021 28 (A) 13 - 22 Final   Creatinine 05/05/2021 0.9  0.5 - 1.1 Final   Potassium 05/05/2021 3.4  3.4 - 5.3 Final   Sodium 05/05/2021 140  137 - 147 Final   Chloride 05/05/2021 101  99 - 108 Final   Calcium 05/05/2021 10.1  8.7 - 10.7 Final   Albumin 05/05/2021 4.7  3.5 - 5.0 Final   Alkaline Phosphatase 05/05/2021 40  25 - 125 Final   ALT 05/05/2021 24  7 - 35 Final   AST 05/05/2021 33  13 - 35 Final   Bilirubin, Total 05/05/2021 0.4   Final     BP 110/78   Pulse 85   Temp (!) 97.3 F (36.3 C)   Ht 5'  3.74" (1.619 m)   Wt 130 lb (59 kg)   SpO2 100%   BMI 22.50 kg/m  Wt Readings from  Last 3 Encounters:  05/05/21 130 lb (59 kg)  05/05/21 129 lb 12.8 oz (58.9 kg)  03/15/21 125 lb (56.7 kg)    Health Maintenance Due  Topic Date Due   COVID-19 Vaccine (3 - Booster for Pfizer series) 09/25/2020   INFLUENZA VACCINE  03/15/2021    There are no preventive care reminders to display for this patient.   Lab Results  Component Value Date   TSH 1.78 03/15/2021   Lab Results  Component Value Date   WBC 7.4 05/05/2021   HGB 12.3 05/05/2021   HCT 37 05/05/2021   MCV 98.2 03/15/2021   PLT 321 05/05/2021   Lab Results  Component Value Date   NA 140 05/05/2021   K 3.4 05/05/2021   CO2 28 (A) 05/05/2021   GLUCOSE 83 03/15/2021   BUN 14 05/05/2021   CREATININE 0.9 05/05/2021   BILITOT 0.4 03/15/2021   ALKPHOS 40 05/05/2021   AST 33 05/05/2021   ALT 24 05/05/2021   PROT 7.4 03/15/2021   ALBUMIN 4.7 05/05/2021   CALCIUM 10.1 05/05/2021   Lab Results  Component Value Date   CHOL 190 03/15/2021   Lab Results  Component Value Date   HDL 70 03/15/2021   Lab Results  Component Value Date   LDLCALC 106 (H) 03/15/2021   Lab Results  Component Value Date   TRIG 62 03/15/2021   Lab Results  Component Value Date   CHOLHDL 2.7 03/15/2021   No results found for: HGBA1C     Assessment & Plan:  1. Acute laryngopharyngitis - POC COVID-19 BinaxNow - triamcinolone acetonide (KENALOG-40) injection 60 mg - azithromycin (ZITHROMAX) 250 MG tablet; Take 2 tablets on day 1, then 1 tablet daily on days 2 through 5  Dispense: 6 tablet; Refill: 0    Meds ordered this encounter  Medications   triamcinolone acetonide (KENALOG-40) injection 60 mg   azithromycin (ZITHROMAX) 250 MG tablet    Sig: Take 2 tablets on day 1, then 1 tablet daily on days 2 through 5    Dispense:  6 tablet    Refill:  0    Order Specific Question:   Supervising Provider    AnswerCorey Harold     Orders Placed This Encounter  Procedures   POC COVID-19 BinaxNow     Follow-up: Return if symptoms worsen or fail to improve.  An After Visit Summary was printed and given to the patient.  Jettie Pagan [facility] [phone]

## 2021-05-06 ENCOUNTER — Telehealth: Payer: Self-pay | Admitting: Oncology

## 2021-05-06 NOTE — Telephone Encounter (Signed)
Per 9/21 LOS next appt scheduled and given to patient 

## 2021-05-10 ENCOUNTER — Encounter: Payer: Self-pay | Admitting: Oncology

## 2021-05-13 ENCOUNTER — Telehealth: Payer: Self-pay

## 2021-05-13 MED FILL — Iron Sucrose Inj 20 MG/ML (Fe Equiv): INTRAVENOUS | Qty: 15 | Status: AC

## 2021-05-13 NOTE — Telephone Encounter (Signed)
Called patient regarding time change from afternoon on 05/14/2021 to 0930. Patient confirmed time and agrees.

## 2021-05-14 ENCOUNTER — Inpatient Hospital Stay: Payer: BC Managed Care – PPO

## 2021-05-14 ENCOUNTER — Other Ambulatory Visit: Payer: Self-pay | Admitting: Hematology and Oncology

## 2021-05-14 ENCOUNTER — Other Ambulatory Visit (INDEPENDENT_AMBULATORY_CARE_PROVIDER_SITE_OTHER): Payer: BC Managed Care – PPO | Admitting: Hematology and Oncology

## 2021-05-14 ENCOUNTER — Telehealth: Payer: Self-pay | Admitting: Oncology

## 2021-05-14 ENCOUNTER — Other Ambulatory Visit: Payer: Self-pay

## 2021-05-14 VITALS — BP 132/83 | HR 76 | Resp 18 | Ht 63.75 in | Wt 130.0 lb

## 2021-05-14 VITALS — BP 115/76 | HR 74 | Temp 98.1°F | Resp 16 | Ht 63.75 in | Wt 130.0 lb

## 2021-05-14 DIAGNOSIS — D500 Iron deficiency anemia secondary to blood loss (chronic): Secondary | ICD-10-CM

## 2021-05-14 DIAGNOSIS — Z79899 Other long term (current) drug therapy: Secondary | ICD-10-CM | POA: Diagnosis not present

## 2021-05-14 MED ORDER — EPINEPHRINE 0.3 MG/0.3ML IJ SOAJ: 0.3000 mg | INTRAMUSCULAR | 3 refills | Status: AC | PRN

## 2021-05-14 MED ORDER — FAMOTIDINE IN NACL 20-0.9 MG/50ML-% IV SOLN
20.0000 mg | Freq: Once | INTRAVENOUS | Status: AC | PRN
  Administered 2021-05-14: 20 mg via INTRAVENOUS

## 2021-05-14 MED ORDER — DIPHENHYDRAMINE HCL 50 MG/ML IJ SOLN
50.0000 mg | Freq: Once | INTRAMUSCULAR | Status: AC | PRN
  Administered 2021-05-14: 50 mg via INTRAVENOUS

## 2021-05-14 MED ORDER — IRON SUCROSE 20 MG/ML IV SOLN
300.0000 mg | Freq: Once | INTRAVENOUS | Status: AC
  Administered 2021-05-14: 300 mg via INTRAVENOUS
  Filled 2021-05-14: qty 10

## 2021-05-14 MED ORDER — SODIUM CHLORIDE 0.9 % IV SOLN: Freq: Once | INTRAVENOUS | Status: AC

## 2021-05-14 NOTE — Patient Instructions (Addendum)
Famotidine Solution for Injection What is this medication? FAMOTIDINE (fa MOE ti deen) treats stomach ulcers, reflux disease, or other conditions that cause too much stomach acid. It works by reducing the amount of acid in the stomach. This medicine may be used for other purposes; ask your health care provider or pharmacist if you have questions. COMMON BRAND NAME(S): Pepcid What should I tell my care team before I take this medication? They need to know if you have any of these conditions: Kidney or liver disease An unusual or allergic reaction to famotidine, other medications, foods, dyes, or preservatives Pregnant or trying to get pregnant Breast-feeding How should I use this medication? This medication is for infusion into a vein. It is given in a hospital or clinic setting. Talk to your care team regarding the use of this medication in children. Special care may be needed. Overdosage: If you think you have taken too much of this medicine contact a poison control center or emergency room at once. NOTE: This medicine is only for you. Do not share this medicine with others. What if I miss a dose? This does not apply. What may interact with this medication? Delavirdine Itraconazole Ketoconazole This list may not describe all possible interactions. Give your health care provider a list of all the medicines, herbs, non-prescription drugs, or dietary supplements you use. Also tell them if you smoke, drink alcohol, or use illegal drugs. Some items may interact with your medicine. What should I watch for while using this medication? Tell your doctor or health care professional if your condition does not start to get better or gets worse. Do not take with aspirin, ibuprofen, or other antiinflammatory medications. These can aggravate your condition. Do not smoke cigarettes or drink alcohol. These increase irritation in your stomach and can increase the time it will take for ulcers to heal.  Cigarettes and alcohol can also worsen acid reflux or heartburn. If you get black, tarry stools or vomit up what looks like coffee grounds, call your doctor or health care professional at once. You may have a bleeding ulcer. This medication may cause a decrease in vitamin B12. Make sure that you get enough vitamin B12 while you are taking this medication. Discuss the foods you eat and the vitamins you take with your care team. What side effects may I notice from receiving this medication? Side effects that you should report to your care team as soon as possible: Allergic reactions-skin rash, itching, hives, swelling of the face, lips, tongue, or throat Confusion Hallucinations Side effects that usually do not require medical attention (report to your care team if they continue or are bothersome): Constipation Diarrhea Dizziness Headache This list may not describe all possible side effects. Call your doctor for medical advice about side effects. You may report side effects to FDA at 1-800-FDA-1088. Where should I keep my medication? This medication is given in a hospital or clinic. You will not be given this medication to store at home. NOTE: This sheet is a summary. It may not cover all possible information. If you have questions about this medicine, talk to your doctor, pharmacist, or health care provider.  2022 Elsevier/Gold Standard (2020-06-17 10:05:26) Diphenhydramine Injection What is this medication? DIPHENHYDRAMINE (dye fen HYE dra meen) treats the symptoms of allergic reactions. It may also be used to prevent and treat motion sickness or the symptoms of Parkinson disease. It works by blocking histamine, a substance released by the body during an allergic reaction. It belongs to a  group of medications called antihistamines. This medicine may be used for other purposes; ask your health care provider or pharmacist if you have questions. COMMON BRAND NAME(S): Benadryl What should I tell  my care team before I take this medication? They need to know if you have any of these conditions: Asthma or lung disease Glaucoma High blood pressure or heart disease Liver disease Pain or difficulty passing urine Prostate trouble Ulcers or other stomach problems An unusual or allergic reaction to diphenhydramine, antihistamines, other medications foods, dyes, or preservatives Pregnant or trying to get pregnant Breast-feeding How should I use this medication? This medication is for injection into a vein or a muscle. It is usually given in a hospital or clinic. If you get this medication at home, you will be taught how to prepare and give this medication. Use exactly as directed. Take your medication at regular intervals. Do not take your medication more often than directed. It is important that you put your used needles and syringes in a special sharps container. Do not put them in a trash can. If you do not have a sharps container, call your pharmacist or care team to get one. Talk to your care team regarding the use of this medication in children. While this medication may be prescribed for selected conditions, precautions do apply. This medication is not approved for use in newborns and premature babies. Patients over 92 years old may have a stronger reaction and need a smaller dose. Overdosage: If you think you have taken too much of this medicine contact a poison control center or emergency room at once. NOTE: This medicine is only for you. Do not share this medicine with others. What if I miss a dose? If you miss a dose, take it as soon as you can. If it is almost time for your next dose, take only that dose. Do not take double or extra doses. What may interact with this medication? Do not take this medication with any of the following: MAOIs like Carbex, Eldepryl, Marplan, Nardil, and Parnate This medication may also interact with the following: Alcohol Barbiturates, like  phenobarbital Medications for bladder spasm like oxybutynin, tolterodine Medications for blood pressure Medications for depression, anxiety, or psychotic disturbances Medications for movement abnormalities or Parkinson disease Medications for sleep Other medications for cold, cough or allergy Some medications for the stomach like chlordiazepoxide, dicyclomine This list may not describe all possible interactions. Give your health care provider a list of all the medicines, herbs, non-prescription drugs, or dietary supplements you use. Also tell them if you smoke, drink alcohol, or use illegal drugs. Some items may interact with your medicine. What should I watch for while using this medication? Your condition will be monitored carefully while you are receiving this medication. Tell your care team if your symptoms do not start to get better or if they get worse. You may get drowsy or dizzy. Do not drive, use machinery, or do anything that needs mental alertness until you know how this medication affects you. Do not stand or sit up quickly, especially if you are an older patient. This reduces the risk of dizzy or fainting spells. Alcohol may interfere with the effect of this medication. Avoid alcoholic drinks. Your mouth may get dry. Chewing sugarless gum or sucking hard candy, and drinking plenty of water may help. Contact your care team if the problem does not go away or is severe. What side effects may I notice from receiving this medication? Side effects that  you should report to your care team as soon as possible: Allergic reactions-skin rash, itching, hives, swelling of the face, lips, tongue, or throat Sudden eye pain or change in vision such as blurry vision, seeing halos around lights, vision loss Trouble passing urine Side effects that usually do not require medical attention (report to your care team if they continue or are bothersome): Constipation Drowsiness Dry mouth Headache Upset  stomach This list may not describe all possible side effects. Call your doctor for medical advice about side effects. You may report side effects to FDA at 1-800-FDA-1088. Where should I keep my medication? Keep out of the reach of children and pets. If you are using this medication at home, you will be instructed on how to store this medication. Throw away any unused medication after the expiration date on the label. NOTE: This sheet is a summary. It may not cover all possible information. If you have questions about this medicine, talk to your doctor, pharmacist, or health care provider.  2022 Elsevier/Gold Standard (2020-08-28 11:52:56) Drug Allergy A drug allergy happens when the body's disease-fighting system (immune system) reacts badly to a medicine. Drug allergies range from mild to severe. Some allergic reactions occur one week or more after you are exposed to a medicine (delayed reaction). A sudden (acute), severe allergic reaction that affects multiple areas of the body is called an anaphylactic reaction (anaphylaxis). Anaphylaxis can be life-threatening. All allergic reactions to a medicine require medical evaluation, even if the allergic reaction appears to be mild. What are the causes? This condition is caused by the immune system wrongly identifying a medicine as being harmful. When this happens, the body releases proteins (antibodies) and other compounds, such as histamine, into the bloodstream. This causes swelling in certain tissues and reduces blood flow to important areas, such as the heart and lungs. Almost any medicine can cause an allergic reaction. Medicines that commonly cause allergic reactions (are common allergens) include: Penicillin. Sulfa medicines (sulfonamides). Medicines that numb certain areas of the body (local anesthetics). X-ray dyes that contain iodine. What are the signs or symptoms? Common symptoms of a mild allergic reaction include: Nasal  congestion. Tingling in the mouth. An itchy, red rash. Common symptoms of a severe allergic reaction include: Swelling of the eyes, lips, face, or tongue. Swelling of the back of the mouth and the throat. Wheezing. A hoarse voice. Itchy, red, swollen areas of skin (hives). Dizziness or light-headedness. Fainting. Anxiety or confusion. Abdominal pain. Difficulty breathing, speaking, or swallowing. Chest tightness. Fast or irregular heartbeats (palpitations). Vomiting. Diarrhea. How is this diagnosed? This condition is diagnosed based on a physical exam and your history of recent exposure to one or more medicines. You may be referred for follow-up testing by a health care provider who specializes in allergies. This testing can confirm the diagnosis of a drug allergy and determine which medicines you are allergic to. Testing may include: Skin tests. These may involve: Injecting a small amount of the possible allergen between layers of your skin (intradermal injection). Applying patches to your skin. Blood tests. Drug challenge. For this test, a health care provider gives you a small amount of a medicine in gradual doses while watching for an allergic reaction. If you are unsure of what caused your allergic reaction, your health care provider may ask you for: Information about all medicines that you take on a regular basis. The date and time of your reaction. How is this treated? There is no cure for allergies. However,  an allergic reaction can be treated with: Medicines that help: Reduce pain and swelling (NSAIDs). Relieve itching and hives (antihistamines). Reduce swelling (corticosteroids). Respiratory inhalers. These are inhaled medicines that help open (dilate) the airways in your lungs. Injections of medicine that helps to relax the muscles in your airways and tighten your blood vessels (epinephrine). Severe allergic reactions, such as anaphylaxis, require immediate treatment in  a hospital. You may need to be hospitalized for observation. You may also be prescribed rescue medicines, such as epinephrine. Epinephrine comes in many forms, including what is commonly called an auto-injector "pen" (pre-filled automatic epinephrine injection device). Follow these instructions at home: If you have a severe allergy  Always keep an auto-injector pen or your anaphylaxis kit near you. These can be lifesaving if you have a severe reaction. Use your auto-injector pen or anaphylaxis kit as told by your health care provider. Make sure that you, the members of your household, and your employer know: How to use an anaphylaxis kit. How to use an auto-injector pen to give you an epinephrine injection. Replace your epinephrine immediately after you use your auto-injector pen, in case you have another reaction. Wear a medical alert bracelet or necklace that states your drug allergy, if told by your health care provider. General instructions Avoid medicines that you are allergic to. Take over-the-counter and prescription medicines only as told by your health care provider. If you were given medicines to treat your reaction, do not drive until your health care provider approves. If you have hives or a rash: Use an over-the-counter antihistamine as told by your health care provider. Apply cold, wet cloths (cold compresses) to your skin or take baths or showers in cool water. Avoid hot water. If you had tests done, it is up to you to get your test results. Ask your health care provider when your results will be ready. Tell any health care providers who care for you that you have a drug allergy. Keep all follow-up visits as told by your health care provider. This is important. Contact a health care provider if: You think that you are having a mild allergic reaction. Symptoms of an allergic reaction usually start within 30 minutes after you are exposed to a medicine. You have symptoms that last  more than 2 days after your reaction. Your symptoms get worse. You develop new symptoms. Get help right away if: You needed to use epinephrine. An epinephrine injection helps to manage life-threatening allergic reactions, but you still need to go to the emergency room even if epinephrine seems to work. This is important because anaphylaxis may happen again within 72 hours (rebound anaphylaxis). If you used epinephrine to treat anaphylaxis outside of the hospital, you need additional medical care. This may include more doses of epinephrine. You develop symptoms of a severe allergic reaction. These symptoms may represent a serious problem that is an emergency. Do not wait to see if the symptoms will go away. Use your auto-injector pen or anaphylaxis kit as you have been instructed, and get medical help right away. Call your local emergency services (911 in the [place] Do not drive yourself to the hospital. Summary A drug allergy happens when the body's disease-fighting system reacts badly to a medicine. Drug allergies range from mild to severe. In some cases, an allergic reaction may be life-threatening. If you have a severe allergy, always keep an auto-injector pen or your anaphylaxis kit near you. This information is not intended to replace advice given to you by  your health care provider. Make sure you discuss any questions you have with your health care provider. Document Revised: 10/09/2020 Document Reviewed: 02/14/2018 Elsevier Patient Education  [employer].

## 2021-05-14 NOTE — Progress Notes (Signed)
Patient was discharged from the clinic after receiving Venofer in stable condition. She returned 30 minutes later concerned that she was having a reaction. She presented very flushed and complained of tingling in her hands and feet. She does not complain of respiratory concerns. She does feel very "jittery". IV was started and she was given Benadryl 50 mg and Pepcid 20 mg. She was feeling some improvement after the benadryl. Her husband is coming to drive her home as she is quite drowsy from the benadryl.

## 2021-05-14 NOTE — Telephone Encounter (Signed)
Patient rescheduled 10/7, 10/14 Infusion Appt Times

## 2021-05-14 NOTE — Progress Notes (Signed)
Discharged home, redness and rash resolved at present, no shortness of breath- Husband here to drive patient home.Patient states understanding to go to ED for any worsening of symptoms.

## 2021-05-14 NOTE — Progress Notes (Signed)
She is discharged home with her husband in stable condition. She knows to use Benadryl later this evening if needed or if symptoms worsen, she is to report to the ED. Both the patient and her husband verbalize understanding.

## 2021-05-14 NOTE — Patient Instructions (Signed)

## 2021-05-15 ENCOUNTER — Encounter: Payer: Self-pay | Admitting: Oncology

## 2021-05-15 ENCOUNTER — Encounter: Payer: Self-pay | Admitting: Pharmacist

## 2021-05-15 NOTE — Addendum Note (Signed)
Addended by: Domenic Schwab on: 05/15/2021 08:11 PM   Modules accepted: Orders

## 2021-05-20 ENCOUNTER — Other Ambulatory Visit: Payer: Self-pay | Admitting: Pharmacist

## 2021-05-20 MED FILL — Famotidine in NaCl 0.9% IV Soln 20 MG/50ML: INTRAVENOUS | Qty: 100 | Status: AC

## 2021-05-20 MED FILL — Iron Sucrose Inj 20 MG/ML (Fe Equiv): INTRAVENOUS | Qty: 10 | Status: AC

## 2021-05-21 ENCOUNTER — Ambulatory Visit: Payer: BC Managed Care – PPO

## 2021-05-21 ENCOUNTER — Inpatient Hospital Stay: Payer: BC Managed Care – PPO | Attending: Oncology

## 2021-05-21 ENCOUNTER — Other Ambulatory Visit: Payer: Self-pay

## 2021-05-21 VITALS — BP 110/75 | HR 60 | Temp 99.0°F | Resp 18 | Ht 63.75 in | Wt 129.0 lb

## 2021-05-21 DIAGNOSIS — Z79899 Other long term (current) drug therapy: Secondary | ICD-10-CM | POA: Insufficient documentation

## 2021-05-21 DIAGNOSIS — D500 Iron deficiency anemia secondary to blood loss (chronic): Secondary | ICD-10-CM | POA: Diagnosis not present

## 2021-05-21 MED ORDER — ACETAMINOPHEN 325 MG PO TABS
650.0000 mg | ORAL_TABLET | Freq: Once | ORAL | Status: AC
  Administered 2021-05-21: 650 mg via ORAL
  Filled 2021-05-21: qty 2

## 2021-05-21 MED ORDER — SODIUM CHLORIDE 0.9 % IV SOLN
200.0000 mg | Freq: Once | INTRAVENOUS | Status: AC
  Administered 2021-05-21: 200 mg via INTRAVENOUS
  Filled 2021-05-21: qty 200

## 2021-05-21 MED ORDER — DIPHENHYDRAMINE HCL 50 MG/ML IJ SOLN
50.0000 mg | Freq: Once | INTRAMUSCULAR | Status: AC
  Administered 2021-05-21: 50 mg via INTRAVENOUS
  Filled 2021-05-21: qty 1

## 2021-05-21 MED ORDER — SODIUM CHLORIDE 0.9 % IV SOLN: Freq: Once | INTRAVENOUS | Status: AC

## 2021-05-21 MED ORDER — FAMOTIDINE 20 MG IN NS 100 ML IVPB
20.0000 mg | Freq: Once | INTRAVENOUS | Status: AC
  Administered 2021-05-21: 20 mg via INTRAVENOUS
  Filled 2021-05-21: qty 100

## 2021-05-21 NOTE — Patient Instructions (Signed)

## 2021-05-24 ENCOUNTER — Telehealth: Payer: Self-pay

## 2021-05-24 NOTE — Telephone Encounter (Signed)
1116 pt stated she done well with her iron treatment this time except for some diarrhea.

## 2021-05-27 ENCOUNTER — Other Ambulatory Visit: Payer: Self-pay | Admitting: Pharmacist

## 2021-05-27 MED FILL — Iron Sucrose Inj 20 MG/ML (Fe Equiv): INTRAVENOUS | Qty: 10 | Status: AC

## 2021-05-28 ENCOUNTER — Encounter: Payer: Self-pay | Admitting: Oncology

## 2021-05-28 ENCOUNTER — Inpatient Hospital Stay: Payer: BC Managed Care – PPO

## 2021-05-28 ENCOUNTER — Other Ambulatory Visit: Payer: Self-pay | Admitting: *Deleted

## 2021-05-28 ENCOUNTER — Ambulatory Visit: Payer: BC Managed Care – PPO

## 2021-05-28 ENCOUNTER — Other Ambulatory Visit: Payer: Self-pay

## 2021-05-28 VITALS — BP 118/78 | HR 66 | Temp 98.2°F | Resp 18 | Ht 63.75 in | Wt 128.0 lb

## 2021-05-28 DIAGNOSIS — Z79899 Other long term (current) drug therapy: Secondary | ICD-10-CM | POA: Diagnosis not present

## 2021-05-28 DIAGNOSIS — D500 Iron deficiency anemia secondary to blood loss (chronic): Secondary | ICD-10-CM

## 2021-05-28 MED ORDER — SODIUM CHLORIDE 0.9 % IV SOLN: Freq: Once | INTRAVENOUS | Status: AC

## 2021-05-28 MED ORDER — ACETAMINOPHEN 325 MG PO TABS
650.0000 mg | ORAL_TABLET | Freq: Once | ORAL | Status: AC
  Administered 2021-05-28: 650 mg via ORAL
  Filled 2021-05-28: qty 2

## 2021-05-28 MED ORDER — FAMOTIDINE 20 MG IN NS 100 ML IVPB
20.0000 mg | INTRAVENOUS | Status: AC
  Administered 2021-05-28 (×2): 20 mg via INTRAVENOUS
  Filled 2021-05-28: qty 100

## 2021-05-28 MED ORDER — IRON SUCROSE 20 MG/ML IV SOLN
200.0000 mg | Freq: Once | INTRAVENOUS | Status: AC
  Administered 2021-05-28: 200 mg via INTRAVENOUS
  Filled 2021-05-28: qty 200

## 2021-05-28 MED ORDER — VALACYCLOVIR HCL 500 MG PO TABS: ORAL_TABLET | ORAL | 3 refills | Status: DC

## 2021-05-28 MED ORDER — DIPHENHYDRAMINE HCL 50 MG/ML IJ SOLN
50.0000 mg | Freq: Once | INTRAMUSCULAR | Status: AC
  Administered 2021-05-28: 50 mg via INTRAVENOUS
  Filled 2021-05-28: qty 1

## 2021-05-28 MED ORDER — FAMOTIDINE IN NACL 20-0.9 MG/50ML-% IV SOLN: 20.0000 mg | INTRAVENOUS | Status: DC

## 2021-05-28 NOTE — Telephone Encounter (Signed)
Refill request received from [facility] for Valtrex 500 mg tab.   Last RX sent on 03/15/21, #100/3RF  Call placed to [facility] [place], spoke with Destiney. Was advised no RX on file for valacyclovir (Valtrex) dated 03/15/21. No refills remaining.   Rx sent.   Routing to Dr. Marjorie Smolder.   Encounter closed.

## 2021-05-28 NOTE — Patient Instructions (Signed)

## 2021-06-04 ENCOUNTER — Ambulatory Visit: Payer: BC Managed Care – PPO

## 2021-06-04 ENCOUNTER — Other Ambulatory Visit: Payer: Self-pay | Admitting: Pharmacist

## 2021-06-04 MED FILL — Iron Sucrose Inj 20 MG/ML (Fe Equiv): INTRAVENOUS | Qty: 10 | Status: AC

## 2021-06-04 MED FILL — Famotidine Inj 200 MG/20ML: INTRAVENOUS | Qty: 4 | Status: AC

## 2021-06-07 ENCOUNTER — Inpatient Hospital Stay: Payer: BC Managed Care – PPO

## 2021-06-07 DIAGNOSIS — Z79899 Other long term (current) drug therapy: Secondary | ICD-10-CM | POA: Diagnosis not present

## 2021-06-07 DIAGNOSIS — D500 Iron deficiency anemia secondary to blood loss (chronic): Secondary | ICD-10-CM | POA: Diagnosis not present

## 2021-06-07 MED ORDER — DIPHENHYDRAMINE HCL 50 MG/ML IJ SOLN
50.0000 mg | Freq: Once | INTRAMUSCULAR | Status: AC
  Administered 2021-06-07: 50 mg via INTRAVENOUS
  Filled 2021-06-07: qty 1

## 2021-06-07 MED ORDER — SODIUM CHLORIDE 0.9 % IV SOLN
40.0000 mg | Freq: Once | INTRAVENOUS | Status: AC
  Administered 2021-06-07: 40 mg via INTRAVENOUS
  Filled 2021-06-07: qty 4

## 2021-06-07 MED ORDER — ACETAMINOPHEN 325 MG PO TABS
650.0000 mg | ORAL_TABLET | Freq: Once | ORAL | Status: AC
  Administered 2021-06-07: 650 mg via ORAL
  Filled 2021-06-07: qty 2

## 2021-06-07 MED ORDER — SODIUM CHLORIDE 0.9 % IV SOLN
200.0000 mg | Freq: Once | INTRAVENOUS | Status: AC
  Administered 2021-06-07: 200 mg via INTRAVENOUS
  Filled 2021-06-07: qty 200

## 2021-06-07 MED ORDER — SODIUM CHLORIDE 0.9 % IV SOLN: Freq: Once | INTRAVENOUS | Status: AC

## 2021-06-07 NOTE — Patient Instructions (Signed)

## 2021-07-23 ENCOUNTER — Ambulatory Visit: Payer: BC Managed Care – PPO | Admitting: Nurse Practitioner

## 2021-07-23 ENCOUNTER — Other Ambulatory Visit: Payer: Self-pay

## 2021-07-23 ENCOUNTER — Encounter: Payer: Self-pay | Admitting: Nurse Practitioner

## 2021-07-23 VITALS — BP 138/82 | HR 79 | Temp 97.2°F | Ht 63.0 in | Wt 127.0 lb

## 2021-07-23 DIAGNOSIS — R300 Dysuria: Secondary | ICD-10-CM

## 2021-07-23 DIAGNOSIS — M545 Low back pain, unspecified: Secondary | ICD-10-CM | POA: Diagnosis not present

## 2021-07-23 LAB — POCT URINALYSIS DIPSTICK
Bilirubin, UA: NEGATIVE
Blood, UA: NEGATIVE
Glucose, UA: NEGATIVE
Ketones, UA: NEGATIVE
Leukocytes, UA: NEGATIVE
Nitrite, UA: NEGATIVE
Protein, UA: NEGATIVE
Spec Grav, UA: 1.01 (ref 1.010–1.025)
Urobilinogen, UA: 0.2 E.U./dL
pH, UA: 6.5 (ref 5.0–8.0)

## 2021-07-23 MED ORDER — TRIAMCINOLONE ACETONIDE 40 MG/ML IJ SUSP
60.0000 mg | Freq: Once | INTRAMUSCULAR | Status: AC
  Administered 2021-07-23: 60 mg via INTRAMUSCULAR

## 2021-07-23 MED ORDER — CYCLOBENZAPRINE HCL 10 MG PO TABS: 10.0000 mg | ORAL_TABLET | Freq: Three times a day (TID) | ORAL | 0 refills | Status: DC | PRN

## 2021-07-23 MED ORDER — KETOROLAC TROMETHAMINE 60 MG/2ML IM SOLN
60.0000 mg | Freq: Once | INTRAMUSCULAR | Status: AC
  Administered 2021-07-23: 60 mg via INTRAMUSCULAR

## 2021-07-23 NOTE — Patient Instructions (Addendum)
Kenalog and Toradol injection given in office Alternate heat and ice to lower back Ibuprofen 800 mg as directed Obtain x-ray to lower back if symptoms fail to improve Perform back exercises daily as tolerated Follow-up with ob/gyn as needed Follow-up as needed  Acute Back Pain, Adult Acute back pain is sudden and usually short-lived. It is often caused by an injury to the muscles and tissues in the back. The injury may result from: A muscle, tendon, or ligament getting overstretched or torn. Ligaments are tissues that connect bones to each other. Lifting something improperly can cause a back strain. Wear and tear (degeneration) of the spinal disks. Spinal disks are circular tissue that provide cushioning between the bones of the spine (vertebrae). Twisting motions, such as while playing sports or doing yard work. A hit to the back. Arthritis. You may have a physical exam, lab tests, and imaging tests to find the cause of your pain. Acute back pain usually goes away with rest and home care. Follow these instructions at home: Managing pain, stiffness, and swelling Take over-the-counter and prescription medicines only as told by your health care provider. Treatment may include medicines for pain and inflammation that are taken by mouth or applied to the skin, or muscle relaxants. Your health care provider may recommend applying ice during the first 24-48 hours after your pain starts. To do this: Put ice in a plastic bag. Place a towel between your skin and the bag. Leave the ice on for 20 minutes, 2-3 times a day. Remove the ice if your skin turns bright red. This is very important. If you cannot feel pain, heat, or cold, you have a greater risk of damage to the area. If directed, apply heat to the affected area as often as told by your health care provider. Use the heat source that your health care provider recommends, such as a moist heat pack or a heating pad. Place a towel between your skin  and the heat source. Leave the heat on for 20-30 minutes. Remove the heat if your skin turns bright red. This is especially important if you are unable to feel pain, heat, or cold. You have a greater risk of getting burned. Activity  Do not stay in bed. Staying in bed for more than 1-2 days can delay your recovery. Sit up and stand up straight. Avoid leaning forward when you sit or hunching over when you stand. If you work at a desk, sit close to it so you do not need to lean over. Keep your chin tucked in. Keep your neck drawn back, and keep your elbows bent at a 90-degree angle (right angle). Sit high and close to the steering wheel when you drive. Add lower back (lumbar) support to your car seat, if needed. Take short walks on even surfaces as soon as you are able. Try to increase the length of time you walk each day. Do not sit, drive, or stand in one place for more than 30 minutes at a time. Sitting or standing for long periods of time can put stress on your back. Do not drive or use heavy machinery while taking prescription pain medicine. Use proper lifting techniques. When you bend and lift, use positions that put less stress on your back: [place] your knees. Keep the load close to your body. Avoid twisting. Exercise regularly as told by your health care provider. Exercising helps your back heal faster and helps prevent back injuries by keeping muscles strong and flexible. Work with  a physical therapist to make a safe exercise program, as recommended by your health care provider. Do any exercises as told by your physical therapist. Lifestyle Maintain a healthy weight. Extra weight puts stress on your back and makes it difficult to have good posture. Avoid activities or situations that make you feel anxious or stressed. Stress and anxiety increase muscle tension and can make back pain worse. Learn ways to manage anxiety and stress, such as through exercise. General instructions Sleep on a  firm mattress in a comfortable position. Try lying on your side with your knees slightly bent. If you lie on your back, put a pillow under your knees. Keep your head and neck in a straight line with your spine (neutral position) when using electronic equipment like smartphones or pads. To do this: Raise your smartphone or pad to look at it instead of bending your head or neck to look down. Put the smartphone or pad at the level of your face while looking at the screen. Follow your treatment plan as told by your health care provider. This may include: Cognitive or behavioral therapy. Acupuncture or massage therapy. Meditation or yoga. Contact a health care provider if: You have pain that is not relieved with rest or medicine. You have increasing pain going down into your legs or buttocks. Your pain does not improve after 2 weeks. You have pain at night. You lose weight without trying. You have a fever or chills. You develop nausea or vomiting. You develop abdominal pain. Get help right away if: You develop new bowel or bladder control problems. You have unusual weakness or numbness in your arms or legs. You feel faint. These symptoms may represent a serious problem that is an emergency. Do not wait to see if the symptoms will go away. Get medical help right away. Call your local emergency services (911 in the [place] Do not drive yourself to the hospital. Summary Acute back pain is sudden and usually short-lived. Use proper lifting techniques. When you bend and lift, use positions that put less stress on your back. Take over-the-counter and prescription medicines only as told by your health care provider, and apply heat or ice as told. This information is not intended to replace advice given to you by your health care provider. Make sure you discuss any questions you have with your health care provider. Document Revised: 10/23/2020 Document Reviewed: 10/23/2020 Elsevier Patient Education   2022 [employer] Back Exercises These exercises help to make your trunk and back strong. They also help to keep the lower back flexible. Doing these exercises can help to prevent or lessen pain in your lower back. If you have back pain, try to do these exercises 2-3 times each day or as told by your doctor. As you get better, do the exercises once each day. Repeat the exercises more often as told by your doctor. To stop back pain from coming back, do the exercises once each day, or as told by your doctor. Do exercises exactly as told by your doctor. Stop right away if you feel sudden pain or your pain gets worse. Exercises Single knee to chest Do these steps 3-5 times in a row for each leg: Lie on your back on a firm bed or the floor with your legs stretched out. Bring one knee to your chest. Grab your knee or thigh with both hands and hold it in place. Pull on your knee until you feel a gentle stretch in your lower back or  butt. Keep doing the stretch for 10-30 seconds. Slowly let go of your leg and straighten it. Pelvic tilt Do these steps 5-10 times in a row: Lie on your back on a firm bed or the floor with your legs stretched out. Bend your knees so they point up to the ceiling. Your feet should be flat on the floor. Tighten your lower belly (abdomen) muscles to press your lower back against the floor. This will make your tailbone point up to the ceiling instead of pointing down to your feet or the floor. Stay in this position for 5-10 seconds while you gently tighten your muscles and breathe evenly. Cat-cow Do these steps until your lower back bends more easily: Get on your hands and knees on a firm bed or the floor. Keep your hands under your shoulders, and keep your knees under your hips. You may put padding under your knees. Let your head hang down toward your chest. Tighten (contract) the muscles in your belly. Point your tailbone toward the floor so your lower back becomes  rounded like the back of a cat. Stay in this position for 5 seconds. Slowly lift your head. Let the muscles of your belly relax. Point your tailbone up toward the ceiling so your back forms a sagging arch like the back of a cow. Stay in this position for 5 seconds.  Press-ups Do these steps 5-10 times in a row: Lie on your belly (face-down) on a firm bed or the floor. Place your hands near your head, about shoulder-width apart. While you keep your back relaxed and keep your hips on the floor, slowly straighten your arms to raise the top half of your body and lift your shoulders. Do not use your back muscles. You may change where you place your hands to make yourself more comfortable. Stay in this position for 5 seconds. Keep your back relaxed. Slowly return to lying flat on the floor.  Bridges Do these steps 10 times in a row: Lie on your back on a firm bed or the floor. Bend your knees so they point up to the ceiling. Your feet should be flat on the floor. Your arms should be flat at your sides, next to your body. Tighten your butt muscles and lift your butt off the floor until your waist is almost as high as your knees. If you do not feel the muscles working in your butt and the back of your thighs, slide your feet 1-2 inches (2.5-5 cm) farther away from your butt. Stay in this position for 3-5 seconds. Slowly lower your butt to the floor, and let your butt muscles relax. If this exercise is too easy, try doing it with your arms crossed over your chest. Belly crunches Do these steps 5-10 times in a row: Lie on your back on a firm bed or the floor with your legs stretched out. Bend your knees so they point up to the ceiling. Your feet should be flat on the floor. Cross your arms over your chest. Tip your chin a little bit toward your chest, but do not bend your neck. Tighten your belly muscles and slowly raise your chest just enough to lift your shoulder blades a tiny bit off the floor.  Avoid raising your body higher than that because it can put too much stress on your lower back. Slowly lower your chest and your head to the floor. Back lifts Do these steps 5-10 times in a row: Lie on your belly (face-down)  with your arms at your sides, and rest your forehead on the floor. Tighten the muscles in your legs and your butt. Slowly lift your chest off the floor while you keep your hips on the floor. Keep the back of your head in line with the curve in your back. Look at the floor while you do this. Stay in this position for 3-5 seconds. Slowly lower your chest and your face to the floor. Contact a doctor if: Your back pain gets a lot worse when you do an exercise. Your back pain does not get better within 2 hours after you exercise. If you have any of these problems, stop doing the exercises. Do not do them again unless your doctor says it is okay. Get help right away if: You have sudden, very bad back pain. If this happens, stop doing the exercises. Do not do them again unless your doctor says it is okay. This information is not intended to replace advice given to you by your health care provider. Make sure you discuss any questions you have with your health care provider. Document Revised: 10/14/2020 Document Reviewed: 10/14/2020 Elsevier Patient Education  2022 [employer].

## 2021-07-23 NOTE — Progress Notes (Signed)
Acute Office Visit  Subjective:    Patient ID: Ann Whitehead, female    DOB: 06/03/1981, 40 y.o.   MRN: [id number]  CC: Lower back pain   HPI: Patient is in today for bilateral lower back pain. She tells me she has a history of chronic ovarian cysts, dysmenorrhea, and menorrhagia. She recently required an iron infusion due to anemia associated with heavy vaginal bleeding. She is followed by Dr Ricki Miller, ob/gyn. States she has a history of endometriosis. She is not currently taking oral contraceptives. Had tubal ligation. States she recently was married 63-months ago. In addition to back pain, states she has experienced urinary frequency and discomfort post-voiding.   Back Pain  She reports new onset back pain. There was not an injury that may have caused the pain. 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 The pain is located in the right lumbar area or left lumbar area radiating to anterior abdomen/pelvic area. It is described as aching and throbbing, is 6/10 in intensity, occurring intermittently.   Aggravating factors: none Relieving factors: none.  She has tried application of heat and NSAIDs with little relief.   Associated symptoms: Yes abdominal pain No bowel incontinence  No chest pain Yes dysuria, after voiding  No fever No headaches  No joint pains Yes pelvic pain  No weakness in leg  No tingling in lower extremities  No urinary incontinence No weight loss       Past Medical History:  Diagnosis Date   Abnormal uterine bleeding (AUB)    Acute hearing loss    Right   Anemia    Dizziness    Elevated AST (SGOT) 10/2016   Endometriosis    Left ovarian cyst    Low ferritin    Migraines    without aura   STD (sexually transmitted disease)    HX HSV   Tinnitus, right ear     Past  Surgical History:  Procedure Laterality Date   ABLATION ON ENDOMETRIOSIS N/A 07/21/2015   Procedure: FULGERATION OF ENDOMETRIOSIS, BIOPSY OF POSTERIOR CUL DE SAC;  Surgeon: Patton Salles, MD;  Location: [facility];  Service: Gynecology;  Laterality: N/A;   APPENDECTOMY  2004   AUGMENTATION MAMMAPLASTY  2011   CESAREAN SECTION  2003   CHOLECYSTECTOMY  2004   LAPAROSCOPIC TUBAL LIGATION Right 07/21/2015   Procedure: LAPAROSCOPIC TUBAL LIGATION;  Surgeon: Patton Salles, MD;  Location: [facility];  Service: Gynecology;  Laterality: Right;   OVARIAN CYST REMOVAL Left 07/21/2015   Procedure: EXCISION PARA-OVARIAN CYST;  Surgeon: Patton Salles, MD;  Location: [facility];  Service: Gynecology;  Laterality: Left;   UNILATERAL SALPINGECTOMY Left 07/21/2015   Procedure: DISTAL UNILATERAL SALPINGECTOMY;  Surgeon: Patton Salles, MD;  Location: [facility];  Service: Gynecology;  Laterality: Left;    Family History  Problem Relation Age of Onset   Colon polyps Mother        multiple pre-cancerous polyps on several colonoscopies   Diabetes Father    Hypertension Father    Hyperlipidemia Father    Diabetes Paternal Grandmother    Hyperlipidemia Paternal Grandmother    Hypertension Paternal Grandmother     Social History   Socioeconomic History   Marital status: Married    Spouse name: Lorin Picket  Number of children: 1   Years of education: 12 + 2   Highest education level: Not on file  Occupational History   Occupation: ACCESS D[employer]  Tobacco Use   Smoking status: Light Smoker    Types: Cigarettes   Smokeless tobacco: Never   Tobacco comments:    smokes 5 cigs/week  Vaping Use   Vaping Use: Never used  Substance and Sexual Activity   Alcohol use: Not Currently    Comment: 3 beers/month   Drug use: No   Sexual activity: Yes    Partners: Male    Birth control/protection: Surgical    Comment: Tubal  Other Topics Concern   Not on file  Social History  Narrative   Not on file   Social Determinants of Health   Financial Resource Strain: Not on file  Food Insecurity: Not on file  Transportation Needs: Not on file  Physical Activity: Not on file  Stress: Not on file  Social Connections: Not on file  Intimate Partner Violence: Not on file    Outpatient Medications Prior to Visit  Medication Sig Dispense Refill   cetirizine (ZYRTEC) 10 MG tablet Take 10 mg by mouth daily.     Cholecalciferol (VITAMIN D-3 PO) Take 1 tablet by mouth daily.     ELDERBERRY PO Take 1 tablet by mouth daily.     EPINEPHrine 0.3 mg/0.3 mL IJ SOAJ injection Inject 0.3 mg into the muscle as needed for anaphylaxis. 1 each 3   estradiol (ESTRACE) 0.1 MG/GM vaginal cream Use 1/2 g vaginally two or three times per week as needed to maintain symptom relief. 42.5 g 2   ibuprofen (ADVIL,MOTRIN) 200 MG tablet Take 600-800 mg by mouth every 8 (eight) hours as needed.      loratadine (CLARITIN) 10 MG tablet Take 10 mg by mouth daily.     Multiple Vitamins-Minerals (MULTI ADULT GUMMIES) CHEW      norethindrone (MICRONOR) 0.35 MG tablet Take 1 tablet (0.35 mg total) by mouth daily. 84 tablet 3   valACYclovir (VALTREX) 500 MG tablet Take one tablet by mouth daily for prevention of infection.  Take one tablet twice a day for 3 days as needed for an outbreak. 100 tablet 3   zinc gluconate 50 MG tablet Take 50 mg by mouth daily.     No facility-administered medications prior to visit.    Allergies  Allergen Reactions   Venofer [Iron Sucrose] Hives    Patient was discharged from the clinic after receiving Venofer in stable condition. She returned 30 minutes later concerned that she was having a reaction. She presented very flushed and complained of tingling in her hands and feet. She does not complain of respiratory concerns. She does feel very "jittery". IV was started and she was given Benadryl 50 mg and Pepcid 20 mg. She was feeling some improvement after the benadryl.     Review of Systems  Constitutional:  Negative for chills and fever.  HENT:  Negative for congestion and sore throat.   Respiratory:  Negative for cough and shortness of breath.   Cardiovascular:  Negative for chest pain.  Gastrointestinal:  Positive for abdominal pain and nausea.  Endocrine: Positive for polyuria.  Genitourinary:  Positive for dysuria and frequency. Negative for flank pain and urgency.  Musculoskeletal:  Positive for back pain (lower).      Objective:    Physical Exam Vitals reviewed.  Constitutional:      Appearance: Normal appearance.  HENT:  Head: Normocephalic.     Right Ear: Tympanic membrane normal.     Left Ear: Tympanic membrane normal.     Nose: Nose normal.     Mouth/Throat:     Mouth: Mucous membranes are moist.  Eyes:     Pupils: Pupils are equal, round, and reactive to light.  Cardiovascular:     Rate and Rhythm: Normal rate and regular rhythm.     Pulses: Normal pulses.     Heart sounds: Normal heart sounds.  Pulmonary:     Effort: Pulmonary effort is normal.     Breath sounds: Normal breath sounds.  Abdominal:     General: Bowel sounds are normal.     Palpations: Abdomen is soft.  Musculoskeletal:        General: Normal range of motion.     Cervical back: Neck supple.     Lumbar back: Tenderness present. No spasms. Normal range of motion. Negative right straight leg raise test and negative left straight leg raise test.  Skin:    General: Skin is warm and dry.     Capillary Refill: Capillary refill takes less than 2 seconds.  Neurological:     General: No focal deficit present.     Mental Status: She is alert and oriented to person, place, and time.  Psychiatric:        Mood and Affect: Mood normal.        Behavior: Behavior normal.   BP 138/82   Pulse 79   Temp (!) 97.2 F (36.2 C)   Ht 5\' 3"  (1.6 m)   Wt 127 lb (57.6 kg)   SpO2 100%   BMI 22.50 kg/m   Wt 127 lb (57.6 kg)   BMI 21.97 kg/m  Wt Readings from Last 3  Encounters:  07/23/21 127 lb (57.6 kg)  05/28/21 128 lb (58.1 kg)  05/21/21 129 lb 0.6 oz (58.5 kg)    Health Maintenance Due  Topic Date Due   Pneumococcal Vaccine 44-40 Years old (1 - PCV) Never done   COVID-19 Vaccine (3 - Booster for Pfizer series) 06/20/2020   INFLUENZA VACCINE  03/15/2021       Lab Results  Component Value Date   TSH 1.78 03/15/2021   Lab Results  Component Value Date   WBC 7.4 05/05/2021   HGB 12.3 05/05/2021   HCT 37 05/05/2021   MCV 98.2 03/15/2021   PLT 321 05/05/2021   Lab Results  Component Value Date   NA 140 05/05/2021   K 3.4 05/05/2021   CO2 28 (A) 05/05/2021   GLUCOSE 83 03/15/2021   BUN 14 05/05/2021   CREATININE 0.9 05/05/2021   BILITOT 0.4 03/15/2021   ALKPHOS 40 05/05/2021   AST 33 05/05/2021   ALT 24 05/05/2021   PROT 7.4 03/15/2021   ALBUMIN 4.7 05/05/2021   CALCIUM 10.1 05/05/2021   Lab Results  Component Value Date   CHOL 190 03/15/2021   Lab Results  Component Value Date   HDL 70 03/15/2021   Lab Results  Component Value Date   LDLCALC 106 (H) 03/15/2021   Lab Results  Component Value Date   TRIG 62 03/15/2021   Lab Results  Component Value Date   CHOLHDL 2.7 03/15/2021        Assessment & Plan:   1. Lumbar pain - cyclobenzaprine (FLEXERIL) 10 MG tablet; Take 1 tablet (10 mg total) by mouth 3 (three) times daily as needed for muscle spasms.  Dispense: 30 tablet; Refill:  0 - ketorolac (TORADOL) injection 60 mg - triamcinolone acetonide (KENALOG-40) injection 60 mg  2. Dysuria - POCT urinalysis dipstick - Urine Culture - cyclobenzaprine (FLEXERIL) 10 MG tablet; Take 1 tablet (10 mg total) by mouth 3 (three) times daily as needed for muscle spasms.  Dispense: 30 tablet; Refill: 0   Kenalog and Toradol injection given in office Alternate heat and ice to lower back Ibuprofen 800 mg as directed Obtain x-ray to lower back if symptoms fail to improve Perform back exercises daily as  tolerated Follow-up with ob/gyn as needed Follow-up as needed    Follow-up: PRN  An After Visit Summary was printed and given to the patient.  I, Janie Morning, NP, have reviewed all documentation for this visit. The documentation on 07/23/21 for the exam, diagnosis, procedures, and orders are all accurate and complete.    Signed, Janie Morning, NP [facility] [phone]

## 2021-08-29 DIAGNOSIS — J101 Influenza due to other identified influenza virus with other respiratory manifestations: Secondary | ICD-10-CM | POA: Diagnosis not present

## 2021-09-13 NOTE — Progress Notes (Signed)
[facility] [place]  [address] [place],  [place]  [address] [phone]  Clinic Day:  09/17/2021  Referring physician: Marge Duncans, PA-C  This document serves as a record of services personally performed by Marice Potter, MD. It was created on their behalf by [facility], a trained medical scribe. The creation of this record is based on the scribe's personal observations and the provider's statements to them.  HISTORY OF PRESENT ILLNESS:  The patient is a 41 y.o. female  who I recently began seeing for a low ferrtin.  Based upon this, the patient recently received IV iron to replenish her iron stores and improve her hemoglobin.  She comes in today to reassess these levels.  Overall, the patient claims to be doing fairly well.  She claims her menstrual cycles can still be relatively heavy.  It is to the point where she is contemplating undergoing a partial hysterectomy.  She denies having other overt forms of blood loss to explain her iron deficiency anemia.    PHYSICAL EXAM:  Blood pressure (!) 134/92, pulse 65, temperature 98.6 F (37 C), resp. rate 14, height 5\' 3"  (1.6 m), weight 127 lb 6.4 oz (57.8 kg), SpO2 95 %. Wt Readings from Last 3 Encounters:  09/17/21 127 lb 6.4 oz (57.8 kg)  07/23/21 127 lb (57.6 kg)  05/28/21 128 lb (58.1 kg)   Body mass index is 22.57 kg/m. Performance status (ECOG): 0 - Asymptomatic Physical Exam Constitutional:      Appearance: Normal appearance. She is not ill-appearing.  HENT:     Mouth/Throat:     Mouth: Mucous membranes are moist.     Pharynx: Oropharynx is clear. No oropharyngeal exudate or posterior oropharyngeal erythema.  Cardiovascular:     Rate and Rhythm: Normal rate and regular rhythm.     Heart sounds: No murmur heard.   No friction rub. No gallop.  Pulmonary:     Effort: Pulmonary effort is normal. No respiratory distress.     Breath sounds: Normal breath sounds. No wheezing, rhonchi or rales.   Abdominal:     General: Bowel sounds are normal. There is no distension.     Palpations: Abdomen is soft. There is no mass.     Tenderness: There is no abdominal tenderness.  Musculoskeletal:        General: No swelling.     Right lower leg: No edema.     Left lower leg: No edema.  Lymphadenopathy:     Cervical: No cervical adenopathy.     Upper Body:     Right upper body: No supraclavicular or axillary adenopathy.     Left upper body: No supraclavicular or axillary adenopathy.     Lower Body: No right inguinal adenopathy. No left inguinal adenopathy.  Skin:    General: Skin is warm.     Coloration: Skin is not jaundiced.     Findings: No lesion or rash.  Neurological:     General: No focal deficit present.     Mental Status: She is alert and oriented to person, place, and time. Mental status is at baseline.  Psychiatric:        Mood and Affect: Mood normal.        Behavior: Behavior normal.        Thought Content: Thought content normal.   LABS:    Latest Reference Range & Units 09/17/21 00:00  WBC  6.9  RBC 3.87 - 5.11  3.78 !  Hemoglobin  12.0 - 16.0  12.7  HCT 36 - 46  38  MCV  101 !  Platelets 150 - 399  302  NEUT#  4.00    Latest Reference Range & Units 05/05/21 10:16 09/17/21 09:59  Iron 28 - 170 ug/dL 138 130  UIBC ug/dL 358 249  TIBC 250 - 450 ug/dL 496 (H) 379  Saturation Ratios 10.4 - 31.8 % 28 34 (H)  Ferritin 11 - 307 ng/mL 7 (L) 76  (H): Data is abnormally high (L): Data is abnormally low   ASSESSMENT & PLAN:  A 41 y.o. female with a normal hemoglobin, but with a low ferritin level.  Her hemoglobin of 12.7 is better than what it was since her IV iron was given.  Her iron parameters also show a much improved ferritin level, which is indicative of better iron stores.  Clinically, the patient is doing well.  I will see her back in 6 months to reassess her iron and hemoglobin levels.  The patient understands all the plans discussed today and is in agreement  with them.   I, Rita Ohara, am acting as scribe for Marice Potter, MD    I have reviewed this report as typed by the medical scribe, and it is complete and accurate.  Dequincy Macarthur Critchley, MD

## 2021-09-17 ENCOUNTER — Inpatient Hospital Stay: Payer: BC Managed Care – PPO | Attending: Oncology | Admitting: Oncology

## 2021-09-17 ENCOUNTER — Encounter: Payer: Self-pay | Admitting: Oncology

## 2021-09-17 ENCOUNTER — Other Ambulatory Visit: Payer: Self-pay | Admitting: Oncology

## 2021-09-17 ENCOUNTER — Inpatient Hospital Stay: Payer: BC Managed Care – PPO

## 2021-09-17 VITALS — BP 134/92 | HR 65 | Temp 98.6°F | Resp 14 | Ht 63.0 in | Wt 127.4 lb

## 2021-09-17 DIAGNOSIS — R790 Abnormal level of blood mineral: Secondary | ICD-10-CM | POA: Diagnosis not present

## 2021-09-17 DIAGNOSIS — D649 Anemia, unspecified: Secondary | ICD-10-CM

## 2021-09-17 DIAGNOSIS — D500 Iron deficiency anemia secondary to blood loss (chronic): Secondary | ICD-10-CM | POA: Diagnosis not present

## 2021-09-17 LAB — CBC AND DIFFERENTIAL
HCT: 38 (ref 36–46)
Hemoglobin: 12.7 (ref 12.0–16.0)
Neutrophils Absolute: 4
Platelets: 302 (ref 150–399)
WBC: 6.9

## 2021-09-17 LAB — IRON AND TIBC
Iron: 130 ug/dL (ref 28–170)
Saturation Ratios: 34 % — ABNORMAL HIGH (ref 10.4–31.8)
TIBC: 379 ug/dL (ref 250–450)
UIBC: 249 ug/dL

## 2021-09-17 LAB — FERRITIN: Ferritin: 76 ng/mL (ref 11–307)

## 2021-09-17 LAB — CBC: RBC: 3.78 — AB (ref 3.87–5.11)

## 2021-09-19 ENCOUNTER — Encounter: Payer: Self-pay | Admitting: Oncology

## 2021-09-27 ENCOUNTER — Encounter: Payer: Self-pay | Admitting: Oncology

## 2021-10-21 ENCOUNTER — Other Ambulatory Visit: Payer: Self-pay

## 2021-10-21 NOTE — Telephone Encounter (Signed)
Last AEX 03/15/21. ?Next AEX scheduled 03/17/22. ? ?Last Rx sent on 05/28/21 for Valtrex #100 with 3RF.  ? ?I called pt to confirm how she is taking the medication. However, no answer and advised her to call back.  ? ?I called [facility] and spoke with Clifton Custard. He advised that the request was faxed to [place] due to the patient requesting refill on an older Rx#. Will notify pt if/when she returns call.  ?

## 2021-10-21 NOTE — Telephone Encounter (Signed)
Spoke with patient. She said she never requested a refill and is aware Rx is there. Does not need it now. ?

## 2021-10-27 ENCOUNTER — Encounter: Payer: Self-pay | Admitting: Physician Assistant

## 2021-10-27 ENCOUNTER — Other Ambulatory Visit: Payer: Self-pay

## 2021-10-27 ENCOUNTER — Telehealth (INDEPENDENT_AMBULATORY_CARE_PROVIDER_SITE_OTHER): Payer: BC Managed Care – PPO | Admitting: Physician Assistant

## 2021-10-27 VITALS — Temp 98.4°F | Ht 63.0 in | Wt 128.0 lb

## 2021-10-27 DIAGNOSIS — J0110 Acute frontal sinusitis, unspecified: Secondary | ICD-10-CM | POA: Diagnosis not present

## 2021-10-27 MED ORDER — CEFDINIR 300 MG PO CAPS: 300.0000 mg | ORAL_CAPSULE | Freq: Two times a day (BID) | ORAL | 0 refills | Status: DC

## 2021-10-27 NOTE — Progress Notes (Signed)
? ?Virtual Visit via Telephone Note  ? ?This visit type was conducted due to national recommendations for restrictions regarding the COVID-19 Pandemic (e.g. social distancing) in an effort to limit this patient's exposure and mitigate transmission in our community.  Due to her co-morbid illnesses, this patient is at least at moderate risk for complications without adequate follow up.  This format is felt to be most appropriate for this patient at this time.  The patient did not have access to video technology/had technical difficulties with video requiring transitioning to audio format only (telephone).  All issues noted in this document were discussed and addressed.  No physical exam could be performed with this format.  Patient verbally consented to a telehealth visit.  ? ?Date:  10/27/2021  ? ?ID:  Ann Whitehead, DOB Oct 27, 1980, MRN [id number] ? ?Patient Location: Home ?Provider Location: Office ? ?PCP:  Marianne Sofia, PA-C  ? ? ?Chief Complaint:  sinus infection ? ?History of Present Illness:   ? ?Ann Whitehead is a 40 y.o. female with complaints of sinus pressure, pnd, and congestion for the past 3 days - denies sore throat or fever - has not been coughing ?Has used sudafed and claritin for symptoms ? ?The patient does not have symptoms concerning for COVID-19 infection (fever, chills, cough, or new shortness of breath).  ? ? ?Past Medical History:  ?Diagnosis Date  ? Abnormal uterine bleeding (AUB)   ? Acute hearing loss   ? Right  ? Anemia   ? Dizziness   ? Elevated AST (SGOT) 10/2016  ? Endometriosis   ? Left ovarian cyst   ? Low ferritin   ? Migraines   ? without aura  ? STD (sexually transmitted disease)   ? HX HSV  ? Tinnitus, right ear   ? ?Past Surgical History:  ?Procedure Laterality Date  ? ABLATION ON ENDOMETRIOSIS N/A 07/21/2015  ? Procedure: FULGERATION OF ENDOMETRIOSIS, BIOPSY OF POSTERIOR CUL DE SAC;  Surgeon: Patton Salles, MD;  Location: [facility];  Service: Gynecology;  Laterality: N/A;   ? APPENDECTOMY  2004  ? AUGMENTATION MAMMAPLASTY  2011  ? CESAREAN SECTION  2003  ? CHOLECYSTECTOMY  2004  ? LAPAROSCOPIC TUBAL LIGATION Right 07/21/2015  ? Procedure: LAPAROSCOPIC TUBAL LIGATION;  Surgeon: Patton Salles, MD;  Location: [facility];  Service: Gynecology;  Laterality: Right;  ? OVARIAN CYST REMOVAL Left 07/21/2015  ? Procedure: EXCISION PARA-OVARIAN CYST;  Surgeon: Patton Salles, MD;  Location: [facility];  Service: Gynecology;  Laterality: Left;  ? UNILATERAL SALPINGECTOMY Left 07/21/2015  ? Procedure: DISTAL UNILATERAL SALPINGECTOMY;  Surgeon: Patton Salles, MD;  Location: [facility];  Service: Gynecology;  Laterality: Left;  ?  ? ?Current Meds  ?Medication Sig  ? Cholecalciferol (VITAMIN D-3 PO) Take 1 tablet by mouth daily.  ? cyclobenzaprine (FLEXERIL) 10 MG tablet Take 1 tablet (10 mg total) by mouth 3 (three) times daily as needed for muscle spasms.  ? ELDERBERRY PO Take 1 tablet by mouth daily.  ? EPINEPHrine 0.3 mg/0.3 mL IJ SOAJ injection Inject 0.3 mg into the muscle as needed for anaphylaxis.  ? estradiol (ESTRACE) 0.1 MG/GM vaginal cream Use 1/2 g vaginally two or three times per week as needed to maintain symptom relief.  ? ibuprofen (ADVIL,MOTRIN) 200 MG tablet Take 600-800 mg by mouth every 8 (eight) hours as needed.   ? loratadine (CLARITIN) 10 MG tablet Take 10 mg by mouth daily.  ? Multiple Vitamins-Minerals (  MULTI ADULT GUMMIES) CHEW   ? norethindrone (MICRONOR) 0.35 MG tablet Take 1 tablet (0.35 mg total) by mouth daily.  ? valACYclovir (VALTREX) 500 MG tablet Take one tablet by mouth daily for prevention of infection.  Take one tablet twice a day for 3 days as needed for an outbreak.  ? zinc gluconate 50 MG tablet Take 50 mg by mouth daily.  ?  ? ?Allergies:   Venofer [iron sucrose]  ? ?Social History  ? ?Tobacco Use  ? Smoking status: Light Smoker  ?  Types: Cigarettes  ? Smokeless tobacco: Never  ? Tobacco comments:  ?  smokes 5 cigs/week  ?Vaping Use  ?  Vaping Use: Never used  ?Substance Use Topics  ? Alcohol use: Not Currently  ?  Comment: 3 beers/month  ? Drug use: No  ?  ? ?Family Hx: ?The patient's family history includes Colon polyps in her mother; Diabetes in her father and paternal grandmother; Hyperlipidemia in her father and paternal grandmother; Hypertension in her father and paternal grandmother. ? ?ROS:   ?Please see the history of present illness.    ?All other systems reviewed and are negative. ? ?Labs/Other Tests and Data Reviewed:   ? ?Recent Labs: ?03/15/2021: TSH 1.78 ?05/05/2021: ALT 24; BUN 14; Creatinine 0.9; Potassium 3.4; Sodium 140 ?09/17/2021: Hemoglobin 12.7; Platelets 302  ? ?Recent Lipid Panel ?Lab Results  ?Component Value Date/Time  ? CHOL 190 03/15/2021 03:45 PM  ? CHOL 167 03/11/2020 10:28 AM  ? TRIG 62 03/15/2021 03:45 PM  ? HDL 70 03/15/2021 03:45 PM  ? HDL 57 03/11/2020 10:28 AM  ? CHOLHDL 2.7 03/15/2021 03:45 PM  ? LDLCALC 106 (H) 03/15/2021 03:45 PM  ? ? ?Wt Readings from Last 3 Encounters:  ?10/27/21 128 lb (58.1 kg)  ?09/17/21 127 lb 6.4 oz (57.8 kg)  ?07/23/21 127 lb (57.6 kg)  ?  ? ?Objective:   ? ?Vital Signs:  Temp 98.4 ?F (36.9 ?C)   Ht 5\' 3"  (1.6 m)   Wt 128 lb (58.1 kg)   BMI 22.67 kg/m?   ? ?VITAL SIGNS:  reviewed ?GEN:  no acute distress ? ?ASSESSMENT & PLAN:   ? ?Sinusitis - rx for omnicef as directed - continue otc meds - follow up if symptoms persist/worsen ? ?COVID-19 Education: ?The signs and symptoms of COVID-19 were discussed with the patient and how to seek care for testing (follow up with PCP or arrange E-visit). The importance of social distancing was discussed today. ? ?Time:   ?Today, I have spent 10 minutes with the patient with telehealth technology discussing the above problems.   ? ? ?Medication Adjustments/Labs and Tests Ordered: ?Current medicines are reviewed at length with the patient today.  Concerns regarding medicines are outlined above.  ? ?Tests Ordered: ?No orders of the defined types were  placed in this encounter. ? ? ?Medication Changes: ?No orders of the defined types were placed in this encounter. ? ? ?Follow Up:  In Person prn ? ?Signed, ?SARA R Alira Fretwell, PA-C  ?10/27/2021 3:33 PM    ?[facility]  ? ?

## 2021-11-03 ENCOUNTER — Other Ambulatory Visit: Payer: Self-pay

## 2022-01-12 ENCOUNTER — Ambulatory Visit: Payer: BC Managed Care – PPO | Admitting: Physician Assistant

## 2022-01-12 ENCOUNTER — Encounter: Payer: Self-pay | Admitting: Physician Assistant

## 2022-01-12 VITALS — BP 98/62 | HR 52 | Temp 97.3°F | Ht 63.0 in | Wt 133.0 lb

## 2022-01-12 DIAGNOSIS — J301 Allergic rhinitis due to pollen: Secondary | ICD-10-CM

## 2022-01-12 DIAGNOSIS — J0110 Acute frontal sinusitis, unspecified: Secondary | ICD-10-CM

## 2022-01-12 MED ORDER — TRIAMCINOLONE ACETONIDE 40 MG/ML IJ SUSP
60.0000 mg | Freq: Once | INTRAMUSCULAR | Status: AC
  Administered 2022-01-12: 60 mg via INTRAMUSCULAR

## 2022-01-12 MED ORDER — AZITHROMYCIN 250 MG PO TABS: ORAL_TABLET | ORAL | 0 refills | Status: AC

## 2022-01-12 NOTE — Progress Notes (Signed)
Acute Office Visit  Subjective:    Patient ID: Ann Whitehead, female    DOB: 01-Mar-1981, 41 y.o.   MRN: [id number]  Chief Complaint  Patient presents with   Allergies    HPI: Patient is in today for complaints of sinus pressure, pnd and scratchy throat for past few days - has had significant trouble with allergies as well - requests kenalog shot Denies fever or malaise  Past Medical History:  Diagnosis Date   Abnormal uterine bleeding (AUB)    Acute hearing loss    Right   Anemia    Dizziness    Elevated AST (SGOT) 10/2016   Endometriosis    Left ovarian cyst    Low ferritin    Migraines    without aura   STD (sexually transmitted disease)    HX HSV   Tinnitus, right ear     Past Surgical History:  Procedure Laterality Date   ABLATION ON ENDOMETRIOSIS N/A 07/21/2015   Procedure: FULGERATION OF ENDOMETRIOSIS, BIOPSY OF POSTERIOR CUL DE SAC;  Surgeon: Patton Salles, MD;  Location: [facility];  Service: Gynecology;  Laterality: N/A;   APPENDECTOMY  2004   AUGMENTATION MAMMAPLASTY  2011   CESAREAN SECTION  2003   CHOLECYSTECTOMY  2004   LAPAROSCOPIC TUBAL LIGATION Right 07/21/2015   Procedure: LAPAROSCOPIC TUBAL LIGATION;  Surgeon: Patton Salles, MD;  Location: [facility];  Service: Gynecology;  Laterality: Right;   OVARIAN CYST REMOVAL Left 07/21/2015   Procedure: EXCISION PARA-OVARIAN CYST;  Surgeon: Patton Salles, MD;  Location: [facility];  Service: Gynecology;  Laterality: Left;   UNILATERAL SALPINGECTOMY Left 07/21/2015   Procedure: DISTAL UNILATERAL SALPINGECTOMY;  Surgeon: Patton Salles, MD;  Location: [facility];  Service: Gynecology;  Laterality: Left;    Family History  Problem Relation Age of Onset   Colon polyps Mother        multiple pre-cancerous polyps on several colonoscopies   Diabetes Father    Hypertension Father    Hyperlipidemia Father    Diabetes Paternal Grandmother    Hyperlipidemia Paternal Grandmother     Hypertension Paternal Grandmother     Social History   Socioeconomic History   Marital status: Married    Spouse name: SCOTT   Number of children: 1   Years of education: 12 + 2   Highest education level: Not on file  Occupational History   Occupation: [employer]  Tobacco Use   Smoking status: Light Smoker    Types: Cigarettes   Smokeless tobacco: Never   Tobacco comments:    smokes 5 cigs/week  Vaping Use   Vaping Use: Never used  Substance and Sexual Activity   Alcohol use: Not Currently    Comment: 3 beers/month   Drug use: No   Sexual activity: Yes    Partners: Male    Birth control/protection: Surgical    Comment: Tubal  Other Topics Concern   Not on file  Social History Narrative   Not on file   Social Determinants of Health   Financial Resource Strain: Not on file  Food Insecurity: Not on file  Transportation Needs: Not on file  Physical Activity: Not on file  Stress: Not on file  Social Connections: Not on file  Intimate Partner Violence: Not on file    Outpatient Medications Prior to Visit  Medication Sig Dispense Refill   Cholecalciferol (VITAMIN D-3 PO) Take 1 tablet by mouth daily.  cyclobenzaprine (FLEXERIL) 10 MG tablet Take 1 tablet (10 mg total) by mouth 3 (three) times daily as needed for muscle spasms. 30 tablet 0   ELDERBERRY PO Take 1 tablet by mouth daily.     EPINEPHrine 0.3 mg/0.3 mL IJ SOAJ injection Inject 0.3 mg into the muscle as needed for anaphylaxis. 1 each 3   estradiol (ESTRACE) 0.1 MG/GM vaginal cream Use 1/2 g vaginally two or three times per week as needed to maintain symptom relief. 42.5 g 2   ibuprofen (ADVIL,MOTRIN) 200 MG tablet Take 600-800 mg by mouth every 8 (eight) hours as needed.      loratadine (CLARITIN) 10 MG tablet Take 10 mg by mouth daily.     Multiple Vitamins-Minerals (MULTI ADULT GUMMIES) CHEW      norethindrone (MICRONOR) 0.35 MG tablet Take 1 tablet (0.35 mg total) by mouth daily. 84 tablet 3    valACYclovir (VALTREX) 500 MG tablet Take one tablet by mouth daily for prevention of infection.  Take one tablet twice a day for 3 days as needed for an outbreak. 100 tablet 3   cefdinir (OMNICEF) 300 MG capsule Take 1 capsule (300 mg total) by mouth 2 (two) times daily. 20 capsule 0   zinc gluconate 50 MG tablet Take 50 mg by mouth daily.     No facility-administered medications prior to visit.    Allergies  Allergen Reactions   Venofer [Iron Sucrose] Hives    Patient was discharged from the clinic after receiving Venofer in stable condition. She returned 30 minutes later concerned that she was having a reaction. She presented very flushed and complained of tingling in her hands and feet. She does not complain of respiratory concerns. She does feel very "jittery". IV was started and she was given Benadryl 50 mg and Pepcid 20 mg. She was feeling some improvement after the benadryl.    Review of Systems CONSTITUTIONAL: Negative for chills, fatigue, fever, E/N/T: see HPI CARDIOVASCULAR: Negative for chest pain, dizziness, palpitations and pedal edema.  RESPIRATORY: Negative for recent cough and dyspnea.          Objective:    PHYSICAL EXAM:   VS: BP 98/62 (BP Location: Left Arm, Patient Position: Sitting)   Pulse (!) 52   Temp (!) 97.3 F (36.3 C) (Temporal)   Ht 5\' 3"  (1.6 m)   Wt 133 lb (60.3 kg)   SpO2 99%   BMI 23.56 kg/m   GEN: Well nourished, well developed, in no acute distress  HEENT: normal external ears and nose - normal external auditory canals and TMS -  - Lips, Teeth and Gums - normal  Oropharynx - erythema/pnd  Cardiac: RRR; no murmurs, rubs, Respiratory:  normal respiratory rate and pattern with no distress - normal breath sounds with no rales, rhonchi, wheezes or rubs    Lab Results  Component Value Date   TSH 1.78 03/15/2021   Lab Results  Component Value Date   WBC 6.9 09/17/2021   HGB 12.7 09/17/2021   HCT 38 09/17/2021   MCV 98.2 03/15/2021    PLT 302 09/17/2021   Lab Results  Component Value Date   NA 140 05/05/2021   K 3.4 05/05/2021   CO2 28 (A) 05/05/2021   GLUCOSE 83 03/15/2021   BUN 14 05/05/2021   CREATININE 0.9 05/05/2021   BILITOT 0.4 03/15/2021   ALKPHOS 40 05/05/2021   AST 33 05/05/2021   ALT 24 05/05/2021   PROT 7.4 03/15/2021   ALBUMIN 4.7 05/05/2021  CALCIUM 10.1 05/05/2021   Lab Results  Component Value Date   CHOL 190 03/15/2021   Lab Results  Component Value Date   HDL 70 03/15/2021   Lab Results  Component Value Date   LDLCALC 106 (H) 03/15/2021   Lab Results  Component Value Date   TRIG 62 03/15/2021   Lab Results  Component Value Date   CHOLHDL 2.7 03/15/2021   No results found for: HGBA1C     Assessment & Plan:   Problem List Items Addressed This Visit       Respiratory   Allergic rhinitis due to allergen   Relevant Medications   triamcinolone acetonide (KENALOG-40) injection 60 mg (Start on 01/12/2022  9:30 AM)   Other Visit Diagnoses     Acute non-recurrent frontal sinusitis    -  Primary   Relevant Medications   triamcinolone acetonide (KENALOG-40) injection 60 mg (Start on 01/12/2022  9:30 AM)   azithromycin (ZITHROMAX) 250 MG tablet      Meds ordered this encounter  Medications   triamcinolone acetonide (KENALOG-40) injection 60 mg   azithromycin (ZITHROMAX) 250 MG tablet    Sig: Take 2 tablets on day 1, then 1 tablet daily on days 2 through 5    Dispense:  6 tablet    Refill:  0    Order Specific Question:   Supervising Provider    Answer:   Blane OharaOX, KIRSTEN [[id number]]    No orders of the defined types were placed in this encounter.    Follow-up: Return if symptoms worsen or fail to improve.  An After Visit Summary was printed and given to the patient.  Jettie PaganSARA R Basem Yannuzzi, PA-C [facility] [phone]

## 2022-01-14 ENCOUNTER — Ambulatory Visit: Payer: BC Managed Care – PPO | Admitting: Physician Assistant

## 2022-01-21 ENCOUNTER — Other Ambulatory Visit: Payer: Self-pay

## 2022-01-21 NOTE — Telephone Encounter (Signed)
FYI. Per refill request: pt's last refill was on 01/02/22 for #100 tabs.   Last AEX: 03/15/21--scheduled 03/17/22.

## 2022-03-10 DIAGNOSIS — R1310 Dysphagia, unspecified: Secondary | ICD-10-CM | POA: Diagnosis not present

## 2022-03-10 DIAGNOSIS — K5900 Constipation, unspecified: Secondary | ICD-10-CM | POA: Diagnosis not present

## 2022-03-10 DIAGNOSIS — K649 Unspecified hemorrhoids: Secondary | ICD-10-CM | POA: Diagnosis not present

## 2022-03-10 DIAGNOSIS — K219 Gastro-esophageal reflux disease without esophagitis: Secondary | ICD-10-CM | POA: Diagnosis not present

## 2022-03-17 ENCOUNTER — Ambulatory Visit: Payer: BC Managed Care – PPO | Admitting: Obstetrics and Gynecology

## 2022-03-28 NOTE — Progress Notes (Unsigned)
41 y.o. G98P1001 Married Caucasian female here for annual exam.    PCP:   Marianne Sofia, PA-C  No LMP recorded.           Sexually active: {yes no:314532}  The current method of family planning is*** tubal ligation/Micronor.    Exercising: {yes no:314532}  {types:19826} Smoker:  {YES NO:22349}  Health Maintenance: Pap:  03-15-21 ASCUS:Neg HR HPV, 03-11-20 ASCUS:Neg HR HPV, 03-06-19 ASCUS:Neg HR HPV History of abnormal Pap:  yes, 03-06-19 ASCUS:Neg HR HPV--colpo showed Neg ECC,  01/26/18 ASCUS:Pos HR HPV--03-09-18 colpo biopsies were normal MMG:  N/A Colonoscopy:  N/A BMD:   N/A  Result  N/A TDaP:  10-10-14 Gardasil:   no HIV: 03-09-18 NR Hep C: 03-09-18 Neg Screening Labs:  Hb today: ***, Urine today: ***   reports that she has been smoking cigarettes. She has never used smokeless tobacco. She reports that she does not currently use alcohol. She reports that she does not use drugs.  Past Medical History:  Diagnosis Date   Abnormal uterine bleeding (AUB)    Acute hearing loss    Right   Anemia    Dizziness    Elevated AST (SGOT) 10/2016   Endometriosis    Left ovarian cyst    Low ferritin    Migraines    without aura   STD (sexually transmitted disease)    HX HSV   Tinnitus, right ear     Past Surgical History:  Procedure Laterality Date   ABLATION ON ENDOMETRIOSIS N/A 07/21/2015   Procedure: FULGERATION OF ENDOMETRIOSIS, BIOPSY OF POSTERIOR CUL DE SAC;  Surgeon: Patton Salles, MD;  Location: [facility];  Service: Gynecology;  Laterality: N/A;   APPENDECTOMY  2004   AUGMENTATION MAMMAPLASTY  2011   CESAREAN SECTION  2003   CHOLECYSTECTOMY  2004   LAPAROSCOPIC TUBAL LIGATION Right 07/21/2015   Procedure: LAPAROSCOPIC TUBAL LIGATION;  Surgeon: Patton Salles, MD;  Location: [facility];  Service: Gynecology;  Laterality: Right;   OVARIAN CYST REMOVAL Left 07/21/2015   Procedure: EXCISION PARA-OVARIAN CYST;  Surgeon: Patton Salles, MD;  Location: [facility];   Service: Gynecology;  Laterality: Left;   UNILATERAL SALPINGECTOMY Left 07/21/2015   Procedure: DISTAL UNILATERAL SALPINGECTOMY;  Surgeon: Patton Salles, MD;  Location: [facility];  Service: Gynecology;  Laterality: Left;    Current Outpatient Medications  Medication Sig Dispense Refill   Cholecalciferol (VITAMIN D-3 PO) Take 1 tablet by mouth daily.     cyclobenzaprine (FLEXERIL) 10 MG tablet Take 1 tablet (10 mg total) by mouth 3 (three) times daily as needed for muscle spasms. 30 tablet 0   ELDERBERRY PO Take 1 tablet by mouth daily.     EPINEPHrine 0.3 mg/0.3 mL IJ SOAJ injection Inject 0.3 mg into the muscle as needed for anaphylaxis. 1 each 3   estradiol (ESTRACE) 0.1 MG/GM vaginal cream Use 1/2 g vaginally two or three times per week as needed to maintain symptom relief. 42.5 g 2   ibuprofen (ADVIL,MOTRIN) 200 MG tablet Take 600-800 mg by mouth every 8 (eight) hours as needed.      loratadine (CLARITIN) 10 MG tablet Take 10 mg by mouth daily.     Multiple Vitamins-Minerals (MULTI ADULT GUMMIES) CHEW      norethindrone (MICRONOR) 0.35 MG tablet Take 1 tablet (0.35 mg total) by mouth daily. 84 tablet 3   valACYclovir (VALTREX) 500 MG tablet Take one tablet by mouth daily for prevention  of infection.  Take one tablet twice a day for 3 days as needed for an outbreak. 100 tablet 3   No current facility-administered medications for this visit.    Family History  Problem Relation Age of Onset   Colon polyps Mother        multiple pre-cancerous polyps on several colonoscopies   Diabetes Father    Hypertension Father    Hyperlipidemia Father    Diabetes Paternal Grandmother    Hyperlipidemia Paternal Grandmother    Hypertension Paternal Grandmother     Review of Systems  Exam:   There were no vitals taken for this visit.    General appearance: alert, cooperative and appears stated age Head: normocephalic, without obvious abnormality, atraumatic Neck: no adenopathy, supple,  symmetrical, trachea midline and thyroid normal to inspection and palpation Lungs: clear to auscultation bilaterally Breasts: normal appearance, no masses or tenderness, No nipple retraction or dimpling, No nipple discharge or bleeding, No axillary adenopathy Heart: regular rate and rhythm Abdomen: soft, non-tender; no masses, no organomegaly Extremities: extremities normal, atraumatic, no cyanosis or edema Skin: skin color, texture, turgor normal. No rashes or lesions Lymph nodes: cervical, supraclavicular, and axillary nodes normal. Neurologic: grossly normal  Pelvic: External genitalia:  no lesions              No abnormal inguinal nodes palpated.              Urethra:  normal appearing urethra with no masses, tenderness or lesions              Bartholins and Skenes: normal                 Vagina: normal appearing vagina with normal color and discharge, no lesions              Cervix: no lesions              Pap taken: {yes no:314532} Bimanual Exam:  Uterus:  normal size, contour, position, consistency, mobility, non-tender              Adnexa: no mass, fullness, tenderness              Rectal exam: {yes no:314532}.  Confirms.              Anus:  normal sphincter tone, no lesions  Chaperone was present for exam:  ***  Assessment:   Well woman visit with gynecologic exam.   Plan: Mammogram screening discussed. Self breast awareness reviewed. Pap and HR HPV as above. Guidelines for Calcium, Vitamin D, regular exercise program including cardiovascular and weight bearing exercise.   Follow up annually and prn.   Additional counseling given.  {yes [id number]. _______ minutes face to face time of which over 50% was spent in counseling.    After visit summary provided.

## 2022-03-30 ENCOUNTER — Ambulatory Visit (INDEPENDENT_AMBULATORY_CARE_PROVIDER_SITE_OTHER): Payer: BC Managed Care – PPO | Admitting: Obstetrics and Gynecology

## 2022-03-30 ENCOUNTER — Other Ambulatory Visit (HOSPITAL_COMMUNITY)
Admission: RE | Admit: 2022-03-30 | Discharge: 2022-03-30 | Disposition: A | Discharge status: Home / Self Care | Payer: BC Managed Care – PPO | Source: Ambulatory Visit | Attending: Obstetrics and Gynecology | Admitting: Obstetrics and Gynecology

## 2022-03-30 ENCOUNTER — Encounter: Payer: Self-pay | Admitting: Obstetrics and Gynecology

## 2022-03-30 VITALS — BP 118/62 | HR 66 | Ht 65.0 in | Wt 131.0 lb

## 2022-03-30 DIAGNOSIS — Z0000 Encounter for general adult medical examination without abnormal findings: Secondary | ICD-10-CM | POA: Diagnosis not present

## 2022-03-30 DIAGNOSIS — D649 Anemia, unspecified: Secondary | ICD-10-CM

## 2022-03-30 DIAGNOSIS — E785 Hyperlipidemia, unspecified: Secondary | ICD-10-CM | POA: Diagnosis not present

## 2022-03-30 DIAGNOSIS — R102 Pelvic and perineal pain: Secondary | ICD-10-CM

## 2022-03-30 DIAGNOSIS — N921 Excessive and frequent menstruation with irregular cycle: Secondary | ICD-10-CM

## 2022-03-30 DIAGNOSIS — Z124 Encounter for screening for malignant neoplasm of cervix: Secondary | ICD-10-CM

## 2022-03-30 DIAGNOSIS — Z01419 Encounter for gynecological examination (general) (routine) without abnormal findings: Secondary | ICD-10-CM

## 2022-03-30 MED ORDER — ESTRADIOL 0.1 MG/GM VA CREA: TOPICAL_CREAM | VAGINAL | 2 refills | Status: DC

## 2022-03-30 MED ORDER — VALACYCLOVIR HCL 500 MG PO TABS: ORAL_TABLET | ORAL | 3 refills | Status: DC

## 2022-03-30 MED ORDER — ALPRAZOLAM 0.25 MG PO TABS: ORAL_TABLET | ORAL | 0 refills | Status: DC

## 2022-03-30 NOTE — Patient Instructions (Addendum)
EXERCISE AND DIET:  We recommended that you start or continue a regular exercise program for good health. Regular exercise means any activity that makes your heart beat faster and makes you sweat.  We recommend exercising at least 30 minutes per day at least 3 days a week, preferably 4 or 5.  We also recommend a diet low in fat and sugar.  Inactivity, poor dietary choices and obesity can cause diabetes, heart attack, stroke, and kidney damage, among others.    ALCOHOL AND SMOKING:  Women should limit their alcohol intake to no more than 7 drinks/beers/glasses of wine (combined, not each!) per week. Moderation of alcohol intake to this level decreases your risk of breast cancer and liver damage. And of course, no recreational drugs are part of a healthy lifestyle.  And absolutely no smoking or even second hand smoke. Most people know smoking can cause heart and lung diseases, but did you know it also contributes to weakening of your bones? Aging of your skin?  Yellowing of your teeth and nails?  CALCIUM AND VITAMIN D:  Adequate intake of calcium and Vitamin D are recommended.  The recommendations for exact amounts of these supplements seem to change often, but generally speaking 600 mg of calcium (either carbonate or citrate) and 800 units of Vitamin D per day seems prudent. Certain women may benefit from higher intake of Vitamin D.  If you are among these women, your doctor will have told you during your visit.    PAP SMEARS:  Pap smears, to check for cervical cancer or precancers,  have traditionally been done yearly, although recent scientific advances have shown that most women can have pap smears less often.  However, every woman still should have a physical exam from her gynecologist every year. It will include a breast check, inspection of the vulva and vagina to check for abnormal growths or skin changes, a visual exam of the cervix, and then an exam to evaluate the size and shape of the uterus and  ovaries.  And after 40 years of age, a rectal exam is indicated to check for rectal cancers. We will also provide age appropriate advice regarding health maintenance, like when you should have certain vaccines, screening for sexually transmitted diseases, bone density testing, colonoscopy, mammograms, etc.   MAMMOGRAMS:  All women over 40 years old should have a yearly mammogram. Many facilities now offer a "3D" mammogram, which may cost around $50 extra out of pocket. If possible,  we recommend you accept the option to have the 3D mammogram performed.  It both reduces the number of women who will be called back for extra views which then turn out to be normal, and it is better than the routine mammogram at detecting truly abnormal areas.    COLONOSCOPY:  Colonoscopy to screen for colon cancer is recommended for all women at age 50.  We know, you hate the idea of the prep.  We agree, BUT, having colon cancer and not knowing it is worse!!  Colon cancer so often starts as a polyp that can be seen and removed at colonscopy, which can quite literally save your life!  And if your first colonoscopy is normal and you have no family history of colon cancer, most women don't have to have it again for 10 years.  Once every ten years, you can do something that may end up saving your life, right?  We will be happy to help you get it scheduled when you are ready.    Be sure to check your insurance coverage so you understand how much it will cost.  It may be covered as a preventative service at no cost, but you should check your particular policy.    Total Laparoscopic Hysterectomy A total laparoscopic hysterectomy is a minimally invasive surgery to remove the uterus and cervix. The fallopian tubes and ovaries can also be removed during this surgery, if necessary. This procedure may be done to treat problems such as: Growths in the uterus (uterine fibroids) that are not cancer but cause symptoms. A condition that causes  the lining of the uterus to grow in other areas (endometriosis). Problems with pelvic support. Cancer of the cervix, ovaries, uterus, or tissue that lines the uterus (endometrium). Excessive bleeding in the uterus. After this procedure, you will no longer be able to have a baby, and you will no longer have a menstrual period. Tell a health care provider about: Any allergies you have. All medicines you are taking, including vitamins, herbs, eye drops, creams, and over-the-counter medicines. Any problems you or family members have had with anesthetic medicines. Any blood disorders you have. Any surgeries you have had. Any medical conditions you have. Whether you are pregnant or may be pregnant. What are the risks? Generally, this is a safe procedure. However, problems may occur, including: Infection. Bleeding. Blood clots in the legs or lungs. Allergic reactions to medicines. Damage to nearby structures or organs. Having to change from this surgery to one in which a large incision is made in the abdomen (abdominal hysterectomy). What happens before the procedure? Staying hydrated Follow instructions from your health care provider about hydration, which may include: Up to 2 hours before the procedure - you may continue to drink clear liquids, such as water, clear fruit juice, black coffee, and plain tea.  Eating and drinking restrictions Follow instructions from your health care provider about eating and drinking, which may include: 8 hours before the procedure - stop eating heavy meals or foods, such as meat, fried foods, or fatty foods. 6 hours before the procedure - stop eating light meals or foods, such as toast or cereal. 6 hours before the procedure - stop drinking milk or drinks that contain milk. 2 hours before the procedure - stop drinking clear liquids. Medicines Take over-the-counter and prescription medicines only as told by your health care provider. You may be asked to  take medicine that helps you have a bowel movement (laxative) to prevent constipation. General instructions If you were asked to do bowel preparation before the procedure, follow instructions from your health care provider. This procedure can affect the way you feel about yourself. Talk with your health care provider about the physical and emotional changes hysterectomy may cause. Do not use any products that contain nicotine or tobacco for at least 4 weeks before the procedure. These products include cigarettes, chewing tobacco, and vaping devices, such as e-cigarettes. If you need help quitting, ask your health care provider. Plan to have a responsible adult take you home from the hospital or clinic. Plan to have a responsible adult care for you for the time you are told after you leave the hospital or clinic. This is important. Surgery safety Ask your health care provider: How your surgery site will be marked. What steps will be taken to help prevent infection. These may include: Removing hair at the surgery site. Washing skin with a germ-killing soap. Receiving antibiotic medicine. What happens during the procedure? An IV will be inserted into one of  your veins. You will be given one or more of the following: A medicine to help you relax (sedative). A medicine to make you fall asleep (general anesthetic). A medicine to numb the area (local anesthetic). A medicine that is injected into your spine to numb the area below and slightly above the injection site (spinal anesthetic). A medicine that is injected into an area of your body to numb everything below the injection site (regional anesthetic). A gas will be used to inflate your abdomen. This will allow your surgeon to look inside your abdomen and do the surgery. Three or four small incisions will be made in your abdomen. A small device with a light (laparoscope) will be inserted into one of your incisions. Surgical instruments will be  inserted through the other incisions in order to perform the procedure. Your uterus and cervix may be removed through your vagina or cut into small pieces and removed through the small incisions. Any other organs that need to be removed will also be removed this way. The gas will be released from inside your abdomen. Your incisions will be closed with stitches (sutures), skin glue, or adhesive strips. A bandage (dressing) may be placed over your incisions. The procedure may vary among health care providers and hospitals. What happens after the procedure? Your blood pressure, heart rate, breathing rate, and blood oxygen level will be monitored until you leave the hospital or clinic. You will be given medicine for pain as needed. You will be encouraged to walk as soon as possible. You will also use a device to help you breathe or do breathing exercises to keep your lungs clear. You may have to wear compression stockings. These stockings help to prevent blood clots and reduce swelling in your legs. You will need to wear a sanitary pad for vaginal discharge or bleeding. Summary Total laparoscopic hysterectomy is a procedure to remove your uterus, cervix, and sometimes the fallopian tubes and ovaries. This procedure can affect the way you feel about yourself. Talk with your health care provider about the physical and emotional changes hysterectomy may cause. After this procedure, you will no longer be able to have a baby, and you will no longer have a menstrual period. You will be given pain medicine to control discomfort after this procedure. Plan to have a responsible adult take you home from the hospital or clinic. This information is not intended to replace advice given to you by your health care provider. Make sure you discuss any questions you have with your health care provider. Document Revised: 10/13/2021 Document Reviewed: 04/03/2020 Elsevier Patient Education  2023 [employer].

## 2022-03-31 LAB — COMPREHENSIVE METABOLIC PANEL
AG Ratio: 1.5 (calc) (ref 1.0–2.5)
ALT: 18 U/L (ref 6–29)
AST: 18 U/L (ref 10–30)
Albumin: 4.3 g/dL (ref 3.6–5.1)
Alkaline phosphatase (APISO): 30 U/L — ABNORMAL LOW (ref 31–125)
BUN: 18 mg/dL (ref 7–25)
CO2: 27 mmol/L (ref 20–32)
Calcium: 9.7 mg/dL (ref 8.6–10.2)
Chloride: 103 mmol/L (ref 98–110)
Creat: 0.84 mg/dL (ref 0.50–0.97)
Globulin: 2.8 g/dL (calc) (ref 1.9–3.7)
Glucose, Bld: 83 mg/dL (ref 65–99)
Potassium: 4.5 mmol/L (ref 3.5–5.3)
Sodium: 140 mmol/L (ref 135–146)
Total Bilirubin: 0.3 mg/dL (ref 0.2–1.2)
Total Protein: 7.1 g/dL (ref 6.1–8.1)

## 2022-03-31 LAB — CBC
HCT: 39.3 % (ref 35.0–45.0)
Hemoglobin: 13 g/dL (ref 11.7–15.5)
MCH: 32.4 pg (ref 27.0–33.0)
MCHC: 33.1 g/dL (ref 32.0–36.0)
MCV: 98 fL (ref 80.0–100.0)
MPV: 9.9 fL (ref 7.5–12.5)
Platelets: 295 10*3/uL (ref 140–400)
RBC: 4.01 10*6/uL (ref 3.80–5.10)
RDW: 11.7 % (ref 11.0–15.0)
WBC: 7.1 10*3/uL (ref 3.8–10.8)

## 2022-03-31 LAB — TSH: TSH: 1.84 mIU/L

## 2022-03-31 LAB — LIPID PANEL
Cholesterol: 185 mg/dL (ref <200)
HDL: 71 mg/dL (ref >=50)
LDL Cholesterol (Calc): 103 mg/dL (calc) — ABNORMAL HIGH
Non-HDL Cholesterol (Calc): 114 mg/dL (calc) (ref <130)
Total CHOL/HDL Ratio: 2.6 (calc) (ref <5.0)
Triglycerides: 38 mg/dL (ref <150)

## 2022-03-31 LAB — IRON: Iron: 102 ug/dL (ref 40–190)

## 2022-03-31 LAB — FERRITIN: Ferritin: 36 ng/mL (ref 16–154)

## 2022-04-08 LAB — CYTOLOGY - PAP
Comment: NEGATIVE
Diagnosis: NEGATIVE
High risk HPV: NEGATIVE

## 2022-04-11 DIAGNOSIS — K5900 Constipation, unspecified: Secondary | ICD-10-CM | POA: Diagnosis not present

## 2022-04-11 DIAGNOSIS — K219 Gastro-esophageal reflux disease without esophagitis: Secondary | ICD-10-CM | POA: Diagnosis not present

## 2022-04-26 ENCOUNTER — Ambulatory Visit (INDEPENDENT_AMBULATORY_CARE_PROVIDER_SITE_OTHER): Payer: BC Managed Care – PPO | Admitting: Obstetrics and Gynecology

## 2022-04-26 ENCOUNTER — Ambulatory Visit (INDEPENDENT_AMBULATORY_CARE_PROVIDER_SITE_OTHER): Payer: BC Managed Care – PPO

## 2022-04-26 ENCOUNTER — Encounter: Payer: Self-pay | Admitting: Obstetrics and Gynecology

## 2022-04-26 ENCOUNTER — Other Ambulatory Visit (HOSPITAL_COMMUNITY)
Admission: RE | Admit: 2022-04-26 | Discharge: 2022-04-26 | Disposition: A | Discharge status: Home / Self Care | Payer: BC Managed Care – PPO | Source: Ambulatory Visit | Attending: Obstetrics and Gynecology | Admitting: Obstetrics and Gynecology

## 2022-04-26 VITALS — BP 110/70 | HR 70 | Ht 65.0 in | Wt 131.0 lb

## 2022-04-26 DIAGNOSIS — N921 Excessive and frequent menstruation with irregular cycle: Secondary | ICD-10-CM

## 2022-04-26 NOTE — Patient Instructions (Signed)
Total Laparoscopic Hysterectomy A total laparoscopic hysterectomy is a minimally invasive surgery to remove the uterus and cervix. The fallopian tubes and ovaries can also be removed during this surgery, if necessary. This procedure may be done to treat problems such as: Growths in the uterus (uterine fibroids) that are not cancer but cause symptoms. A condition that causes the lining of the uterus to grow in other areas (endometriosis). Problems with pelvic support. Cancer of the cervix, ovaries, uterus, or tissue that lines the uterus (endometrium). Excessive bleeding in the uterus. After this procedure, you will no longer be able to have a baby, and you will no longer have a menstrual period. Tell a health care provider about: Any allergies you have. All medicines you are taking, including vitamins, herbs, eye drops, creams, and over-the-counter medicines. Any problems you or family members have had with anesthetic medicines. Any blood disorders you have. Any surgeries you have had. Any medical conditions you have. Whether you are pregnant or may be pregnant. What are the risks? Generally, this is a safe procedure. However, problems may occur, including: Infection. Bleeding. Blood clots in the legs or lungs. Allergic reactions to medicines. Damage to nearby structures or organs. Having to change from this surgery to one in which a large incision is made in the abdomen (abdominal hysterectomy). What happens before the procedure? Staying hydrated Follow instructions from your health care provider about hydration, which may include: Up to 2 hours before the procedure - you may continue to drink clear liquids, such as water, clear fruit juice, black coffee, and plain tea.  Eating and drinking restrictions Follow instructions from your health care provider about eating and drinking, which may include: 8 hours before the procedure - stop eating heavy meals or foods, such as meat, fried  foods, or fatty foods. 6 hours before the procedure - stop eating light meals or foods, such as toast or cereal. 6 hours before the procedure - stop drinking milk or drinks that contain milk. 2 hours before the procedure - stop drinking clear liquids. Medicines Take over-the-counter and prescription medicines only as told by your health care provider. You may be asked to take medicine that helps you have a bowel movement (laxative) to prevent constipation. General instructions If you were asked to do bowel preparation before the procedure, follow instructions from your health care provider. This procedure can affect the way you feel about yourself. Talk with your health care provider about the physical and emotional changes hysterectomy may cause. Do not use any products that contain nicotine or tobacco for at least 4 weeks before the procedure. These products include cigarettes, chewing tobacco, and vaping devices, such as e-cigarettes. If you need help quitting, ask your health care provider. Plan to have a responsible adult take you home from the hospital or clinic. Plan to have a responsible adult care for you for the time you are told after you leave the hospital or clinic. This is important. Surgery safety Ask your health care provider: How your surgery site will be marked. What steps will be taken to help prevent infection. These may include: Removing hair at the surgery site. Washing skin with a germ-killing soap. Receiving antibiotic medicine. What happens during the procedure? An IV will be inserted into one of your veins. You will be given one or more of the following: A medicine to help you relax (sedative). A medicine to make you fall asleep (general anesthetic). A medicine to numb the area (local anesthetic). A medicine   that is injected into your spine to numb the area below and slightly above the injection site (spinal anesthetic). A medicine that is injected into an area of  your body to numb everything below the injection site (regional anesthetic). A gas will be used to inflate your abdomen. This will allow your surgeon to look inside your abdomen and do the surgery. Three or four small incisions will be made in your abdomen. A small device with a light (laparoscope) will be inserted into one of your incisions. Surgical instruments will be inserted through the other incisions in order to perform the procedure. Your uterus and cervix may be removed through your vagina or cut into small pieces and removed through the small incisions. Any other organs that need to be removed will also be removed this way. The gas will be released from inside your abdomen. Your incisions will be closed with stitches (sutures), skin glue, or adhesive strips. A bandage (dressing) may be placed over your incisions. The procedure may vary among health care providers and hospitals. What happens after the procedure? Your blood pressure, heart rate, breathing rate, and blood oxygen level will be monitored until you leave the hospital or clinic. You will be given medicine for pain as needed. You will be encouraged to walk as soon as possible. You will also use a device to help you breathe or do breathing exercises to keep your lungs clear. You may have to wear compression stockings. These stockings help to prevent blood clots and reduce swelling in your legs. You will need to wear a sanitary pad for vaginal discharge or bleeding. Summary Total laparoscopic hysterectomy is a procedure to remove your uterus, cervix, and sometimes the fallopian tubes and ovaries. This procedure can affect the way you feel about yourself. Talk with your health care provider about the physical and emotional changes hysterectomy may cause. After this procedure, you will no longer be able to have a baby, and you will no longer have a menstrual period. You will be given pain medicine to control discomfort after this  procedure. Plan to have a responsible adult take you home from the hospital or clinic. This information is not intended to replace advice given to you by your health care provider. Make sure you discuss any questions you have with your health care provider. Document Revised: 10/13/2021 Document Reviewed: 04/03/2020 Elsevier Patient Education  2023 [employer] Inc[employer]  

## 2022-04-26 NOTE — Progress Notes (Signed)
GYNECOLOGY  VISIT   HPI: 41 y.o.   Married  Caucasian  female   [id number] with Patient's last menstrual period was 04/15/2022 (exact date).   here for pelvic ultrasound and EMB.  Has heavy menstrual bleeding.   Sept. 1 period was heavy with bleeding with change every 30 min and also bled through at night.     She has been doing iron infusions due to anemia. Last Hgb 13.0, iron 102, and ferritin 36 on 03/15/22.  Wants hysterectomy done. Declines future childbearing.  New insurance year starts over Nov. 1.   Will have disability paper work dropped off.  She plans for return to work 3 weeks post surgery.   No bladder control issues.  No incontinence.   GYNECOLOGIC HISTORY: Patient's last menstrual period was 04/15/2022 (exact date). Contraception:  Tubal Menopausal hormone therapy:  n/a Last mammogram:  N/A Last pap smear:   03-30-22 Neg:Neg HR HPV, 03-15-21 ASCUS:Neg HR HPV, 03-11-20 ASCUS:Neg HR HPV        OB History     Gravida  1   Para  1   Term  1   Preterm      AB      Living  1      SAB      IAB      Ectopic      Multiple      Live Births                 Patient Active Problem List   Diagnosis Date Noted   Iron deficiency anemia due to chronic blood loss 05/05/2021   Allergic rhinitis due to allergen 11/05/2019   Abnormal uterine bleeding 10/26/2018    Past Medical History:  Diagnosis Date   Abnormal uterine bleeding (AUB)    Acid reflux disease    Acute hearing loss    Right   Anemia    Dizziness    Elevated AST (SGOT) 10/2016   Endometriosis    Left ovarian cyst    Low ferritin    Migraines    without aura   STD (sexually transmitted disease)    HX HSV   Tinnitus, right ear     Past Surgical History:  Procedure Laterality Date   ABLATION ON ENDOMETRIOSIS N/A 07/21/2015   Procedure: FULGERATION OF ENDOMETRIOSIS, BIOPSY OF POSTERIOR CUL DE SAC;  Surgeon: Patton Salles, MD;  Location: [facility];  Service: Gynecology;   Laterality: N/A;   APPENDECTOMY  2004   AUGMENTATION MAMMAPLASTY  2011   CESAREAN SECTION  2003   CHOLECYSTECTOMY  2004   LAPAROSCOPIC TUBAL LIGATION Right 07/21/2015   Procedure: LAPAROSCOPIC TUBAL LIGATION;  Surgeon: Patton Salles, MD;  Location: [facility];  Service: Gynecology;  Laterality: Right;   OVARIAN CYST REMOVAL Left 07/21/2015   Procedure: EXCISION PARA-OVARIAN CYST;  Surgeon: Patton Salles, MD;  Location: [facility];  Service: Gynecology;  Laterality: Left;   UNILATERAL SALPINGECTOMY Left 07/21/2015   Procedure: DISTAL UNILATERAL SALPINGECTOMY;  Surgeon: Patton Salles, MD;  Location: [facility];  Service: Gynecology;  Laterality: Left;    Current Outpatient Medications  Medication Sig Dispense Refill   ALPRAZolam (XANAX) 0.25 MG tablet Take one tablet (0.25 mg) by mouth one hour prior to procedure. 2 tablet 0   ELDERBERRY PO Take 1 tablet by mouth daily.     EPINEPHrine 0.3 mg/0.3 mL IJ SOAJ injection Inject 0.3 mg into the muscle as  needed for anaphylaxis. 1 each 3   estradiol (ESTRACE) 0.1 MG/GM vaginal cream Use 1/2 g vaginally two or three times per week as needed to maintain symptom relief. 42.5 g 2   ibuprofen (ADVIL,MOTRIN) 200 MG tablet Take 600-800 mg by mouth every 8 (eight) hours as needed.      loratadine (CLARITIN) 10 MG tablet Take 10 mg by mouth daily.     Multiple Vitamins-Minerals (MULTI ADULT GUMMIES) CHEW      omeprazole (PRILOSEC) 40 MG capsule Take 40 mg by mouth every morning.     valACYclovir (VALTREX) 500 MG tablet Take one tablet by mouth daily for prevention of infection.  Take one tablet twice a day for 3 days as needed for an outbreak. 100 tablet 3   No current facility-administered medications for this visit.     ALLERGIES: Venofer [iron sucrose]  Family History  Problem Relation Age of Onset   Colon polyps Mother        multiple pre-cancerous polyps on several colonoscopies   Diabetes Father    Hypertension Father     Hyperlipidemia Father    Diabetes Paternal Grandmother    Hyperlipidemia Paternal Grandmother    Hypertension Paternal Grandmother     Social History   Socioeconomic History   Marital status: Married    Spouse name: SCOTT   Number of children: 1   Years of education: 12 + 2   Highest education level: Not on file  Occupational History   Occupation: [employer]  Tobacco Use   Smoking status: Former    Types: Cigarettes   Smokeless tobacco: Never   Tobacco comments:    smokes 5 cigs/week  Vaping Use   Vaping Use: Never used  Substance and Sexual Activity   Alcohol use: Not Currently    Comment: 1 beers/month   Drug use: No   Sexual activity: Yes    Partners: Male    Birth control/protection: Surgical    Comment: Tubal  Other Topics Concern   Not on file  Social History Narrative   Not on file   Social Determinants of Health   Financial Resource Strain: Not on file  Food Insecurity: Not on file  Transportation Needs: Not on file  Physical Activity: Not on file  Stress: Not on file  Social Connections: Not on file  Intimate Partner Violence: Not on file    Review of Systems  All other systems reviewed and are negative.   PHYSICAL EXAMINATION:    BP 110/70   Pulse 70   Ht 5\' 5"  (1.651 m)   Wt 131 lb (59.4 kg)   LMP 04/15/2022 (Exact Date)   SpO2 99%   BMI 21.80 kg/m     General appearance: alert, cooperative and appears stated age  06/15/2022  Uterus 9.21 x 5.06 x 4.4 cm.  Heterogeneous myometrium.   EMS 7.91 mm.  Left ovary - 2.79 x 1.64 x 1.14 cm.  Sparse follicles.  Right ovary - 3.23 x 3.85 x 3.77 cm.  2.8 cm simple right ovarian cyst.  No adnexal masses.  No free fluid.   EMB  Consent done.  Sterile Hibiclens prep.  Paracervical block with 10 cc 1% lidocaine, lot [place], exp 08/16/23. Tenaculum to anterior cervical lip.  Pipelle passed to 8 cm x 2.  Tissue to pathology.  No complications.  Minimal EBL.  Chaperone was present for exam:   10/14/23, CMA  ASSESSMENT  Menorrhagia with irregular menses.  Hx endometriosis.  Simple right  ovarian cyst.  Possible adenomyosis.  Status post BTL.  Status post left salpingectomy.  Off Micronor.  Anemia.  Treated with iron infusions.   PLAN  Pelvic US images and report reviewed.  FU EMB.  Will start precert for total laparoscopic hysterectomy with right salpingectomy, possible bilateral oophorectomy, cystoscopy.  Written information on laparoscopic hysterectomy to patient.    An After Visit Summary was printed and given to the patient.  20 min  total time was spent for this patient encounter, including preparation, face-to-face counseling with the patient, coordination of care, and documentation of the encounter.

## 2022-04-28 LAB — SURGICAL PATHOLOGY

## 2022-04-29 ENCOUNTER — Encounter: Payer: Self-pay | Admitting: Obstetrics and Gynecology

## 2022-04-29 ENCOUNTER — Telehealth: Payer: Self-pay | Admitting: Obstetrics and Gynecology

## 2022-04-29 NOTE — Telephone Encounter (Signed)
Please precert and schedule a total laparoscopic hysterectomy with right salpingectomy, possible bilateral oophorectomy, cystoscopy at [facility].  My patient has menorrhagia with irregular menses.   Time needed is 3 hours.  She needs to proceed before her new insurance year begins November 1.   Patient will need a 30 minute preop appointment with me.  I discussed a minimum of 3 weeks off from work.  Her goal is to return to light duty at work after 3 weeks.

## 2022-04-29 NOTE — Telephone Encounter (Signed)
Patient replied to your my chart message you sent  "Hi Ann Whitehead,    I am sharing good news! Your endometrial biopsy showed proliferative effect, which is estrogen effect.  This is a normal finding.  There was no sign of infection, polyps, precancerous or cancerous change.    Please contact the office for any questions.    Have a good weekend!   Conley Simmonds, MD"

## 2022-05-03 NOTE — Telephone Encounter (Signed)
See open telephone encounter dated 04/29/22.   Encounter closed.

## 2022-05-03 NOTE — Telephone Encounter (Signed)
Spoke with patient. Reviewed surgery dates. Patient request to proceed with surgery on 06/14/22.  Patient traveling 10/16- 06/03/22. Advised patient I will forward to business office for return call. I will return call once surgery date and time confirmed. Patient verbalizes understanding and is agreeable.   Surgery request sent.   Routing to [employer]

## 2022-05-04 NOTE — Telephone Encounter (Signed)
Spoke with patient regarding surgery benefits. Patient acknowledges understanding of information presented. Patient is aware that benefits presented are professional benefits only. Patient is aware the hospital will call with facility benefits. See account note.Front desk aware of PR  Routing to Jill Hamm, RN.  

## 2022-05-09 NOTE — Telephone Encounter (Signed)
Left message to call Octavia Velador, RN at [facility], [phone], OPT 5.  

## 2022-05-09 NOTE — Telephone Encounter (Signed)
Spoke with patient. Surgery date request confirmed.  Advised surgery is scheduled for [facility] on 06/14/22 at 0730.  Surgery instruction sheet and hospital brochure reviewed, printed copy will be mailed.  Patient verbalizes understanding and is agreeable.  Routing to provider. Encounter closed.    Cc: Kimalexis

## 2022-05-11 NOTE — Progress Notes (Signed)
GYNECOLOGY  VISIT   HPI: 41 y.o.   Married  Caucasian  female   G1P1001 with Patient's last menstrual period was 05/13/2022 (exact date).   here for surgical consult. (Total laparoscopic hysterectomy with right salpingectomy, possible bilateral oophorectomy, cystoscopy)  Patient has heavy menstrual bleeding and history of anemia.  [place] on 04/26/22 showed heterogenous myometrium and a 2.8 cm simple right ovarian cyst.  Her endometrial biopsy 04/26/22 showed proliferative endometrium, negative for hyperplasia and malignancy.  Her CBC on 03/30/22 showed hgb 13.0.  She has a history of endometriosis treated laparoscopically in 2016.  She does have pelvic pain outside her cycle time.  She feels pelvic pain and back pain radiating down her right leg for 3 weeks every month.  She has rectal pain with sitting for a long time.  If she strains, she may have rectal bleeding.  She does not have rectal bleeding during her cycle.  She sleeps with a heating pad.  She takes ibuprofen 800 mg and an extra strength Tylenol more days than not.   She desires hysterectomy and declines future childbearing.   No change in her medical history since her last office visit.  She recently had an allergic reaction to a pink mask.   Usually wears an N95 mask at work.   After surgery, she plans to return to work on July 25, 2022. She dropped off [employer] paperwork today.  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  She will need work restrictions for 12 weeks post op.   GYNECOLOGIC HISTORY: Patient's last menstrual period was 05/13/2022 (exact date). Contraception:  tubal Menopausal hormone therapy:  estrace vaginal cream Last mammogram:  none Last pap smear:   03-30-22 neg HPV HR neg        OB History     Gravida  1   Para  1   Term  1   Preterm      AB      Living  1      SAB      IAB      Ectopic      Multiple       Live Births                 Patient Active Problem List   Diagnosis Date Noted   Iron deficiency anemia due to chronic blood loss 05/05/2021   Allergic rhinitis due to allergen 11/05/2019   Abnormal uterine bleeding 10/26/2018    Past Medical History:  Diagnosis Date   Abnormal uterine bleeding (AUB)    Acid reflux disease    Acute hearing loss    Right   Anemia    Dizziness    Elevated AST (SGOT) 10/2016   Endometriosis    Left ovarian cyst    Low ferritin    Migraines    without aura   STD (sexually transmitted disease)    HX HSV   Tinnitus, right ear     Past Surgical History:  Procedure Laterality Date   ABLATION ON ENDOMETRIOSIS N/A 07/21/2015   Procedure: FULGERATION OF ENDOMETRIOSIS, BIOPSY OF POSTERIOR CUL DE [place];  Surgeon: Nunzio Cobbs, MD;  Location: [facility];  Service: Gynecology;  Laterality: N/A;   APPENDECTOMY  2004   AUGMENTATION MAMMAPLASTY  2011   CESAREAN SECTION  2003   CHOLECYSTECTOMY  2004   LAPAROSCOPIC TUBAL LIGATION Right 07/21/2015  Procedure: LAPAROSCOPIC TUBAL LIGATION;  Surgeon: Patton Salles, MD;  Location: [facility];  Service: Gynecology;  Laterality: Right;   OVARIAN CYST REMOVAL Left 07/21/2015   Procedure: EXCISION PARA-OVARIAN CYST;  Surgeon: Patton Salles, MD;  Location: [facility];  Service: Gynecology;  Laterality: Left;   UNILATERAL SALPINGECTOMY Left 07/21/2015   Procedure: DISTAL UNILATERAL SALPINGECTOMY;  Surgeon: Patton Salles, MD;  Location: [facility];  Service: Gynecology;  Laterality: Left;    Current Outpatient Medications  Medication Sig Dispense Refill   Ascorbic Acid (VITAMIN C PO) Take by mouth.     BIOTIN PO Take by mouth.     ELDERBERRY PO Take 1 tablet by mouth daily.     estradiol (ESTRACE) 0.1 MG/GM vaginal cream Use 1/2 g vaginally two or three times per week as needed to maintain symptom relief. 42.5 g 2   ibuprofen (ADVIL,MOTRIN) 200 MG tablet Take 600-800 mg by mouth  every 8 (eight) hours as needed.      Multiple Vitamins-Minerals (ZINC PO) Take by mouth.     Omega-3 Fatty Acids (FISH OIL PO) Take by mouth.     omeprazole (PRILOSEC) 40 MG capsule Take 40 mg by mouth every morning.     valACYclovir (VALTREX) 500 MG tablet Take one tablet by mouth daily for prevention of infection.  Take one tablet twice a day for 3 days as needed for an outbreak. 100 tablet 3   EPINEPHrine 0.3 mg/0.3 mL IJ SOAJ injection Inject 0.3 mg into the muscle as needed for anaphylaxis. (Patient not taking: Reported on 05/17/2022) 1 each 3   No current facility-administered medications for this visit.     ALLERGIES: Venofer [iron sucrose]  Family History  Problem Relation Age of Onset   Colon polyps Mother        multiple pre-cancerous polyps on several colonoscopies   Diabetes Father    Hypertension Father    Hyperlipidemia Father    Diabetes Paternal Grandmother    Hyperlipidemia Paternal Grandmother    Hypertension Paternal Grandmother     Social History   Socioeconomic History   Marital status: Married    Spouse name: SCOTT   Number of children: 1   Years of education: 12 + 2   Highest education level: Not on file  Occupational History   Occupation: [employer]  Tobacco Use   Smoking status: Former    Types: Cigarettes   Smokeless tobacco: Never   Tobacco comments:    smokes 5 cigs/week  Vaping Use   Vaping Use: Never used  Substance and Sexual Activity   Alcohol use: Not Currently   Drug use: No   Sexual activity: Yes    Partners: Male    Birth control/protection: Surgical    Comment: Tubal  Other Topics Concern   Not on file  Social History Narrative   Not on file   Social Determinants of Health   Financial Resource Strain: Not on file  Food Insecurity: Not on file  Transportation Needs: Not on file  Physical Activity: Not on file  Stress: Not on file  Social Connections: Not on file  Intimate Partner Violence: Not on file     Review of Systems  Constitutional: Negative.   HENT: Negative.    Eyes: Negative.   Respiratory: Negative.    Cardiovascular: Negative.   Gastrointestinal: Negative.   Endocrine: Negative.   Genitourinary: Negative.   Musculoskeletal: Negative.   Skin: Negative.   Allergic/Immunologic:  Negative.   Neurological: Negative.   Hematological: Negative.   Psychiatric/Behavioral: Negative.      PHYSICAL EXAMINATION:    BP 106/68   Pulse 68   Ht 5' 4.25" (1.632 m)   Wt 131 lb (59.4 kg)   LMP 05/13/2022 (Exact Date)   SpO2 (!) 16%   BMI 22.31 kg/m     General appearance: alert, cooperative and appears stated age Head: Normocephalic, without obvious abnormality, atraumatic Neck: no adenopathy, supple, symmetrical, trachea midline and thyroid normal to inspection and palpation Lungs: clear to auscultation bilaterally Heart: regular rate and rhythm Abdomen: soft, non-tender, no masses,  no organomegaly Extremities: extremities normal, atraumatic, no cyanosis or edema Skin: Skin color, texture, turgor normal. No rashes or lesions No abnormal inguinal nodes palpated Neurologic: Grossly normal  Pelvic: External genitalia:  no lesions              Urethra:  normal appearing urethra with no masses, tenderness or lesions              Bartholins and Skenes: normal                 Vagina: normal appearing vagina with normal color and discharge, no lesions              Cervix: no lesions                Bimanual Exam:  Uterus:  normal size, contour, position, consistency, mobility, non-tender              Adnexa: no mass, fullness, tenderness               Chaperone was present for exam:  Yes.  ASSESSMENT  Menorrhagia with irregular menses.  Pelvic pain.  Hx endometriosis.  Simple right ovarian cyst.  Possible adenomyosis.  Status post BTL.  Status post left salpingectomy.  Off Micronor.  Hx anemia.  Treated in past with iron infusions.    PLAN  I discussed total  laparoscopic hysterectomy with right salpingectomy and possible bilateral oophorectomy, cystoscopy. If she has mild to moderate endometriosis, she would like it treated and maintain at least one ovary.    I reviewed risks, benefits, and alternatives.  Risks include but are not limited to bleeding, infection, damage to surrounding organs, pneumonia, reaction to anesthesia, DVT, PE, death, need for reoperation, hernia formation, vaginal cuff dehiscence, need to convert to a traditional laparotomy incision to complete the procedure, and menopausal symptoms if ovaries are removed.    Surgical expectations and recovery discussed.  Patient wishes to proceed.  Questions invited and answered.    An After Visit Summary was printed and given to the patient.  35 min  total time was spent for this patient encounter, including preparation, face-to-face counseling with the patient, coordination of care, and documentation of the encounter.

## 2022-05-17 ENCOUNTER — Encounter: Payer: Self-pay | Admitting: Obstetrics and Gynecology

## 2022-05-17 ENCOUNTER — Ambulatory Visit (INDEPENDENT_AMBULATORY_CARE_PROVIDER_SITE_OTHER): Payer: BC Managed Care – PPO | Admitting: Obstetrics and Gynecology

## 2022-05-17 VITALS — BP 106/68 | HR 68 | Ht 64.25 in | Wt 131.0 lb

## 2022-05-17 DIAGNOSIS — N921 Excessive and frequent menstruation with irregular cycle: Secondary | ICD-10-CM | POA: Diagnosis not present

## 2022-05-17 DIAGNOSIS — R102 Pelvic and perineal pain unspecified side: Secondary | ICD-10-CM

## 2022-05-18 DIAGNOSIS — Z0289 Encounter for other administrative examinations: Secondary | ICD-10-CM

## 2022-05-21 NOTE — H&P (Signed)
Office Visit  05/17/2022 [facility], MD Obstetrics and Gynecology Menorrhagia with irregular cycle +1 more Dx surgical consult ; Referred by Marge Duncans, PA-C Reason for Visit   Additional Documentation  Vitals:  BP 106/68 Pulse 68 Ht 5' 4.25" (1.632 m) Wt 59.4 kg LMP 05/13/2022 (Exact Date) SpO2 16% Important   BMI 22.31 kg/m BSA 1.64 m  More Vitals  Flowsheets:  Method of Visit, Anthropometrics, Menstrual History, NEWS, MEWS Score   Encounter Info:  Billing Info, History, Allergies, Detailed Report    All Notes    Progress Notes by Nunzio Cobbs, MD at 05/17/2022 8:00 AM  Author: Nunzio Cobbs, MD Author Type: Physician Filed: 05/21/2022  9:42 AM  Note Status: Signed Cosign: Cosign Not Required Encounter Date: 05/17/2022  Editor: Nunzio Cobbs, MD (Physician)      Prior Versions: [place], [place], CMA (Certified Medical Assistant) at 05/17/2022  8:07 AM - Sign when Signing Visit    GYNECOLOGY  VISIT   HPI: 41 y.o.   Married  Caucasian  female   G1P1001 with Patient's last menstrual period was 05/13/2022 (exact date).   here for surgical consult. (Total laparoscopic hysterectomy with right salpingectomy, possible bilateral oophorectomy, cystoscopy)   Patient has heavy menstrual bleeding and history of anemia.  [place] on 04/26/22 showed heterogenous myometrium and a 2.8 cm simple right ovarian cyst.  Her endometrial biopsy 04/26/22 showed proliferative endometrium, negative for hyperplasia and malignancy.   Her CBC on 03/30/22 showed hgb 13.0.   She has a history of endometriosis treated laparoscopically in 2016.   She does have pelvic pain outside her cycle time.  She feels pelvic pain and back pain radiating down her right leg for 3 weeks every month.  She has rectal pain with sitting for a long time.  If she strains, she may have rectal bleeding.  She does not have rectal  bleeding during her cycle.  She sleeps with a heating pad.  She takes ibuprofen 800 mg and an extra strength Tylenol more days than not.    She desires hysterectomy and declines future childbearing.    No change in her medical history since her last office visit.  She recently had an allergic reaction to a pink mask.   Usually wears an N95 mask at work.    After surgery, she plans to return to work on July 25, 2022. She dropped off [employer] paperwork today.  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  She will need work restrictions for 12 weeks post op.    GYNECOLOGIC HISTORY: Patient's last menstrual period was 05/13/2022 (exact date). Contraception:  tubal Menopausal hormone therapy:  estrace vaginal cream Last mammogram:  none Last pap smear:   03-30-22 neg HPV HR neg        OB History       Gravida  1   Para  1   Term  1   Preterm      AB      Living  1        SAB      IAB      Ectopic      Multiple      Live Births  Patient Active Problem List    Diagnosis Date Noted   Iron deficiency anemia due to chronic blood loss 05/05/2021   Allergic rhinitis due to allergen 11/05/2019   Abnormal uterine bleeding 10/26/2018          Past Medical History:  Diagnosis Date   Abnormal uterine bleeding (AUB)     Acid reflux disease     Acute hearing loss      Right   Anemia     Dizziness     Elevated AST (SGOT) 10/2016   Endometriosis     Left ovarian cyst     Low ferritin     Migraines      without aura   STD (sexually transmitted disease)      HX HSV   Tinnitus, right ear             Past Surgical History:  Procedure Laterality Date   ABLATION ON ENDOMETRIOSIS N/A 07/21/2015    Procedure: FULGERATION OF ENDOMETRIOSIS, BIOPSY OF POSTERIOR CUL DE [place];  Surgeon: Patton Salles, MD;  Location: [facility];  Service: Gynecology;  Laterality:  N/A;   APPENDECTOMY   2004   AUGMENTATION MAMMAPLASTY   2011   CESAREAN SECTION   2003   CHOLECYSTECTOMY   2004   LAPAROSCOPIC TUBAL LIGATION Right 07/21/2015    Procedure: LAPAROSCOPIC TUBAL LIGATION;  Surgeon: Patton Salles, MD;  Location: [facility];  Service: Gynecology;  Laterality: Right;   OVARIAN CYST REMOVAL Left 07/21/2015    Procedure: EXCISION PARA-OVARIAN CYST;  Surgeon: Patton Salles, MD;  Location: [facility];  Service: Gynecology;  Laterality: Left;   UNILATERAL SALPINGECTOMY Left 07/21/2015    Procedure: DISTAL UNILATERAL SALPINGECTOMY;  Surgeon: Patton Salles, MD;  Location: [facility];  Service: Gynecology;  Laterality: Left;            Current Outpatient Medications  Medication Sig Dispense Refill   Ascorbic Acid (VITAMIN C PO) Take by mouth.       BIOTIN PO Take by mouth.       ELDERBERRY PO Take 1 tablet by mouth daily.       estradiol (ESTRACE) 0.1 MG/GM vaginal cream Use 1/2 g vaginally two or three times per week as needed to maintain symptom relief. 42.5 g 2   ibuprofen (ADVIL,MOTRIN) 200 MG tablet Take 600-800 mg by mouth every 8 (eight) hours as needed.        Multiple Vitamins-Minerals (ZINC PO) Take by mouth.       Omega-3 Fatty Acids (FISH OIL PO) Take by mouth.       omeprazole (PRILOSEC) 40 MG capsule Take 40 mg by mouth every morning.       valACYclovir (VALTREX) 500 MG tablet Take one tablet by mouth daily for prevention of infection.  Take one tablet twice a day for 3 days as needed for an outbreak. 100 tablet 3   EPINEPHrine 0.3 mg/0.3 mL IJ SOAJ injection Inject 0.3 mg into the muscle as needed for anaphylaxis. (Patient not taking: Reported on 05/17/2022) 1 each 3    No current facility-administered medications for this visit.      ALLERGIES: Venofer [iron sucrose]        Family History  Problem Relation Age of Onset   Colon polyps Mother          multiple pre-cancerous polyps on several colonoscopies   Diabetes Father      Hypertension Father  Hyperlipidemia Father     Diabetes Paternal Grandmother     Hyperlipidemia Paternal Grandmother     Hypertension Paternal Grandmother        Social History         Socioeconomic History   Marital status: Married      Spouse name: SCOTT   Number of children: 1   Years of education: 12 + 2   Highest education level: Not on file  Occupational History   Occupation: [employer]  Tobacco Use   Smoking status: Former      Types: Cigarettes   Smokeless tobacco: Never   Tobacco comments:      smokes 5 cigs/week  Vaping Use   Vaping Use: Never used  Substance and Sexual Activity   Alcohol use: Not Currently   Drug use: No   Sexual activity: Yes      Partners: Male      Birth control/protection: Surgical      Comment: Tubal  Other Topics Concern   Not on file  Social History Narrative   Not on file    Social Determinants of Health    Financial Resource Strain: Not on file  Food Insecurity: Not on file  Transportation Needs: Not on file  Physical Activity: Not on file  Stress: Not on file  Social Connections: Not on file  Intimate Partner Violence: Not on file      Review of Systems  Constitutional: Negative.   HENT: Negative.    Eyes: Negative.   Respiratory: Negative.    Cardiovascular: Negative.   Gastrointestinal: Negative.   Endocrine: Negative.   Genitourinary: Negative.   Musculoskeletal: Negative.   Skin: Negative.   Allergic/Immunologic: Negative.   Neurological: Negative.   Hematological: Negative.   Psychiatric/Behavioral: Negative.        PHYSICAL EXAMINATION:     BP 106/68   Pulse 68   Ht 5' 4.25" (1.632 m)   Wt 131 lb (59.4 kg)   LMP 05/13/2022 (Exact Date)   SpO2 (!) 16%   BMI 22.31 kg/m     General appearance: alert, cooperative and appears stated age Head: Normocephalic, without obvious abnormality, atraumatic Neck: no adenopathy, supple, symmetrical, trachea midline and thyroid normal to inspection and  palpation Lungs: clear to auscultation bilaterally Heart: regular rate and rhythm Abdomen: soft, non-tender, no masses,  no organomegaly Extremities: extremities normal, atraumatic, no cyanosis or edema Skin: Skin color, texture, turgor normal. No rashes or lesions No abnormal inguinal nodes palpated Neurologic: Grossly normal   Pelvic: External genitalia:  no lesions              Urethra:  normal appearing urethra with no masses, tenderness or lesions              Bartholins and Skenes: normal                 Vagina: normal appearing vagina with normal color and discharge, no lesions              Cervix: no lesions                Bimanual Exam:  Uterus:  normal size, contour, position, consistency, mobility, non-tender              Adnexa: no mass, fullness, tenderness                Chaperone was present for exam:  Yes.   ASSESSMENT   Menorrhagia with irregular menses.  Pelvic pain.  Hx endometriosis.  Simple right ovarian cyst.  Possible adenomyosis.  Status post BTL.  Status post left salpingectomy.  Off Micronor.  Hx anemia.  Treated in past with iron infusions.     PLAN   I discussed total laparoscopic hysterectomy with right salpingectomy and possible bilateral oophorectomy, cystoscopy. If she has mild to moderate endometriosis, she would like it treated and maintain at least one ovary.     I reviewed risks, benefits, and alternatives.  Risks include but are not limited to bleeding, infection, damage to surrounding organs, pneumonia, reaction to anesthesia, DVT, PE, death, need for reoperation, hernia formation, vaginal cuff dehiscence, need to convert to a traditional laparotomy incision to complete the procedure, and menopausal symptoms if ovaries are removed.    Surgical expectations and recovery discussed.  Patient wishes to proceed.   Questions invited and answered.    An After Visit Summary was printed and given to the patient.   35 min  total time was spent  for this patient encounter, including preparation, face-to-face counseling with the patient, coordination of care, and documentation of the encounter.

## 2022-05-25 ENCOUNTER — Telehealth: Payer: Self-pay | Admitting: *Deleted

## 2022-05-25 NOTE — Telephone Encounter (Signed)
Call returned to patient.  Patient request to review pre-op instructions specifically regarding shaving. Instructions reviewed, questions answered.   Encounter closed.

## 2022-06-02 NOTE — Progress Notes (Signed)
Your procedure is scheduled on  Tuesday, 06-14-2022  Report to [facility] AT  5:30 AM   Call this number if you have problems the morning of surgery  :[phone].   OUR ADDRESS IS [address].  WE ARE LOCATED IN [facility].  PLEASE BRING YOUR INSURANCE CARD AND PHOTO ID DAY OF SURGERY.  ONLY 2 PEOPLE ARE ALLOWED IN  WAITING  ROOM.                                      REMEMBER:  DO NOT EAT FOOD, CANDY GUM OR MINTS  AFTER MIDNIGHT THE NIGHT BEFORE YOUR SURGERY . YOU MAY HAVE CLEAR LIQUIDS FROM MIDNIGHT THE NIGHT BEFORE YOUR SURGERY UNTIL  _4:30 AM. NO CLEAR LIQUIDS AFTER   4:30 AM_ DAY OF SURGERY.  YOU MAY  BRUSH YOUR TEETH MORNING OF SURGERY AND RINSE YOUR MOUTH OUT, NO CHEWING GUM CANDY OR MINTS.     CLEAR LIQUID Allowed  Water Black coffee/ Tea (NO cream or milk products of any type, may sweeten) Sports drink like gatorade _____________________________________________________________________     TAKE ONLY THESE MEDICATIONS MORNING OF SURGERY:    UP TO 4 VISITORS  MAY VISIT IN THE EXTENDED RECOVERY ROOM UNTIL 800 PM ONLY.  ONE  VISITOR AGE 39 AND OVER MAY SPEND THE NIGHT AND MUST BE IN EXTENDED RECOVERY ROOM NO LATER THAN 800 PM . YOUR DISCHARGE TIME AFTER YOU SPEND THE NIGHT IS 900 AM THE MORNING AFTER YOUR SURGERY.  YOU MAY PACK A SMALL OVERNIGHT BAG WITH TOILETRIES FOR YOUR OVERNIGHT STAY IF YOU WISH.  YOUR PRESCRIPTION MEDICATIONS WILL BE PROVIDED DURING [place].                                      DO NOT WEAR JEWERLY, MAKE UP. DO NOT WEAR LOTIONS, POWDERS, PERFUMES OR NAIL POLISH ON YOUR FINGERNAILS. TOENAIL POLISH IS OK TO WEAR. DO NOT SHAVE FOR 48 HOURS PRIOR TO DAY OF SURGERY.  CONTACTS, GLASSES, OR DENTURES MAY NOT BE WORN TO SURGERY.  REMEMBER: NO SMOKING, DRUGS OR ALCOHOL FOR 24 HOURS BEFORE YOUR SURGERY.                                    [facility] IS NOT RESPONSIBLE  FOR ANY BELONGINGS.                                                                     Marland Kitchen           [facility] - Preparing for Surgery Before surgery, you can play an important role.  Because skin is not sterile, your skin needs to be as free of germs as possible.  You can reduce the number of germs on your skin by washing with CHG (chlorahexidine gluconate) soap before surgery.  CHG is an antiseptic cleaner which kills germs and bonds with the skin to continue killing germs even after washing.  Please DO NOT use if you have an allergy to CHG or antibacterial soaps.  If your skin becomes reddened/irritated stop using the CHG and inform your nurse when you arrive at Short Stay. Do not shave (including legs and underarms) for at least 48 hours prior to the first CHG shower.  You may shave your face/neck. Please follow these instructions carefully:  1.  Shower with CHG Soap the night before surgery and the  morning of Surgery.  2.  If you choose to wash your hair, wash your hair first as usual with your  normal  shampoo.  3.  After you shampoo, rinse your hair and body thoroughly to remove the  shampoo.                                        4.  Use CHG as you would any other liquid soap.  You can apply chg directly  to the skin and wash , chg soap provided, night before and morning of your surgery.  5.  Apply the CHG Soap to your body ONLY FROM THE NECK DOWN.   Do not use on face/ open                           Wound or open sores. Avoid contact with eyes, ears mouth and genitals (private parts).                       Wash face,  Genitals (private parts) with your normal soap.             6.  Wash thoroughly, paying special attention to the area where your surgery  will be performed.  7.  Thoroughly rinse your body with warm water from the neck down.  8.  DO NOT shower/wash with your normal soap after using and rinsing off  the CHG Soap.             9.  Pat yourself dry with a clean towel.            10.  Wear clean pajamas.             11.  Place clean sheets on your bed the night of your first shower and do not  sleep with pets. Day of Surgery : Do not apply any lotions/deodorants the morning of surgery.  Please wear clean clothes to the hospital/surgery center.  IF YOU HAVE ANY SKIN IRRITATION OR PROBLEMS WITH THE SURGICAL SOAP, PLEASE GET A BAR OF GOLD DIAL SOAP AND SHOWER THE NIGHT BEFORE YOUR SURGERY AND THE MORNING OF YOUR SURGERY. PLEASE LET THE NURSE KNOW MORNING OF YOUR SURGERY IF YOU HAD ANY PROBLEMS WITH THE SURGICAL SOAP.   ________________________________________________________________________                                                        QUESTIONS [facility] PRE OP NURSE PHONE [phone].   Or Ann Whitehead   [phone]

## 2022-06-03 ENCOUNTER — Encounter (HOSPITAL_BASED_OUTPATIENT_CLINIC_OR_DEPARTMENT_OTHER): Payer: Self-pay | Admitting: Obstetrics and Gynecology

## 2022-06-03 ENCOUNTER — Other Ambulatory Visit: Payer: Self-pay

## 2022-06-03 NOTE — Progress Notes (Signed)
Spoke w/ via phone for pre-op interview---pt  Lab needs dos---- UPT              Lab results------CBC, BMP,T&S COVID test -----patient states asymptomatic no test needed Arrive at -------0530 NPO after MN NO Solid Food.  Clear liquids from MN until---0430 Med rec completed Medications to take morning of surgery -----Ann Whitehead Diabetic medication -----n/a Patient instructed no nail polish to be worn day of surgery Patient instructed to bring photo id and insurance card day of surgery Patient aware to have Driver (ride ) / caregiver  husband Ann Whitehead  for 24 hours after surgery  Patient Special Instructions ----- Pre-Op special Istructions -----shower with CHG soap night before and morning of surgery.   Finishing drinking ensure before 0430 morning of surgery.  Patient verbalized understanding of instructions that were given at this phone interview. Patient denies shortness of breath, chest pain, fever, cough at this phone interview.    Reviewed [facility] instructions with pt.

## 2022-06-08 ENCOUNTER — Encounter (HOSPITAL_COMMUNITY)
Admission: RE | Admit: 2022-06-08 | Discharge: 2022-06-08 | Disposition: A | Discharge status: Home / Self Care | Payer: BC Managed Care – PPO | Source: Ambulatory Visit | Attending: Obstetrics and Gynecology | Admitting: Obstetrics and Gynecology

## 2022-06-08 DIAGNOSIS — Z01812 Encounter for preprocedural laboratory examination: Secondary | ICD-10-CM | POA: Insufficient documentation

## 2022-06-08 DIAGNOSIS — N921 Excessive and frequent menstruation with irregular cycle: Secondary | ICD-10-CM

## 2022-06-08 DIAGNOSIS — R102 Pelvic and perineal pain unspecified side: Secondary | ICD-10-CM

## 2022-06-08 LAB — BASIC METABOLIC PANEL
Anion gap: 8 (ref 5–15)
BUN: 15 mg/dL (ref 6–20)
CO2: 25 mmol/L (ref 22–32)
Calcium: 8.7 mg/dL — ABNORMAL LOW (ref 8.9–10.3)
Chloride: 105 mmol/L (ref 98–111)
Creatinine, Ser: 0.7 mg/dL (ref 0.44–1.00)
GFR, Estimated: >60 mL/min (ref >60)
Glucose, Bld: 93 mg/dL (ref 70–99)
Potassium: 3.6 mmol/L (ref 3.5–5.1)
Sodium: 138 mmol/L (ref 135–145)

## 2022-06-08 LAB — CBC
HCT: 35.7 % — ABNORMAL LOW (ref 36.0–46.0)
Hemoglobin: 11.7 g/dL — ABNORMAL LOW (ref 12.0–15.0)
MCH: 32.7 pg (ref 26.0–34.0)
MCHC: 32.8 g/dL (ref 30.0–36.0)
MCV: 99.7 fL (ref 80.0–100.0)
Platelets: 296 10*3/uL (ref 150–400)
RBC: 3.58 MIL/uL — ABNORMAL LOW (ref 3.87–5.11)
RDW: 12.2 % (ref 11.5–15.5)
WBC: 7.8 10*3/uL (ref 4.0–10.5)
nRBC: 0 % (ref 0.0–0.2)

## 2022-06-09 ENCOUNTER — Other Ambulatory Visit: Payer: Self-pay

## 2022-06-09 LAB — ALBUMIN: Albumin: 4.1 g/dL (ref 3.5–5.0)

## 2022-06-13 NOTE — Anesthesia Preprocedure Evaluation (Signed)
Anesthesia Evaluation  Patient identified by MRN, date of birth, ID band Patient awake    Reviewed: Allergy & Precautions, NPO status , Patient's Chart, lab work & pertinent test results  History of Anesthesia Complications Negative for: history of anesthetic complications  Airway Mallampati: III  TM Distance: >3 FB Neck ROM: Full    Dental  (+) Dental Advisory Given, Teeth Intact   Pulmonary former smoker,    Pulmonary exam normal        Cardiovascular negative cardio ROS Normal cardiovascular exam     Neuro/Psych  Headaches,  Tinnitus  negative psych ROS   GI/Hepatic Neg liver ROS, GERD  Medicated and Controlled,  Endo/Other  negative endocrine ROS  Renal/GU negative Renal ROS     Musculoskeletal negative musculoskeletal ROS (+)   Abdominal   Peds  Hematology  (+) Blood dyscrasia, anemia ,   Anesthesia Other Findings HSV  Reproductive/Obstetrics                           Anesthesia Physical Anesthesia Plan  ASA: 2  Anesthesia Plan: General   Post-op Pain Management: Tylenol PO (pre-op)* and Toradol IV (intra-op)*   Induction: Intravenous  PONV Risk Score and Plan: 3 and Treatment may vary due to age or medical condition, Ondansetron, Dexamethasone, Midazolam and Scopolamine patch - Pre-op  Airway Management Planned: Oral ETT  Additional Equipment: None  Intra-op Plan:   Post-operative Plan: Extubation in OR  Informed Consent: I have reviewed the patients History and Physical, chart, labs and discussed the procedure including the risks, benefits and alternatives for the proposed anesthesia with the patient or authorized representative who has indicated his/her understanding and acceptance.     Dental advisory given  Plan Discussed with: CRNA and Anesthesiologist  Anesthesia Plan Comments:        Anesthesia Quick Evaluation

## 2022-06-14 ENCOUNTER — Observation Stay (HOSPITAL_BASED_OUTPATIENT_CLINIC_OR_DEPARTMENT_OTHER): Payer: BC Managed Care – PPO | Admitting: Anesthesiology

## 2022-06-14 ENCOUNTER — Encounter (HOSPITAL_BASED_OUTPATIENT_CLINIC_OR_DEPARTMENT_OTHER): Payer: Self-pay | Admitting: Obstetrics and Gynecology

## 2022-06-14 ENCOUNTER — Other Ambulatory Visit: Payer: Self-pay | Admitting: Obstetrics and Gynecology

## 2022-06-14 ENCOUNTER — Encounter (HOSPITAL_BASED_OUTPATIENT_CLINIC_OR_DEPARTMENT_OTHER)
Admission: RE | Disposition: A | Discharge status: Home / Self Care | Payer: Self-pay | Source: Home / Self Care | Attending: Obstetrics and Gynecology

## 2022-06-14 ENCOUNTER — Other Ambulatory Visit: Payer: Self-pay

## 2022-06-14 ENCOUNTER — Observation Stay (HOSPITAL_BASED_OUTPATIENT_CLINIC_OR_DEPARTMENT_OTHER)
Admission: RE | Admit: 2022-06-14 | Discharge: 2022-06-14 | Disposition: A | Discharge status: Home / Self Care | Payer: BC Managed Care – PPO | Attending: Obstetrics and Gynecology | Admitting: Obstetrics and Gynecology

## 2022-06-14 DIAGNOSIS — N921 Excessive and frequent menstruation with irregular cycle: Secondary | ICD-10-CM | POA: Diagnosis not present

## 2022-06-14 DIAGNOSIS — N888 Other specified noninflammatory disorders of cervix uteri: Secondary | ICD-10-CM | POA: Diagnosis not present

## 2022-06-14 DIAGNOSIS — N809 Endometriosis, unspecified: Secondary | ICD-10-CM | POA: Diagnosis not present

## 2022-06-14 DIAGNOSIS — Z90710 Acquired absence of both cervix and uterus: Secondary | ICD-10-CM

## 2022-06-14 DIAGNOSIS — N83201 Unspecified ovarian cyst, right side: Secondary | ICD-10-CM | POA: Insufficient documentation

## 2022-06-14 DIAGNOSIS — N8000 Endometriosis of the uterus, unspecified: Principal | ICD-10-CM | POA: Insufficient documentation

## 2022-06-14 DIAGNOSIS — Z87891 Personal history of nicotine dependence: Secondary | ICD-10-CM | POA: Insufficient documentation

## 2022-06-14 DIAGNOSIS — R102 Pelvic and perineal pain unspecified side: Secondary | ICD-10-CM

## 2022-06-14 DIAGNOSIS — N72 Inflammatory disease of cervix uteri: Secondary | ICD-10-CM | POA: Insufficient documentation

## 2022-06-14 HISTORY — PX: TOTAL LAPAROSCOPIC HYSTERECTOMY WITH SALPINGECTOMY: SHX6742

## 2022-06-14 HISTORY — PX: CYSTOSCOPY: SHX5120

## 2022-06-14 HISTORY — DX: COVID-19: U07.1

## 2022-06-14 LAB — TYPE AND SCREEN
ABO/RH(D): A POS
Antibody Screen: NEGATIVE

## 2022-06-14 LAB — ABO/RH: ABO/RH(D): A POS

## 2022-06-14 LAB — POCT PREGNANCY, URINE: Preg Test, Ur: NEGATIVE

## 2022-06-14 SURGERY — HYSTERECTOMY, TOTAL, LAPAROSCOPIC, WITH SALPINGECTOMY
Anesthesia: General | Site: Uterus

## 2022-06-14 MED ORDER — IBUPROFEN 800 MG PO TABS: 800.0000 mg | ORAL_TABLET | Freq: Three times a day (TID) | ORAL | 1 refills | Status: DC | PRN

## 2022-06-14 MED ORDER — SODIUM CHLORIDE 0.9 % IR SOLN
Status: DC | PRN
  Administered 2022-06-14 (×2): 300 mL
  Administered 2022-06-14: 200 mL

## 2022-06-14 MED ORDER — DEXAMETHASONE SODIUM PHOSPHATE 4 MG/ML IJ SOLN
INTRAMUSCULAR | Status: DC | PRN
  Administered 2022-06-14: 8 mg via INTRAVENOUS

## 2022-06-14 MED ORDER — FENTANYL CITRATE (PF) 100 MCG/2ML IJ SOLN
INTRAMUSCULAR | Status: DC | PRN
  Administered 2022-06-14: 25 ug via INTRAVENOUS
  Administered 2022-06-14 (×2): 50 ug via INTRAVENOUS

## 2022-06-14 MED ORDER — OXYCODONE-ACETAMINOPHEN 5-325 MG PO TABS
ORAL_TABLET | ORAL | Status: AC
  Filled 2022-06-14: qty 1

## 2022-06-14 MED ORDER — ONDANSETRON HCL 4 MG/2ML IJ SOLN
4.0000 mg | Freq: Four times a day (QID) | INTRAMUSCULAR | Status: DC | PRN
  Administered 2022-06-14: 4 mg via INTRAVENOUS

## 2022-06-14 MED ORDER — FENTANYL CITRATE (PF) 100 MCG/2ML IJ SOLN
INTRAMUSCULAR | Status: AC
  Filled 2022-06-14: qty 2

## 2022-06-14 MED ORDER — ROCURONIUM BROMIDE 100 MG/10ML IV SOLN
INTRAVENOUS | Status: DC | PRN
  Administered 2022-06-14: 50 mg via INTRAVENOUS
  Administered 2022-06-14: 10 mg via INTRAVENOUS

## 2022-06-14 MED ORDER — GABAPENTIN 300 MG PO CAPS
300.0000 mg | ORAL_CAPSULE | ORAL | Status: AC
  Administered 2022-06-14: 300 mg via ORAL

## 2022-06-14 MED ORDER — HYDROMORPHONE HCL 1 MG/ML IJ SOLN
0.2500 mg | INTRAMUSCULAR | Status: DC | PRN
  Administered 2022-06-14 (×2): 0.25 mg via INTRAVENOUS

## 2022-06-14 MED ORDER — SCOPOLAMINE 1 MG/3DAYS TD PT72
1.0000 | MEDICATED_PATCH | TRANSDERMAL | Status: DC
  Administered 2022-06-14: 1.5 mg via TRANSDERMAL

## 2022-06-14 MED ORDER — SCOPOLAMINE 1 MG/3DAYS TD PT72
MEDICATED_PATCH | TRANSDERMAL | Status: AC
  Filled 2022-06-14: qty 1

## 2022-06-14 MED ORDER — OXYCODONE-ACETAMINOPHEN 5-325 MG PO TABS
1.0000 | ORAL_TABLET | ORAL | Status: DC | PRN
  Administered 2022-06-14: 1 via ORAL
  Administered 2022-06-14: 2 via ORAL

## 2022-06-14 MED ORDER — ACETAMINOPHEN 500 MG PO TABS
ORAL_TABLET | ORAL | Status: AC
  Filled 2022-06-14: qty 2

## 2022-06-14 MED ORDER — MENTHOL 3 MG MT LOZG: 1.0000 | LOZENGE | OROMUCOSAL | Status: DC | PRN

## 2022-06-14 MED ORDER — ACETAMINOPHEN 500 MG PO TABS: 1000.0000 mg | ORAL_TABLET | Freq: Once | ORAL | Status: DC

## 2022-06-14 MED ORDER — MORPHINE SULFATE (PF) 4 MG/ML IV SOLN: 1.0000 mg | INTRAVENOUS | Status: DC | PRN

## 2022-06-14 MED ORDER — OXYCODONE-ACETAMINOPHEN 5-325 MG PO TABS: 1.0000 | ORAL_TABLET | ORAL | 0 refills | Status: DC | PRN

## 2022-06-14 MED ORDER — ONDANSETRON HCL 4 MG/2ML IJ SOLN
INTRAMUSCULAR | Status: AC
  Filled 2022-06-14: qty 2

## 2022-06-14 MED ORDER — GABAPENTIN 300 MG PO CAPS
ORAL_CAPSULE | ORAL | Status: AC
  Filled 2022-06-14: qty 1

## 2022-06-14 MED ORDER — ONDANSETRON HCL 4 MG/2ML IJ SOLN
INTRAMUSCULAR | Status: DC | PRN
  Administered 2022-06-14: 4 mg via INTRAVENOUS

## 2022-06-14 MED ORDER — IBUPROFEN 800 MG PO TABS: 800.0000 mg | ORAL_TABLET | Freq: Three times a day (TID) | ORAL | Status: DC | PRN

## 2022-06-14 MED ORDER — GLYCOPYRROLATE 0.2 MG/ML IJ SOLN
INTRAMUSCULAR | Status: DC | PRN
  Administered 2022-06-14: .1 mg via INTRAVENOUS

## 2022-06-14 MED ORDER — OXYCODONE HCL 5 MG PO TABS
ORAL_TABLET | ORAL | Status: AC
  Filled 2022-06-14: qty 1

## 2022-06-14 MED ORDER — LACTATED RINGERS IV SOLN: INTRAVENOUS | Status: DC

## 2022-06-14 MED ORDER — PROPOFOL 10 MG/ML IV BOLUS
INTRAVENOUS | Status: DC | PRN
  Administered 2022-06-14: 150 mg via INTRAVENOUS

## 2022-06-14 MED ORDER — KETOROLAC TROMETHAMINE 30 MG/ML IJ SOLN
INTRAMUSCULAR | Status: AC
  Filled 2022-06-14: qty 1

## 2022-06-14 MED ORDER — ENOXAPARIN SODIUM 40 MG/0.4ML IJ SOSY
PREFILLED_SYRINGE | INTRAMUSCULAR | Status: AC
  Filled 2022-06-14: qty 0.4

## 2022-06-14 MED ORDER — KETOROLAC TROMETHAMINE 30 MG/ML IJ SOLN
30.0000 mg | Freq: Four times a day (QID) | INTRAMUSCULAR | Status: DC
  Administered 2022-06-14: 30 mg via INTRAVENOUS

## 2022-06-14 MED ORDER — OXYCODONE-ACETAMINOPHEN 5-325 MG PO TABS
ORAL_TABLET | ORAL | Status: AC
  Filled 2022-06-14: qty 2

## 2022-06-14 MED ORDER — LIDOCAINE HCL (CARDIAC) PF 100 MG/5ML IV SOSY
PREFILLED_SYRINGE | INTRAVENOUS | Status: DC | PRN
  Administered 2022-06-14: 60 mg via INTRAVENOUS

## 2022-06-14 MED ORDER — OXYCODONE HCL 5 MG PO TABS
5.0000 mg | ORAL_TABLET | Freq: Once | ORAL | Status: AC | PRN
  Administered 2022-06-14: 5 mg via ORAL

## 2022-06-14 MED ORDER — MIDAZOLAM HCL 2 MG/2ML IJ SOLN
INTRAMUSCULAR | Status: DC | PRN
  Administered 2022-06-14: 2 mg via INTRAVENOUS

## 2022-06-14 MED ORDER — PROMETHAZINE HCL 25 MG/ML IJ SOLN: 6.2500 mg | INTRAMUSCULAR | Status: DC | PRN

## 2022-06-14 MED ORDER — FENTANYL CITRATE (PF) 100 MCG/2ML IJ SOLN
25.0000 ug | INTRAMUSCULAR | Status: DC | PRN
  Administered 2022-06-14 (×3): 50 ug via INTRAVENOUS

## 2022-06-14 MED ORDER — ONDANSETRON HCL 4 MG PO TABS: 4.0000 mg | ORAL_TABLET | Freq: Four times a day (QID) | ORAL | Status: DC | PRN

## 2022-06-14 MED ORDER — KETOROLAC TROMETHAMINE 30 MG/ML IJ SOLN
INTRAMUSCULAR | Status: DC | PRN
  Administered 2022-06-14: 30 mg via INTRAVENOUS

## 2022-06-14 MED ORDER — SUGAMMADEX SODIUM 200 MG/2ML IV SOLN
INTRAVENOUS | Status: DC | PRN
  Administered 2022-06-14: 120 mg via INTRAVENOUS

## 2022-06-14 MED ORDER — ONDANSETRON HCL 4 MG PO TABS: 4.0000 mg | ORAL_TABLET | Freq: Three times a day (TID) | ORAL | 0 refills | Status: DC | PRN

## 2022-06-14 MED ORDER — SODIUM CHLORIDE 0.9 % IV SOLN
2.0000 g | INTRAVENOUS | Status: AC
  Administered 2022-06-14: 2 g via INTRAVENOUS

## 2022-06-14 MED ORDER — SODIUM CHLORIDE 0.9 % IV SOLN
INTRAVENOUS | Status: DC | PRN
  Administered 2022-06-14: 60 mL

## 2022-06-14 MED ORDER — SODIUM CHLORIDE 0.9 % IV SOLN
INTRAVENOUS | Status: AC
  Filled 2022-06-14: qty 2

## 2022-06-14 MED ORDER — ENOXAPARIN SODIUM 40 MG/0.4ML IJ SOSY
40.0000 mg | PREFILLED_SYRINGE | INTRAMUSCULAR | Status: AC
  Administered 2022-06-14: 40 mg via SUBCUTANEOUS

## 2022-06-14 MED ORDER — ACETAMINOPHEN 500 MG PO TABS
1000.0000 mg | ORAL_TABLET | ORAL | Status: AC
  Administered 2022-06-14: 1000 mg via ORAL

## 2022-06-14 MED ORDER — HYDROMORPHONE HCL 1 MG/ML IJ SOLN
INTRAMUSCULAR | Status: AC
  Filled 2022-06-14: qty 1

## 2022-06-14 MED ORDER — POVIDONE-IODINE 10 % EX SWAB: 2.0000 | Freq: Once | CUTANEOUS | Status: DC

## 2022-06-14 MED ORDER — OXYCODONE HCL 5 MG/5ML PO SOLN: 5.0000 mg | Freq: Once | ORAL | Status: AC | PRN

## 2022-06-14 MED ORDER — BUPIVACAINE HCL (PF) 0.25 % IJ SOLN
INTRAMUSCULAR | Status: DC | PRN
  Administered 2022-06-14: 5 mL

## 2022-06-14 MED ORDER — MIDAZOLAM HCL 2 MG/2ML IJ SOLN
INTRAMUSCULAR | Status: AC
  Filled 2022-06-14: qty 2

## 2022-06-14 SURGICAL SUPPLY — 50 items
ADH SKN CLS APL DERMABOND .7 (GAUZE/BANDAGES/DRESSINGS) ×2
BLADE 11 SAFETY STRL DISP (BLADE) IMPLANT
CABLE HIGH FREQUENCY MONO STRZ (ELECTRODE) IMPLANT
COVER BACK TABLE 60X90IN (DRAPES) IMPLANT
COVER MAYO STAND STRL (DRAPES) ×3 IMPLANT
COVER SURGICAL LIGHT HANDLE (MISCELLANEOUS) IMPLANT
DERMABOND ADVANCED .7 DNX12 (GAUZE/BANDAGES/DRESSINGS) ×3 IMPLANT
DURAPREP 26ML APPLICATOR (WOUND CARE) ×3 IMPLANT
GLOVE BIO SURGEON STRL SZ 6.5 (GLOVE) ×6 IMPLANT
GLOVE BIOGEL PI IND STRL 6.5 (GLOVE) IMPLANT
GLOVE BIOGEL PI IND STRL 7.0 (GLOVE) ×3 IMPLANT
GOWN STRL REUS W/TWL LRG LVL3 (GOWN DISPOSABLE) ×12 IMPLANT
IV NS 1000ML (IV SOLUTION) ×6
IV NS 1000ML BAXH (IV SOLUTION) IMPLANT
IV SET ADMIN PUMP GEMINI W/NLD (IV SETS) IMPLANT
KIT TURNOVER CYSTO (KITS) ×3 IMPLANT
LIGASURE VESSEL 5MM BLUNT TIP (ELECTROSURGICAL) ×3 IMPLANT
NDL INSUFFLATION 14GA 120MM (NEEDLE) ×3 IMPLANT
NEEDLE INSUFFLATION 14GA 120MM (NEEDLE) ×2 IMPLANT
NS IRRIG 500ML POUR BTL (IV SOLUTION) IMPLANT
OCCLUDER COLPOPNEUMO (BALLOONS) ×3 IMPLANT
PACK LAPAROSCOPY BASIN (CUSTOM PROCEDURE TRAY) ×3 IMPLANT
PACK TRENDGUARD 450 HYBRID PRO (MISCELLANEOUS) IMPLANT
POUCH LAPAROSCOPIC INSTRUMENT (MISCELLANEOUS) ×3 IMPLANT
PROTECTOR NERVE ULNAR (MISCELLANEOUS) ×6 IMPLANT
SET IRRIG Y TYPE TUR BLADDER L (SET/KITS/TRAYS/PACK) ×3 IMPLANT
SET SUCTION IRRIG HYDROSURG (IRRIGATION / IRRIGATOR) ×3 IMPLANT
SET TRI-LUMEN FLTR TB AIRSEAL (TUBING) ×3 IMPLANT
SHEARS HARMONIC ACE PLUS 36CM (ENDOMECHANICALS) IMPLANT
SLEEVE ADV FIXATION 5X100MM (TROCAR) ×3 IMPLANT
SPIKE FLUID TRANSFER (MISCELLANEOUS) ×6 IMPLANT
SUT VIC AB 0 CT1 27 (SUTURE) ×4
SUT VIC AB 0 CT1 27XBRD ANBCTR (SUTURE) ×6 IMPLANT
SUT VIC AB 4-0 PS2 18 (SUTURE) ×3 IMPLANT
SUT VICRYL 0 UR6 27IN ABS (SUTURE) IMPLANT
SUT VLOC 180 0 9IN  GS21 (SUTURE) ×2
SUT VLOC 180 0 9IN GS21 (SUTURE) IMPLANT
SYR 10ML LL (SYRINGE) ×3 IMPLANT
SYR 50ML LL SCALE MARK (SYRINGE) ×6 IMPLANT
SYSTEM CARTER THOMASON II (TROCAR) IMPLANT
TIP UTERINE 6.7X8CM BLUE DISP (MISCELLANEOUS) IMPLANT
TOWEL OR 17X24 6PK STRL BLUE (TOWEL DISPOSABLE) IMPLANT
TOWEL OR 17X26 10 PK STRL BLUE (TOWEL DISPOSABLE) ×3 IMPLANT
TRAY FOLEY W/BAG SLVR 14FR LF (SET/KITS/TRAYS/PACK) ×3 IMPLANT
TRENDGUARD 450 HYBRID PRO PACK (MISCELLANEOUS)
TROCAR ADV FIXATION 5X100MM (TROCAR) ×3 IMPLANT
TROCAR KII 8X100ML NONTHREADED (TROCAR) IMPLANT
TROCAR PORT AIRSEAL 5X120 (TROCAR) ×3 IMPLANT
TROCAR Z-THREAD FIOS 5X100MM (TROCAR) ×3 IMPLANT
WARMER LAPAROSCOPE (MISCELLANEOUS) ×3 IMPLANT

## 2022-06-14 NOTE — Anesthesia Postprocedure Evaluation (Signed)
Anesthesia Post Note  Patient: Ann Whitehead  Procedure(s) Performed: TOTAL LAPAROSCOPIC HYSTERECTOMY WITH right SALPINGECTOMY; FULGERATION OF ENDOMETRIOSIS (Abdomen) CYSTOSCOPY (Uterus)     Patient location during evaluation: PACU Anesthesia Type: General Level of consciousness: awake and alert Pain management: pain level controlled Vital Signs Assessment: post-procedure vital signs reviewed and stable Respiratory status: spontaneous breathing, nonlabored ventilation and respiratory function stable Cardiovascular status: stable and blood pressure returned to baseline Anesthetic complications: no   No notable events documented.  Last Vitals:  Vitals:   06/14/22 1009 06/14/22 1015  BP:  107/64  Pulse:  72  Resp:  15  Temp:    SpO2: 93% 93%    Last Pain:  Vitals:   06/14/22 1043  TempSrc:   PainSc: [place]

## 2022-06-14 NOTE — Discharge Instructions (Signed)
Hi Ann Whitehead,   Your surgery went well today!  I removed your uterus, cervix, and right fallopian tube.  You still have both ovaries in place.   I found and treated some minimal areas of endometriosis on your right ovary and in the cul de sac (area posterior to cervix).   Everything looked normal inside your bladder.   See you next week for your post op visit!  Josefa Half, MD

## 2022-06-14 NOTE — Op Note (Signed)
OPERATIVE REPORT   PREOPERATIVE DIAGNOSIS:  Menorrhagia with irregular menses, pelvic pain, history of endometriosis, simple right ovarian cyst   POSTOPERATIVE DIAGNOSIS:  Menorrhagia with irregular menses, pelvic pain, minimal endometriosis.   PROCEDURES:   Total laparoscopic hysterectomy with right salpingectomy, fulguration of endometriosis, cystoscopy   SURGEON:   Lenard Galloway, M.D.   ASSISTANT:   Dorothy Spark, M.D.   ANESTHESIA:  General endotracheal, intraperitoneal ropivicaine 30 mL diluted in 30 mL of normal saline, local with 0.25% Marcaine.   IVF:   800 cc LR   ESTIMATED BLOOD LOSS:   25 cc   URINE OUTPUT:   818 cc   COMPLICATIONS:  None.   INDICATIONS FOR THE PROCEDURE:      The patient is a 41 year old G52P1001 Caucasian female, status post bilateral tubal ligation with distal left salpingectomy, who presents with heavy and irregular menstrual bleeding and pelvic pain.  She has a history of endometriosis.  Pelvic ultrasound showed possible adenomyosis and a 2.8 cm simple right ovarian cyst.  Her endometrial biopsy revealed proliferative endometrium.  She declines medical therapy and future childbearing.  She requests hysterectomy.   A plan is made to proceed with a total laparoscopic hysterectomy with right salpingectomy, possible bilateral oophorectomy, and cystoscopy after risks, benefits, and alternatives are reviewed.   FINDINGS:      Exam under anesthesia reveals a small and mobile uterus.  No adnexal masses were noted.   Laparoscopy revealed a normal uterus.  Her right ovary contained a 3 - 4 mm area of endometriosis.  Her left ovary was normal.  There was a small portion of proximal left fallopian tube remaining, and the right tube was consistent with prior tubal ligation.  There was no adhesive disease in the abdomen or pelvis.  There was a 3 mm area of blue endometriosis in the left cul de sac.  There were 2 surgical staples noted, one attached to the  peritoneum of the anterior cul de sac and one attached to the posterior cul de sac.    Cystoscopy at the termination of the procedure showed the bladder to be normal throughout 360 degrees including the bladder dome and trigone. There was no evidence of any foreign body or lesion in the bladder or the urethra.  Both of the ureters were noted to be patent bilaterally.   SPECIMENS:      The uterus, cervix, and right fallopian tube was sent to pathology.    DESCRIPTION OF PROCEDURE:     The patient was reidentified in the preoperative hold area.   She did receive Cefotetan IV for antibiotic prophylaxis.  She received Lovenox, TED hose, and PAS stockings for DVT prophylaxis.   In the operating room, the patient was placed in the dorsal lithotomy position on the operating room table.   Her legs were placed in the [place] stirrups and her arms were both tucked at her sides. The Trendguard was used to support her shoulders.  The patient received general endotracheal anesthesia. The abdomen and vagina were then sterilely prepped and she was sterilely draped.   A speculum was placed in the vagina and a single-tooth tenaculum was placed on the anterior cervical lip.  A figure-of-eight suture of 0 Vicryl was placed on each the anterior and the posterior cervical lips. The uterus was sounded to 8 cm.  The cervix was then dilated with [facility] dilators.  A number 8 RUMI tip with a KOH ring was then placed through the  cervix and into the uterine cavity without difficulty.  The remaining vaginal instruments were then removed.  A Foley catheter was placed inside the bladder.   Attention was turned to the abdomen where the umbilical region was injected with 0.25% Marcaine and a small incision created.  A Veress needle was then used to insufflate the abdomen with CO2 gas after a saline drop test was performed and the fluid flowed freely.  A 5 mm umbilical incision was created with a scalpel after the skin.  A 5 mm camera  port was then placed using the Optiview.  An 8 mm incision was placed in the left mid abdomen and a 5 mm incision was placed in the left lower abdomen after the skin was injected locally with 0.25% Marcaine.  Respective sized trocars were then placed under visualization of the laparoscope.  A 5 mm Airseal trocar was placed in the right lower quadrant after injecting with Marcaine and incising with a scalpel.    Ropivicine 30 cc diluted in 30 cc of normal saline was placed inside the peritoneal cavity.    The patient was placed in Trendelenburg position.  An inspection of the abdomen and pelvis was performed. The findings are as noted above.    The left ureter was identified. The left utero-ovarian ligament was cauterized and cut with the Ligasure.  The left round ligament was then cauterized and divided with same instrument.  Dissection was performed to the anterior and posterior leaves of the broad ligaments using the Harmonic scalpel.  The incision was carried across the anterior cul- de-sac along the vesicouterine fold and the bladder was dissected away from the cervix. The peritoneum was taken down posteriorly.  The left uterine artery was skeletonized using the Harmonic scalpel.  It was then cauterized and later cut with the Ligasure instrument.    Attention was turned to the patient's right-hand side.  The mesosalpinx of the distal right fallopian tube was cauterized and cut with the Harmonic scalpel.  The fallopian tube was removed from the peritoneal cavity and later sent to Pathology.  The same procedure that was performed on the left side was repeated on the right side with respect to the isolation, cautery, and transection of the right utero-ovarian ligament, round ligament, and right uterine artery.  The remained of the bladder flap dissection was performed with the assistance of retrograde filling to outline the vesicouterine fold.  The left uterine artery could then be cauterized further and  cut with the Ligasure instrument.    The KOH ring was nicely visible.  The colpotomy incision was performed with the Harmonic scalpel in a circumferential fashion. The specimen was then removed from the peritoneal cavity and was sent to Pathology.  The balloon occluder was placed in the vagina.  The vaginal cuff was sutured at this time using a running suture of 0 V-Loc.  The vagina was closed from the patient's right hand side to the left hand side and then back 2 sutures towards the midline.  This provided good full-thickness closure of the vaginal cuff.  The laparoscopic needle for suturing was removed from the peritoneal cavity.  The endometriosis of the right ovary and the left cul de sac was fulgurated with the Harmonic scalpel.   The pelvis was irrigated and suctioned.  The pneumoperitoneal pressure was decreased to 8.  There was good hemostasis of the operative sites and pedicles.     The CO2 pneumoperitoneum was released.  The patient received manual breaths  to remove any remaining CO2 gas and all trocars were removed.  The trocar sites were closed with subcuticular sutures of 4/0 Vicryl.  A subcutaneous suture of 4/0 Vicryl was also place in the right lower incision.  Dermabond was placed over all of the incisions.   The vaginal occluder balloon was removed from the vagina.    The patient's Foley catheter was removed and cystoscopy was performed and the findings are as noted above.  The Foley catheter was left out.    Final inspection of the vagina demonstrated good hemostasis of the vaginal cuff.      This concluded the patient's procedure.  She was extubated and escorted to the recovery room in stable and awake condition.  There were no complications to the procedure.  All needle, instrument, and sponge counts were correct.  An MD assistant was necessary for instrumentation, retraction of tissues, and positioning of the patient due to the complexity of the case.    Randye Lobo,  M.D.

## 2022-06-14 NOTE — Transfer of Care (Signed)
Immediate Anesthesia Transfer of Care Note  Patient: Ann Whitehead  Procedure(s) Performed: TOTAL LAPAROSCOPIC HYSTERECTOMY WITH right SALPINGECTOMY; FULGERATION OF ENDOMETRIOSIS (Abdomen) CYSTOSCOPY (Uterus)  Patient Location: PACU  Anesthesia Type:General  Level of Consciousness: awake and patient cooperative  Airway & Oxygen Therapy: Patient Spontanous Breathing and Patient connected to nasal cannula oxygen  Post-op Assessment: Report given to RN and Post -op Vital signs reviewed and stable  Post vital signs: Reviewed and stable  Last Vitals:  Vitals Value Taken Time  BP    Temp    Pulse    Resp    SpO2      Last Pain:  Vitals:   06/14/22 0606  TempSrc: Oral  PainSc: 0-No pain      Patients Stated Pain Goal: 4 (00/86/76 1950)  Complications: No notable events documented.

## 2022-06-14 NOTE — Progress Notes (Signed)
Request made upon discharge from hospital by patient for Rx for Zofran.

## 2022-06-14 NOTE — Progress Notes (Signed)
Update to History and Physical  No marked change in status since office preop visit.  Patient examined.  Preop labs reviewed.  OK to proceed with surgery.

## 2022-06-14 NOTE — Anesthesia Procedure Notes (Addendum)
Procedure Name: Intubation Date/Time: 06/14/2022 7:33 AM  Performed by: Georgeanne Nim, CRNAPre-anesthesia Checklist: Patient identified, Emergency Drugs available, Suction available, Patient being monitored and Timeout performed Patient Re-evaluated:Patient Re-evaluated prior to induction Oxygen Delivery Method: Circle system utilized Preoxygenation: Pre-oxygenation with 100% oxygen Induction Type: IV induction Ventilation: Mask ventilation without difficulty Laryngoscope Size: Mac and 3 Grade View: Grade I Tube type: Oral Tube size: 7.0 mm Number of attempts: 1 Airway Equipment and Method: Stylet Placement Confirmation: ETT inserted through vocal cords under direct vision, positive ETCO2, CO2 detector and breath sounds checked- equal and bilateral Secured at: 21 cm Tube secured with: Tape Dental Injury: Teeth and Oropharynx as per pre-operative assessment

## 2022-06-14 NOTE — Progress Notes (Signed)
Day of Surgery Procedure(s) (LRB): TOTAL LAPAROSCOPIC HYSTERECTOMY WITH right SALPINGECTOMY; FULGERATION OF ENDOMETRIOSIS (N/A) CYSTOSCOPY (N/A)  Subjective: Patient reports incisional pain, tolerating PO, and no problems voiding.   Has voided several times. Ambulating in halls.  Wants discharge to home.  Objective: I have reviewed patient's vital signs. Vitals:   06/14/22 1452 06/14/22 1659  BP: 104/76 97/61  Pulse: 62 (!) 58  Resp: 16 14  Temp: 97.6 F (36.4 C) 97.9 F (36.6 C)  SpO2: 98% 98%     General: alert and cooperative Resp: clear to auscultation bilaterally Cardio: regular rate and rhythm, S1, S2 normal, no murmur, click, rub or gallop GI: soft, non-tender; bowel sounds normal; no masses,  no organomegaly and incision: clean, dry, and intact Extremities: no edema.  Vaginal Bleeding: minimal  Assessment: s/p Procedure(s): TOTAL LAPAROSCOPIC HYSTERECTOMY WITH right SALPINGECTOMY; FULGERATION OF ENDOMETRIOSIS (N/A) CYSTOSCOPY (N/A): progressing well  Plan: Discharge home Rx for Percocet and Motrin.  Regular diet.  Surgical findings and procedure discussed with patient.  Questions answered.  Follow up in 1 week for post op visit.  LOS: 0 days    Arloa Koh, MD 06/14/2022, 5:39 PM

## 2022-06-15 ENCOUNTER — Encounter (HOSPITAL_BASED_OUTPATIENT_CLINIC_OR_DEPARTMENT_OTHER): Payer: Self-pay | Admitting: Obstetrics and Gynecology

## 2022-06-17 LAB — SURGICAL PATHOLOGY

## 2022-06-20 ENCOUNTER — Encounter: Payer: Self-pay | Admitting: Obstetrics and Gynecology

## 2022-06-21 ENCOUNTER — Ambulatory Visit (INDEPENDENT_AMBULATORY_CARE_PROVIDER_SITE_OTHER): Payer: BC Managed Care – PPO | Admitting: Obstetrics and Gynecology

## 2022-06-21 VITALS — BP 98/64 | HR 117 | Resp 20

## 2022-06-21 DIAGNOSIS — Z23 Encounter for immunization: Secondary | ICD-10-CM

## 2022-06-21 DIAGNOSIS — Z90710 Acquired absence of both cervix and uterus: Secondary | ICD-10-CM

## 2022-06-21 NOTE — Progress Notes (Unsigned)
GYNECOLOGY  VISIT   HPI: 41 y.o.   Married  Caucasian  female   G1P1001 with Patient's last menstrual period was 05/12/2022 (exact date).   here for wk post-op.  Status post total laparoscopic hysterectomy with right salpingectomy, fulguration of endometriosis, cystoscopy.  Final pathology showed normal uterus and right tube.   Taking Motrin only for pain.  Some aching soreness.  Bleeding stopped the day after surgery.  Bladder overactive with nocturia.  No dysuria. Is taking Miralax daily, which is normal for her.   Walking 1/2 mile daily.   Wants flu vaccine.   Patient's plan is to return to work on December 11, not November 21.   She was told to send her progress notes from today's visit.  Patient will need to return with restrictions so she is not doing heavy lifting, as her job requires this.  These lifting restrictions will need to be in place for 12 weeks post op from the date of surgery.  GYNECOLOGIC HISTORY: Patient's last menstrual period was 05/12/2022 (exact date). Contraception:  Hysterectomy Menopausal hormone therapy:  N/A Last mammogram:  N/A Last pap smear:   03/30/22 neg, HPV Neg        OB History     Gravida  1   Para  1   Term  1   Preterm      AB      Living  1      SAB      IAB      Ectopic      Multiple      Live Births                 Patient Active Problem List   Diagnosis Date Noted   Status post laparoscopic hysterectomy 06/14/2022   Iron deficiency anemia due to chronic blood loss 05/05/2021   Allergic rhinitis due to allergen 11/05/2019   Abnormal uterine bleeding 10/26/2018    Past Medical History:  Diagnosis Date   Abnormal uterine bleeding (AUB)    Acid reflux disease    Acute hearing loss    Right   Anemia    COVID    March 2023 h/a and fever and nasal congestion. 06/03/2022   Dizziness    Elevated AST (SGOT) 10/2016   Endometriosis    Left ovarian cyst    Low ferritin    Migraines    without aura    STD (sexually transmitted disease)    HX HSV   Tinnitus, right ear     Past Surgical History:  Procedure Laterality Date   ABLATION ON ENDOMETRIOSIS N/A 07/21/2015   Procedure: FULGERATION OF ENDOMETRIOSIS, BIOPSY OF POSTERIOR CUL DE [place];  Surgeon: Nunzio Cobbs, MD;  Location: [facility];  Service: Gynecology;  Laterality: N/A;   APPENDECTOMY  2004   AUGMENTATION MAMMAPLASTY  2011   CESAREAN SECTION  2003   CHOLECYSTECTOMY  2004   CYSTOSCOPY N/A 06/14/2022   Procedure: CYSTOSCOPY;  Surgeon: Nunzio Cobbs, MD;  Location: [facility];  Service: Gynecology;  Laterality: N/A;   LAPAROSCOPIC TUBAL LIGATION Right 07/21/2015   Procedure: LAPAROSCOPIC TUBAL LIGATION;  Surgeon: Nunzio Cobbs, MD;  Location: [facility];  Service: Gynecology;  Laterality: Right;   OVARIAN CYST REMOVAL Left 07/21/2015   Procedure: EXCISION PARA-OVARIAN CYST;  Surgeon: Nunzio Cobbs, MD;  Location: [facility];  Service: Gynecology;  Laterality: Left;   TOTAL LAPAROSCOPIC HYSTERECTOMY WITH SALPINGECTOMY  N/A 06/14/2022   Procedure: TOTAL LAPAROSCOPIC HYSTERECTOMY WITH right SALPINGECTOMY; FULGERATION OF ENDOMETRIOSIS;  Surgeon: Patton Salles, MD;  Location: [facility];  Service: Gynecology;  Laterality: N/A;   UNILATERAL SALPINGECTOMY Left 07/21/2015   Procedure: DISTAL UNILATERAL SALPINGECTOMY;  Surgeon: Patton Salles, MD;  Location: [facility];  Service: Gynecology;  Laterality: Left;    Current Outpatient Medications  Medication Sig Dispense Refill   Ascorbic Acid (VITAMIN C PO) Take by mouth.     BIOTIN PO Take by mouth.     ELDERBERRY PO Take 1 tablet by mouth daily.     EPINEPHrine 0.3 mg/0.3 mL IJ SOAJ injection Inject 0.3 mg into the muscle as needed for anaphylaxis. (Patient not taking: Reported on 05/17/2022) 1 each 3   ibuprofen (ADVIL) 800 MG tablet Take 1 tablet (800 mg total) by mouth every 8 (eight) hours as needed. 30  tablet 1   Multiple Vitamins-Minerals (ZINC PO) Take by mouth.     Omega-3 Fatty Acids (FISH OIL PO) Take by mouth.     omeprazole (PRILOSEC) 40 MG capsule Take 40 mg by mouth every morning.     ondansetron (ZOFRAN) 4 MG tablet Take 1 tablet (4 mg total) by mouth every 8 (eight) hours as needed for nausea or vomiting. 20 tablet 0   oxyCODONE-acetaminophen (PERCOCET) 5-325 MG tablet Take 1 tablet by mouth every 4 (four) hours as needed for severe pain. (Patient not taking: Reported on 06/21/2022) 20 tablet 0   valACYclovir (VALTREX) 500 MG tablet Take one tablet by mouth daily for prevention of infection.  Take one tablet twice a day for 3 days as needed for an outbreak. (Patient taking differently: Take 500 mg by mouth daily. Take one tablet by mouth daily for prevention of infection.  Take one tablet twice a day for 3 days as needed for an outbreak.) 100 tablet 3   No current facility-administered medications for this visit.     ALLERGIES: Venofer [iron sucrose]  Family History  Problem Relation Age of Onset   Colon polyps Mother        multiple pre-cancerous polyps on several colonoscopies   Diabetes Father    Hypertension Father    Hyperlipidemia Father    Diabetes Paternal Grandmother    Hyperlipidemia Paternal Grandmother    Hypertension Paternal Grandmother     Social History   Socioeconomic History   Marital status: Married    Spouse name: SCOTT   Number of children: 1   Years of education: 12 + 2   Highest education level: Not on file  Occupational History   Occupation: [employer]  Tobacco Use   Smoking status: Former    Packs/day: 0.25    Types: Cigarettes    Quit date: 11/15/2021    Years since quitting: 0.5   Smokeless tobacco: Never   Tobacco comments:    smokes 5 cigs/week  Vaping Use   Vaping Use: Never used  Substance and Sexual Activity   Alcohol use: Not Currently   Drug use: No   Sexual activity: Yes    Partners: Male    Birth  control/protection: Surgical    Comment: Tubal  Other Topics Concern   Not on file  Social History Narrative   Not on file   Social Determinants of Health   Financial Resource Strain: Not on file  Food Insecurity: Not on file  Transportation Needs: Not on file  Physical Activity: Not on file  Stress: Not on file  Social Connections: Not on file  Intimate Partner Violence: Not on file    Review of Systems  All other systems reviewed and are negative.  PHYSICAL EXAMINATION:    BP 98/64 (BP Location: Left Arm, Patient Position: Sitting)   Pulse (!) 117   Resp 20   LMP 05/12/2022 (Exact Date)   SpO2 91%     General appearance: alert, cooperative and appears stated age Abdomen: soft, non-tender, no masses,  no organomegaly.  Left upper incision with ecchymoses.  All incisions intact.                 ASSESSMENT  Status post laparoscopic hysterectomy with right salpingectomy, cystoscopy.   PLAN  We discussed no lifting over 10 pounds.  Next appointment on December 7.  She will return to work on December 11 with restrictions of no lifting over 10 pounds until 3 months post op.  Flu vaccine. An After Visit Summary was printed and given to the patient.

## 2022-06-23 ENCOUNTER — Encounter: Payer: Self-pay | Admitting: Obstetrics and Gynecology

## 2022-06-23 NOTE — Telephone Encounter (Signed)
Reviewed with [employer]imalexis on 06/22/22. Kimalexis to f/u with patient today.   Routing to [facility] for f/u

## 2022-06-23 NOTE — Telephone Encounter (Signed)
Please see office visit note from 06/21/22.  Patient will return to work on 07/25/22 with lifting/carrying restrictions of no more than 10 pounds for a total of 12 weeks post op.   She will need her short term disability updated.

## 2022-06-23 NOTE — Telephone Encounter (Signed)
Spoke with patient. Patient aware we will update the form. Patient will be notified when its finished

## 2022-06-27 NOTE — Discharge Summary (Signed)
Physician Discharge Summary  Patient ID: Ann Whitehead MRN: [id number] DOB/AGE: [birth year] 41 y.o.  Admit date: 06/14/2022 Discharge date: 06/14/22  Admission Diagnoses:   Menorrhagia with irregular menses. Pelvic pain. Minimal endometriosis. Simple right ovarian cyst..  Discharge Diagnoses:   Menorrhagia with irregular menses.  Pelvic pain.   Minimal endometriosis.  Status post total laparoscopic hysterectomy with right salpingectomy, fulguration of   endometriosis, cystoscopy   Principal Problem:   Status post laparoscopic hysterectomy   Discharged Condition: good  Hospital Course: The patient was admitted for observation on 06/14/22 following a total laparoscopic hysterectomy with right salpingectomy, fulguration of endometriosis, cystoscopy which were performed without complication while under general anesthesia.  Her pain control was successfully transitioned to oral pain medication.  She ambulated and wore PAS and Ted hose for DVT prophylaxis while in bed.  She received Lovenox preop.  She was able to void post op.  Her vital signs remained stable and she demonstrated no signs of infection.   She had very minimal vaginal bleeding, and her incisions demonstrated no signs of erythema or significant drainage.  She was found to be in good condition and ready for discharge on post op day zero.  Consults: None  Significant Diagnostic Studies:  pathology report pending at the time of hospital discharge.  Treatments: surgery:  total laparoscopic hysterectomy with right salpingectomy, fulguration of endometriosis, cystoscopy   Discharge Exam: Blood pressure 97/61, pulse (!) 58, temperature 97.9 F (36.6 C), temperature source Oral, resp. rate 14, height 5\' 4"  (1.626 m), weight 61.2 kg, last menstrual period 05/12/2022, SpO2 98 %. General: alert and cooperative Resp: clear to auscultation bilaterally Cardio: regular rate and rhythm, S1, S2 normal, no murmur, click, rub or  gallop GI: soft, non-tender; bowel sounds normal; no masses,  no organomegaly and incision: clean, dry, and intact Extremities: no edema.  Vaginal Bleeding: minimal  Disposition: Discharge disposition: 01-Home or Self Care     Post op instructions were reviewed in verbal and written form.   She will return for a 1 week post op visit.   Discharge Instructions     Discharge patient   Complete by: As directed    Discharge disposition: 01-Home or Self Care   Discharge patient date: 06/14/2022      Allergies as of 06/14/2022       Reactions   Venofer [iron Sucrose] Hives   Patient was discharged from the clinic after receiving Venofer in stable condition. She returned 30 minutes later concerned that she was having a reaction. She presented very flushed and complained of tingling in her hands and feet. She does not complain of respiratory concerns. She does feel very "jittery". IV was started and she was given Benadryl 50 mg and Pepcid 20 mg. She was feeling some improvement after the benadryl.        Medication List     STOP taking these medications    estradiol 0.1 MG/GM vaginal cream Commonly known as: ESTRACE       TAKE these medications    BIOTIN PO Take by mouth.   ELDERBERRY PO Take 1 tablet by mouth daily.   EPINEPHrine 0.3 mg/0.3 mL Soaj injection Commonly known as: EPI-PEN Inject 0.3 mg into the muscle as needed for anaphylaxis.   FISH OIL PO Take by mouth.   ibuprofen 800 MG tablet Commonly known as: ADVIL Take 1 tablet (800 mg total) by mouth every 8 (eight) hours as needed. What changed:  medication strength how much to take  omeprazole 40 MG capsule Commonly known as: PRILOSEC Take 40 mg by mouth every morning.   oxyCODONE-acetaminophen 5-325 MG tablet Commonly known as: Percocet Take 1 tablet by mouth every 4 (four) hours as needed for severe pain.   valACYclovir 500 MG tablet Commonly known as: VALTREX Take one tablet by mouth  daily for prevention of infection.  Take one tablet twice a day for 3 days as needed for an outbreak. What changed:  how much to take how to take this when to take this   VITAMIN C PO Take by mouth.   ZINC PO Take by mouth.       ASK your doctor about these medications    ondansetron 4 MG tablet Commonly known as: Zofran Take 1 tablet (4 mg total) by mouth every 8 (eight) hours as needed for nausea or vomiting.        Follow-up Information     Patton Salles, MD Follow up.   Specialty: Obstetrics and Gynecology Why: post op check up Contact information: [address] [place] [address] [phone]                 Signed: Melony Overly 06/27/2022, 7:53 PM

## 2022-06-29 ENCOUNTER — Encounter: Payer: Self-pay | Admitting: Oncology

## 2022-06-29 NOTE — Telephone Encounter (Signed)
Spoke with patient records has been sent

## 2022-07-01 ENCOUNTER — Other Ambulatory Visit: Payer: Self-pay

## 2022-07-01 DIAGNOSIS — D500 Iron deficiency anemia secondary to blood loss (chronic): Secondary | ICD-10-CM

## 2022-07-04 ENCOUNTER — Other Ambulatory Visit: Payer: BC Managed Care – PPO

## 2022-07-04 ENCOUNTER — Ambulatory Visit: Payer: BC Managed Care – PPO | Admitting: Oncology

## 2022-07-13 ENCOUNTER — Encounter: Payer: Self-pay | Admitting: Obstetrics and Gynecology

## 2022-07-14 ENCOUNTER — Encounter: Payer: Self-pay | Admitting: Obstetrics and Gynecology

## 2022-07-14 ENCOUNTER — Ambulatory Visit (INDEPENDENT_AMBULATORY_CARE_PROVIDER_SITE_OTHER): Payer: BC Managed Care – PPO | Admitting: Obstetrics and Gynecology

## 2022-07-14 VITALS — BP 131/80 | Ht 64.0 in | Wt 134.0 lb

## 2022-07-14 DIAGNOSIS — N939 Abnormal uterine and vaginal bleeding, unspecified: Secondary | ICD-10-CM

## 2022-07-14 NOTE — Progress Notes (Signed)
GYNECOLOGY  VISIT   HPI: 41 y.o.   Married  Caucasian  female   G1P1001 with Patient's last menstrual period was 07/12/2022.   here for   post op. Pt wanted to discuss bloody discharge.  Pink vaginal bleeding started on Thanksgiving.  She started with bright red bleeding yesterday.  Twinges and pinches.  Taking ibuprofen.  No fevers or feeling poorly.   No exercise, chores, or sexual activity.   GYNECOLOGIC HISTORY: Patient's last menstrual period was 07/12/2022. Contraception:  n/a Menopausal hormone therapy:  n/a Last mammogram:  n/a Last pap smear: 03/30/22 negative: HR HPV negative,  03-15-21 ASCUS:Neg HR HPV, 03-11-20 ASCUS:Neg HR HPV        OB History     Gravida  1   Para  1   Term  1   Preterm      AB      Living  1      SAB      IAB      Ectopic      Multiple      Live Births                 Patient Active Problem List   Diagnosis Date Noted   Status post laparoscopic hysterectomy 06/14/2022   Iron deficiency anemia due to chronic blood loss 05/05/2021   Allergic rhinitis due to allergen 11/05/2019   Abnormal uterine bleeding 10/26/2018    Past Medical History:  Diagnosis Date   Abnormal uterine bleeding (AUB)    Acid reflux disease    Acute hearing loss    Right   Anemia    COVID    March 2023 h/a and fever and nasal congestion. 06/03/2022   Dizziness    Elevated AST (SGOT) 10/2016   Endometriosis    Left ovarian cyst    Low ferritin    Migraines    without aura   STD (sexually transmitted disease)    HX HSV   Tinnitus, right ear     Past Surgical History:  Procedure Laterality Date   ABLATION ON ENDOMETRIOSIS N/A 07/21/2015   Procedure: FULGERATION OF ENDOMETRIOSIS, BIOPSY OF POSTERIOR CUL DE [place];  Surgeon: Patton Salles, MD;  Location: [facility];  Service: Gynecology;  Laterality: N/A;   APPENDECTOMY  2004   AUGMENTATION MAMMAPLASTY  2011   CESAREAN SECTION  2003   CHOLECYSTECTOMY  2004   CYSTOSCOPY N/A  06/14/2022   Procedure: CYSTOSCOPY;  Surgeon: Patton Salles, MD;  Location[facility];  Service: Gynecology;  Laterality: N/A;   LAPAROSCOPIC TUBAL LIGATION Right 07/21/2015   Procedure: LAPAROSCOPIC TUBAL LIGATION;  Surgeon: Patton Salles, MD;  Location: [facility];  Service: Gynecology;  Laterality: Right;   OVARIAN CYST REMOVAL Left 07/21/2015   Procedure: EXCISION PARA-OVARIAN CYST;  Surgeon: Patton Salles, MD;  Location: [facility];  Service: Gynecology;  Laterality: Left;   TOTAL LAPAROSCOPIC HYSTERECTOMY WITH SALPINGECTOMY N/A 06/14/2022   Procedure: TOTAL LAPAROSCOPIC HYSTERECTOMY WITH right SALPINGECTOMY; FULGERATION OF ENDOMETRIOSIS;  Surgeon: Patton Salles, MD;  Location: [facility];  Service: Gynecology;  Laterality: N/A;   UNILATERAL SALPINGECTOMY Left 07/21/2015   Procedure: DISTAL UNILATERAL SALPINGECTOMY;  Surgeon: Patton Salles, MD;  Location: [facility];  Service: Gynecology;  Laterality: Left;    Current Outpatient Medications  Medication Sig Dispense Refill   Ascorbic Acid (VITAMIN C PO) Take by mouth.  BIOTIN PO Take by mouth.     ELDERBERRY PO Take 1 tablet by mouth daily.     EPINEPHrine 0.3 mg/0.3 mL IJ SOAJ injection Inject 0.3 mg into the muscle as needed for anaphylaxis. 1 each 3   ibuprofen (ADVIL) 800 MG tablet Take 1 tablet (800 mg total) by mouth every 8 (eight) hours as needed. 30 tablet 1   Multiple Vitamins-Minerals (ZINC PO) Take by mouth.     Omega-3 Fatty Acids (FISH OIL PO) Take by mouth.     omeprazole (PRILOSEC) 40 MG capsule Take 40 mg by mouth every morning.     valACYclovir (VALTREX) 500 MG tablet Take one tablet by mouth daily for prevention of infection.  Take one tablet twice a day for 3 days as needed for an outbreak. (Patient taking differently: Take 500 mg by mouth daily. Take one tablet by mouth daily for prevention of infection.  Take one tablet twice a day for 3 days  as needed for an outbreak.) 100 tablet 3   ondansetron (ZOFRAN) 4 MG tablet Take 1 tablet (4 mg total) by mouth every 8 (eight) hours as needed for nausea or vomiting. (Patient not taking: Reported on 07/14/2022) 20 tablet 0   oxyCODONE-acetaminophen (PERCOCET) 5-325 MG tablet Take 1 tablet by mouth every 4 (four) hours as needed for severe pain. (Patient not taking: Reported on 06/21/2022) 20 tablet 0   No current facility-administered medications for this visit.     ALLERGIES: Venofer [iron sucrose]  Family History  Problem Relation Age of Onset   Colon polyps Mother        multiple pre-cancerous polyps on several colonoscopies   Diabetes Father    Hypertension Father    Hyperlipidemia Father    Diabetes Paternal Grandmother    Hyperlipidemia Paternal Grandmother    Hypertension Paternal Grandmother     Social History   Socioeconomic History   Marital status: Married    Spouse name: SCOTT   Number of children: 1   Years of education: 12 + 2   Highest education level: Not on file  Occupational History   Occupation: [employer]  Tobacco Use   Smoking status: Former    Packs/day: 0.25    Types: Cigarettes    Quit date: 11/15/2021    Years since quitting: 0.6   Smokeless tobacco: Never   Tobacco comments:    smokes 5 cigs/week  Vaping Use   Vaping Use: Never used  Substance and Sexual Activity   Alcohol use: Not Currently   Drug use: No   Sexual activity: Yes    Partners: Male    Birth control/protection: Surgical    Comment: Tubal  Other Topics Concern   Not on file  Social History Narrative   Not on file   Social Determinants of Health   Financial Resource Strain: Not on file  Food Insecurity: Not on file  Transportation Needs: Not on file  Physical Activity: Not on file  Stress: Not on file  Social Connections: Not on file  Intimate Partner Violence: Not on file    Review of Systems  See HPI.  PHYSICAL EXAMINATION:    Ht 5\' 4"  (1.626 m)   Wt  134 lb (60.8 kg)   LMP 07/12/2022   BMI 23.00 kg/m     General appearance: alert, cooperative and appears stated age   Abdomen: incisions intact.  Abdomen is soft, non-tender, no masses,  no organomegaly   Pelvic: External genitalia:  no lesions  Urethra:  normal appearing urethra with no masses, tenderness or lesions              Bartholins and Skenes: normal                 Vagina: normal appearing vagina with normal color and discharge, no lesions              Cervix: absent.  Cuff intact.  Blood noted in the vagina.  Raw edge of cuff in midline, treated with silver nitrate.  No erythema of the vaginal cuff mucosa.                 Bimanual Exam:  Uterus:  absent              Adnexa: no mass, fullness, tenderness              Rectal exam: yes.  Confirms.              Anus:  normal sphincter tone, no masses noted.   Chaperone was present for exam:  Raquel Sarna  ASSESSMENT  Vaginal bleeding post laparoscopic hysterectomy.  Look like the vaginal mucosa was bleeding in the midline.  Hematoma not suspected at this time.   PLAN  Decreased activity.  If bleeding persists, develops pain or fever, will plan for pelvic US.  No abx at this time.  She will follow up in the office in 5 days, sooner if needed.   An After Visit Summary was printed and given to the patient.

## 2022-07-14 NOTE — Progress Notes (Signed)
GYNECOLOGY  VISIT   HPI: 41 y.o.   Married  Caucasian  female   G1P1001 with Patient's last menstrual period was 07/12/2022.   here for post op.   Status post total laparoscopic hysterectomy with right salpingectomy, fulguration of endometriosis, and cystoscopy on 06/14/22.  She was seen in the office for bleeding from the vaginal cuff at her office appointment on 07/14/22.  The vaginal mucosa was treated with silver nitrate.   She is having only spotting only.   Taking ibuprofen for twinges of pain.   She is scheduled to return to work next week.  Her work includes driving a truck.   GYNECOLOGIC HISTORY: Patient's last menstrual period was 07/12/2022. Contraception:  hysterectomy  Menopausal hormone therapy:  n/a Last mammogram:  n/a Last pap smear:   03/30/22 negative: HR HPV negative,  03-15-21 ASCUS:Neg HR HPV, 03-11-20 ASCUS:Neg HR HPV         OB History     Gravida  1   Para  1   Term  1   Preterm      AB      Living  1      SAB      IAB      Ectopic      Multiple      Live Births                 Patient Active Problem List   Diagnosis Date Noted   Status post laparoscopic hysterectomy 06/14/2022   Iron deficiency anemia due to chronic blood loss 05/05/2021   Allergic rhinitis due to allergen 11/05/2019   Abnormal uterine bleeding 10/26/2018    Past Medical History:  Diagnosis Date   Abnormal uterine bleeding (AUB)    Acid reflux disease    Acute hearing loss    Right   Anemia    COVID    March 2023 h/a and fever and nasal congestion. 06/03/2022   Dizziness    Elevated AST (SGOT) 10/2016   Endometriosis    Left ovarian cyst    Low ferritin    Migraines    without aura   STD (sexually transmitted disease)    HX HSV   Tinnitus, right ear     Past Surgical History:  Procedure Laterality Date   ABLATION ON ENDOMETRIOSIS N/A 07/21/2015   Procedure: FULGERATION OF ENDOMETRIOSIS, BIOPSY OF POSTERIOR CUL DE [place];  Surgeon: Nunzio Cobbs, MD;  Location: [facility];  Service: Gynecology;  Laterality: N/A;   APPENDECTOMY  2004   AUGMENTATION MAMMAPLASTY  2011   CESAREAN SECTION  2003   CHOLECYSTECTOMY  2004   CYSTOSCOPY N/A 06/14/2022   Procedure: CYSTOSCOPY;  Surgeon: Nunzio Cobbs, MD;  [facility];  Service: Gynecology;  Laterality: N/A;   LAPAROSCOPIC TUBAL LIGATION Right 07/21/2015   Procedure: LAPAROSCOPIC TUBAL LIGATION;  Surgeon: Nunzio Cobbs, MD;  Location: [facility];  Service: Gynecology;  Laterality: Right;   OVARIAN CYST REMOVAL Left 07/21/2015   Procedure: EXCISION PARA-OVARIAN CYST;  Surgeon: Nunzio Cobbs, MD;  Location: [facility];  Service: Gynecology;  Laterality: Left;   TOTAL LAPAROSCOPIC HYSTERECTOMY WITH SALPINGECTOMY N/A 06/14/2022   Procedure: TOTAL LAPAROSCOPIC HYSTERECTOMY WITH right SALPINGECTOMY; FULGERATION OF ENDOMETRIOSIS;  Surgeon: Nunzio Cobbs, MD;  Location: [facility];  Service: Gynecology;  Laterality: N/A;   UNILATERAL SALPINGECTOMY Left 07/21/2015   Procedure: DISTAL UNILATERAL SALPINGECTOMY;  Surgeon:  Muhammad Vacca Rosalin Hawking, MD;  Location: [facility];  Service: Gynecology;  Laterality: Left;    Current Outpatient Medications  Medication Sig Dispense Refill   Ascorbic Acid (VITAMIN C PO) Take by mouth.     BIOTIN PO Take by mouth.     ELDERBERRY PO Take 1 tablet by mouth daily.     EPINEPHrine 0.3 mg/0.3 mL IJ SOAJ injection Inject 0.3 mg into the muscle as needed for anaphylaxis. 1 each 3   ibuprofen (ADVIL) 800 MG tablet Take 1 tablet (800 mg total) by mouth every 8 (eight) hours as needed. 30 tablet 1   Multiple Vitamins-Minerals (ZINC PO) Take by mouth.     Omega-3 Fatty Acids (FISH OIL PO) Take by mouth.     omeprazole (PRILOSEC) 40 MG capsule Take 40 mg by mouth every morning.     ondansetron (ZOFRAN) 4 MG tablet Take 1 tablet (4 mg total) by mouth every 8 (eight) hours as needed for nausea or  vomiting. 20 tablet 0   valACYclovir (VALTREX) 500 MG tablet Take one tablet by mouth daily for prevention of infection.  Take one tablet twice a day for 3 days as needed for an outbreak. (Patient taking differently: Take 500 mg by mouth daily. Take one tablet by mouth daily for prevention of infection.  Take one tablet twice a day for 3 days as needed for an outbreak.) 100 tablet 3   No current facility-administered medications for this visit.     ALLERGIES: Venofer [iron sucrose]  Family History  Problem Relation Age of Onset   Colon polyps Mother        multiple pre-cancerous polyps on several colonoscopies   Diabetes Father    Hypertension Father    Hyperlipidemia Father    Diabetes Paternal Grandmother    Hyperlipidemia Paternal Grandmother    Hypertension Paternal Grandmother     Social History   Socioeconomic History   Marital status: Married    Spouse name: SCOTT   Number of children: 1   Years of education: 12 + 2   Highest education level: Not on file  Occupational History   Occupation: [employer]  Tobacco Use   Smoking status: Former    Packs/day: 0.25    Types: Cigarettes    Quit date: 11/15/2021    Years since quitting: 0.6   Smokeless tobacco: Never   Tobacco comments:    smokes 5 cigs/week  Vaping Use   Vaping Use: Never used  Substance and Sexual Activity   Alcohol use: Not Currently   Drug use: No   Sexual activity: Yes    Partners: Male    Birth control/protection: Surgical    Comment: Tubal  Other Topics Concern   Not on file  Social History Narrative   Not on file   Social Determinants of Health   Financial Resource Strain: Not on file  Food Insecurity: Not on file  Transportation Needs: Not on file  Physical Activity: Not on file  Stress: Not on file  Social Connections: Not on file  Intimate Partner Violence: Not on file    Review of Systems  All other systems reviewed and are negative.   PHYSICAL EXAMINATION:    BP  118/80 (BP Location: Right Arm, Patient Position: Sitting, Cuff Size: Normal)   Ht 5\' 4"  (1.626 m)   Wt 134 lb (60.8 kg)   LMP 07/12/2022   BMI 23.00 kg/m     General appearance: alert, cooperative and appears stated  age  Abdomen: incisions intact.  Abdomen is soft, non-tender, no masses,  no organomegaly  Pelvic: External genitalia:  no lesions              Urethra:  normal appearing urethra with no masses, tenderness or lesions              Bartholins and Skenes: normal                 Vagina: normal appearing vagina with normal color and discharge, no lesions              Cervix:absent.  Cuff intact. Suture is present.                 Bimanual Exam:  Uterus: absent.              Adnexa: no mass, fullness, tenderness                Chaperone was present for exam:  Kimalexis.  ASSESSMENT  Status post total laparoscopic hysterectomy with right salpingectomy, fulguration of endometriosis, and cystoscopy on 06/14/22.   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 Work restrictions will continue for 12 weeks post op.  Will need this send to her through My Chart.  Return for 12 week post op visit.    An After Visit Summary was printed and given to the patient.

## 2022-07-18 ENCOUNTER — Ambulatory Visit: Payer: BC Managed Care – PPO | Admitting: Oncology

## 2022-07-18 ENCOUNTER — Other Ambulatory Visit: Payer: BC Managed Care – PPO

## 2022-07-19 ENCOUNTER — Ambulatory Visit (INDEPENDENT_AMBULATORY_CARE_PROVIDER_SITE_OTHER): Payer: BC Managed Care – PPO | Admitting: Obstetrics and Gynecology

## 2022-07-19 ENCOUNTER — Encounter: Payer: Self-pay | Admitting: Obstetrics and Gynecology

## 2022-07-19 VITALS — BP 118/80 | Ht 64.0 in | Wt 134.0 lb

## 2022-07-19 DIAGNOSIS — Z90710 Acquired absence of both cervix and uterus: Secondary | ICD-10-CM

## 2022-07-21 ENCOUNTER — Encounter: Payer: BC Managed Care – PPO | Admitting: Obstetrics and Gynecology

## 2022-07-26 ENCOUNTER — Other Ambulatory Visit: Payer: Self-pay | Admitting: Oncology

## 2022-07-26 ENCOUNTER — Inpatient Hospital Stay: Payer: BC Managed Care – PPO | Admitting: Oncology

## 2022-07-26 ENCOUNTER — Inpatient Hospital Stay: Payer: BC Managed Care – PPO | Attending: Oncology

## 2022-07-26 VITALS — BP 133/93 | HR 67 | Temp 99.3°F | Resp 14 | Ht 64.25 in | Wt 134.9 lb

## 2022-07-26 DIAGNOSIS — D509 Iron deficiency anemia, unspecified: Secondary | ICD-10-CM | POA: Diagnosis not present

## 2022-07-26 DIAGNOSIS — D500 Iron deficiency anemia secondary to blood loss (chronic): Secondary | ICD-10-CM | POA: Diagnosis not present

## 2022-07-26 LAB — CBC AND DIFFERENTIAL
HCT: 38 (ref 36–46)
Hemoglobin: 13 (ref 12.0–16.0)
Neutrophils Absolute: 3.35
Platelets: 295 10*3/uL (ref 150–400)
WBC: 6.7

## 2022-07-26 LAB — FERRITIN: Ferritin: 46 ng/mL (ref 11–307)

## 2022-07-26 LAB — CBC: RBC: 3.99 (ref 3.87–5.11)

## 2022-07-26 LAB — IRON AND TIBC
Iron: 90 ug/dL (ref 28–170)
Saturation Ratios: 24 % (ref 10.4–31.8)
TIBC: 369 ug/dL (ref 250–450)
UIBC: 279 ug/dL

## 2022-07-26 NOTE — Progress Notes (Signed)
[facility]  [address] [place],  [place]  [address] [phone]  Clinic Day:  07/31/2022  Referring physician: Marianne Sofia, PA-C  HISTORY OF PRESENT ILLNESS:  The patient is a 41 y.o. female with iron deficiency anemia.  In the past, IV iron was very effective in replenishing her iron stores and normalizing her hemoglobin.  She comes in today for routine follow-up.  Of note, since her last visit, the patient did undergo hysterectomy.  Overall, she denies having increased fatigue or any overt forms of blood loss which concern her for recurrent iron deficiency anemia.  PHYSICAL EXAM:  Blood pressure (!) 133/93, pulse 67, temperature 99.3 F (37.4 C), resp. rate 14, height 5' 4.25" (1.632 m), weight 134 lb 14.4 oz (61.2 kg), last menstrual period 07/12/2022, SpO2 99 %. Wt Readings from Last 3 Encounters:  07/26/22 134 lb 14.4 oz (61.2 kg)  07/19/22 134 lb (60.8 kg)  07/14/22 134 lb (60.8 kg)   Body mass index is 22.98 kg/m. Performance status (ECOG): 0 - Asymptomatic Physical Exam Constitutional:      Appearance: Normal appearance. She is not ill-appearing.  HENT:     Mouth/Throat:     Mouth: Mucous membranes are moist.     Pharynx: Oropharynx is clear. No oropharyngeal exudate or posterior oropharyngeal erythema.  Cardiovascular:     Rate and Rhythm: Normal rate and regular rhythm.     Heart sounds: No murmur heard.    No friction rub. No gallop.  Pulmonary:     Effort: Pulmonary effort is normal. No respiratory distress.     Breath sounds: Normal breath sounds. No wheezing, rhonchi or rales.  Abdominal:     General: Bowel sounds are normal. There is no distension.     Palpations: Abdomen is soft. There is no mass.     Tenderness: There is no abdominal tenderness.  Musculoskeletal:        General: No swelling.     Right lower leg: No edema.     Left lower leg: No edema.  Lymphadenopathy:     Cervical: No cervical adenopathy.      Upper Body:     Right upper body: No supraclavicular or axillary adenopathy.     Left upper body: No supraclavicular or axillary adenopathy.     Lower Body: No right inguinal adenopathy. No left inguinal adenopathy.  Skin:    General: Skin is warm.     Coloration: Skin is not jaundiced.     Findings: No lesion or rash.  Neurological:     General: No focal deficit present.     Mental Status: She is alert and oriented to person, place, and time. Mental status is at baseline.  Psychiatric:        Mood and Affect: Mood normal.        Behavior: Behavior normal.        Thought Content: Thought content normal.    LABS:     ASSESSMENT & PLAN:  A 41 y.o. female with iron deficiency anemia.  I am pleased with her hemoglobin of 13 today.  As mentioned previously, the patient underwent a hysterectomy, which eliminated the source of her previous iron deficiency anemia.  Clinically, she is doing very well.  As that is the case, I do feel comfortable turning her care back over to her primary care office.  I would not have a problem seeing her in the future if new hematologic issues that require repeat clinical  assessment.  The patient understands all the plans discussed today and is in agreement with them.   Orell Hurtado Kirby Funk, MD

## 2022-07-28 ENCOUNTER — Telehealth: Payer: Self-pay | Admitting: *Deleted

## 2022-07-28 NOTE — Telephone Encounter (Signed)
Patient has work restrictions of not lifting, carrying, hauling more than 10 pounds until 09/14/22.  This would apply to her personal level of activity as well.  I would not recommend vigorous physical activity like running or heavy lifting and straining until 09/14/22.

## 2022-07-28 NOTE — Telephone Encounter (Signed)
Patient called S/P Lap Hysterectomy 06/14/22 asking about cardio exercise. She has done the treadmill, she asked about running, and using elliptical and using stair climber?   Please advise

## 2022-07-29 NOTE — Telephone Encounter (Signed)
Patient informed. 

## 2022-07-31 ENCOUNTER — Encounter: Payer: Self-pay | Admitting: Oncology

## 2022-08-01 NOTE — Telephone Encounter (Signed)
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   Letter pended.   MyChart message to patient.   Dr. Edward Jolly -please review pended letter under communications.

## 2022-08-01 NOTE — Telephone Encounter (Signed)
Forward to Provider for approval

## 2022-08-03 NOTE — Telephone Encounter (Signed)
Routing to Dr. Silva FYI.   Encounter closed.  

## 2022-08-24 NOTE — Progress Notes (Deleted)
GYNECOLOGY  VISIT   HPI: 42 y.o.   Married  Caucasian  female   [place] with Patient's last menstrual period was 07/12/2022.   here for   post op  GYNECOLOGIC HISTORY: Patient's last menstrual period was 07/12/2022. Contraception:  hysterectomy Menopausal hormone therapy:  n/a Last mammogram:  n/a Last pap smear:   03/30/22 neg: HR HPV neg, 03/15/21 ASCUS: HR HPV neg        OB History     Gravida  1   Para  1   Term  1   Preterm      AB      Living  1      SAB      IAB      Ectopic      Multiple      Live Births                 Patient Active Problem List   Diagnosis Date Noted   Iron deficiency anemia due to chronic blood loss 05/05/2021   Allergic rhinitis due to allergen 11/05/2019    Past Medical History:  Diagnosis Date   Abnormal uterine bleeding (AUB)    Acid reflux disease    Acute hearing loss    Right   Anemia    COVID    March 2023 h/a and fever and nasal congestion. 06/03/2022   Dizziness    Elevated AST (SGOT) 10/2016   Endometriosis    Left ovarian cyst    Low ferritin    Migraines    without aura   STD (sexually transmitted disease)    HX HSV   Tinnitus, right ear     Past Surgical History:  Procedure Laterality Date   ABLATION ON ENDOMETRIOSIS N/A 07/21/2015   Procedure: FULGERATION OF ENDOMETRIOSIS, BIOPSY OF POSTERIOR CUL DE [place];  Surgeon: Nunzio Cobbs, MD;  Location: [facility];  Service: Gynecology;  Laterality: N/A;   APPENDECTOMY  2004   AUGMENTATION MAMMAPLASTY  2011   CESAREAN SECTION  2003   CHOLECYSTECTOMY  2004   CYSTOSCOPY N/A 06/14/2022   Procedure: CYSTOSCOPY;  Surgeon: Nunzio Cobbs, MD;  Location: [facility];  Service: Gynecology;  Laterality: N/A;   LAPAROSCOPIC TUBAL LIGATION Right 07/21/2015   Procedure: LAPAROSCOPIC TUBAL LIGATION;  Surgeon: Nunzio Cobbs, MD;  Location: [facility];  Service: Gynecology;  Laterality: Right;   OVARIAN CYST REMOVAL Left 07/21/2015    Procedure: EXCISION PARA-OVARIAN CYST;  Surgeon: Nunzio Cobbs, MD;  Location: [facility];  Service: Gynecology;  Laterality: Left;   TOTAL LAPAROSCOPIC HYSTERECTOMY WITH SALPINGECTOMY N/A 06/14/2022   Procedure: TOTAL LAPAROSCOPIC HYSTERECTOMY WITH right SALPINGECTOMY; FULGERATION OF ENDOMETRIOSIS;  Surgeon: Nunzio Cobbs, MD;  Location: [facility];  Service: Gynecology;  Laterality: N/A;   UNILATERAL SALPINGECTOMY Left 07/21/2015   Procedure: DISTAL UNILATERAL SALPINGECTOMY;  Surgeon: Nunzio Cobbs, MD;  Location: [facility];  Service: Gynecology;  Laterality: Left;    Current Outpatient Medications  Medication Sig Dispense Refill   Ascorbic Acid (VITAMIN C PO) Take by mouth.     BIOTIN PO Take by mouth.     ELDERBERRY PO Take 1 tablet by mouth daily.     EPINEPHrine 0.3 mg/0.3 mL IJ SOAJ injection Inject 0.3 mg into the muscle as needed for anaphylaxis. 1 each 3   ibuprofen (ADVIL) 800 MG tablet Take 1 tablet (800 mg total) by mouth  every 8 (eight) hours as needed. 30 tablet 1   Multiple Vitamins-Minerals (ZINC PO) Take by mouth.     Omega-3 Fatty Acids (FISH OIL PO) Take by mouth.     omeprazole (PRILOSEC) 40 MG capsule Take 40 mg by mouth every morning.     ondansetron (ZOFRAN) 4 MG tablet Take 1 tablet (4 mg total) by mouth every 8 (eight) hours as needed for nausea or vomiting. 20 tablet 0   valACYclovir (VALTREX) 500 MG tablet Take one tablet by mouth daily for prevention of infection.  Take one tablet twice a day for 3 days as needed for an outbreak. (Patient taking differently: Take 500 mg by mouth daily. Take one tablet by mouth daily for prevention of infection.  Take one tablet twice a day for 3 days as needed for an outbreak.) 100 tablet 3   No current facility-administered medications for this visit.     ALLERGIES: Venofer [iron sucrose]  Family History  Problem Relation Age of Onset   Colon polyps Mother        multiple  pre-cancerous polyps on several colonoscopies   Diabetes Father    Hypertension Father    Hyperlipidemia Father    Diabetes Paternal Grandmother    Hyperlipidemia Paternal Grandmother    Hypertension Paternal Grandmother     Social History   Socioeconomic History   Marital status: Married    Spouse name: SCOTT   Number of children: 1   Years of education: 12 + 2   Highest education level: Not on file  Occupational History   Occupation: [employer]  Tobacco Use   Smoking status: Former    Packs/day: 0.25    Types: Cigarettes    Quit date: 11/15/2021    Years since quitting: 0.7   Smokeless tobacco: Never   Tobacco comments:    smokes 5 cigs/week  Vaping Use   Vaping Use: Never used  Substance and Sexual Activity   Alcohol use: Not Currently   Drug use: No   Sexual activity: Yes    Partners: Male    Birth control/protection: Surgical    Comment: Tubal  Other Topics Concern   Not on file  Social History Narrative   Not on file   Social Determinants of Health   Financial Resource Strain: Not on file  Food Insecurity: Not on file  Transportation Needs: Not on file  Physical Activity: Not on file  Stress: Not on file  Social Connections: Not on file  Intimate Partner Violence: Not on file    Review of Systems  PHYSICAL EXAMINATION:    LMP 07/12/2022     General appearance: alert, cooperative and appears stated age Head: Normocephalic, without obvious abnormality, atraumatic Neck: no adenopathy, supple, symmetrical, trachea midline and thyroid normal to inspection and palpation Lungs: clear to auscultation bilaterally Breasts: normal appearance, no masses or tenderness, No nipple retraction or dimpling, No nipple discharge or bleeding, No axillary or supraclavicular adenopathy Heart: regular rate and rhythm Abdomen: soft, non-tender, no masses,  no organomegaly Extremities: extremities normal, atraumatic, no cyanosis or edema Skin: Skin color, texture,  turgor normal. No rashes or lesions Lymph nodes: Cervical, supraclavicular, and axillary nodes normal. No abnormal inguinal nodes palpated Neurologic: Grossly normal  Pelvic: External genitalia:  no lesions              Urethra:  normal appearing urethra with no masses, tenderness or lesions  Bartholins and Skenes: normal                 Vagina: normal appearing vagina with normal color and discharge, no lesions              Cervix: no lesions                Bimanual Exam:  Uterus:  normal size, contour, position, consistency, mobility, non-tender              Adnexa: no mass, fullness, tenderness              Rectal exam: {yes no:[id number]}.  Confirms.              Anus:  normal sphincter tone, no lesions  Chaperone was present for exam:  ***  ASSESSMENT     PLAN     An After Visit Summary was printed and given to the patient.  ______ minutes face to face time of which over 50% was spent in counseling.

## 2022-09-01 ENCOUNTER — Ambulatory Visit: Payer: BC Managed Care – PPO | Admitting: Obstetrics and Gynecology

## 2022-09-02 ENCOUNTER — Other Ambulatory Visit: Payer: BC Managed Care – PPO

## 2022-09-02 ENCOUNTER — Ambulatory Visit: Payer: BC Managed Care – PPO | Admitting: Oncology

## 2022-09-05 ENCOUNTER — Ambulatory Visit: Payer: BC Managed Care – PPO | Admitting: Obstetrics and Gynecology

## 2022-09-05 ENCOUNTER — Encounter: Payer: Self-pay | Admitting: Obstetrics and Gynecology

## 2022-09-05 VITALS — BP 114/80 | HR 77 | Ht 64.0 in | Wt 143.0 lb

## 2022-09-05 DIAGNOSIS — Z90710 Acquired absence of both cervix and uterus: Secondary | ICD-10-CM

## 2022-09-05 NOTE — Progress Notes (Signed)
GYNECOLOGY  VISIT   HPI: 42 y.o.   Married  Caucasian  female   [id number] with Patient's last menstrual period was 07/12/2022.   here for   12 weeks post op  Back at work.  No lifting.  Walking on a treadmill.   Gaining weight about 8 - 10 pounds.   GYNECOLOGIC HISTORY: Patient's last menstrual period was 07/12/2022. Contraception:  hysterectomy Menopausal hormone therapy:  n/a Last mammogram:  n/a Last pap smear:   03/30/22 neg: HR HPV neg, 03/15/21 ASCUS: HR HPV neg        OB History     Gravida  1   Para  1   Term  1   Preterm      AB      Living  1      SAB      IAB      Ectopic      Multiple      Live Births                 Patient Active Problem List   Diagnosis Date Noted   Iron deficiency anemia due to chronic blood loss 05/05/2021   Allergic rhinitis due to allergen 11/05/2019    Past Medical History:  Diagnosis Date   Abnormal uterine bleeding (AUB)    Acid reflux disease    Acute hearing loss    Right   Anemia    COVID    March 2023 h/a and fever and nasal congestion. 06/03/2022   Dizziness    Elevated AST (SGOT) 10/2016   Endometriosis    Left ovarian cyst    Low ferritin    Migraines    without aura   STD (sexually transmitted disease)    HX HSV   Tinnitus, right ear     Past Surgical History:  Procedure Laterality Date   ABLATION ON ENDOMETRIOSIS N/A 07/21/2015   Procedure: FULGERATION OF ENDOMETRIOSIS, BIOPSY OF POSTERIOR CUL DE [place];  Surgeon: Patton Salles, MD;  Location: [facility];  Service: Gynecology;  Laterality: N/A;   APPENDECTOMY  2004   AUGMENTATION MAMMAPLASTY  2011   CESAREAN SECTION  2003   CHOLECYSTECTOMY  2004   CYSTOSCOPY N/A 06/14/2022   Procedure: CYSTOSCOPY;  Surgeon: Patton Salles, MD;  Location: [facility];  Service: Gynecology;  Laterality: N/A;   LAPAROSCOPIC TUBAL LIGATION Right 07/21/2015   Procedure: LAPAROSCOPIC TUBAL LIGATION;  Surgeon: Patton Salles, MD;  Location: [facility];  Service: Gynecology;  Laterality: Right;   OVARIAN CYST REMOVAL Left 07/21/2015   Procedure: EXCISION PARA-OVARIAN CYST;  Surgeon: Patton Salles, MD;  Location: [facility];  Service: Gynecology;  Laterality: Left;   TOTAL LAPAROSCOPIC HYSTERECTOMY WITH SALPINGECTOMY N/A 06/14/2022   Procedure: TOTAL LAPAROSCOPIC HYSTERECTOMY WITH right SALPINGECTOMY; FULGERATION OF ENDOMETRIOSIS;  Surgeon: Patton Salles, MD;  Location: [facility];  Service: Gynecology;  Laterality: N/A;   UNILATERAL SALPINGECTOMY Left 07/21/2015   Procedure: DISTAL UNILATERAL SALPINGECTOMY;  Surgeon: Patton Salles, MD;  Location: [facility];  Service: Gynecology;  Laterality: Left;    Current Outpatient Medications  Medication Sig Dispense Refill   Ascorbic Acid (VITAMIN C PO) Take by mouth.     BIOTIN PO Take by mouth.     ELDERBERRY PO Take 1 tablet by mouth daily.     EPINEPHrine 0.3 mg/0.3 mL IJ SOAJ injection Inject 0.3 mg into  the muscle as needed for anaphylaxis. 1 each 3   Multiple Vitamins-Minerals (ZINC PO) Take by mouth.     omeprazole (PRILOSEC) 40 MG capsule Take 40 mg by mouth every morning.     valACYclovir (VALTREX) 500 MG tablet Take one tablet by mouth daily for prevention of infection.  Take one tablet twice a day for 3 days as needed for an outbreak. (Patient taking differently: Take 500 mg by mouth daily. Take one tablet by mouth daily for prevention of infection.  Take one tablet twice a day for 3 days as needed for an outbreak.) 100 tablet 3   Omega-3 Fatty Acids (FISH OIL PO) Take by mouth.     No current facility-administered medications for this visit.     ALLERGIES: Venofer [iron sucrose]  Family History  Problem Relation Age of Onset   Colon polyps Mother        multiple pre-cancerous polyps on several colonoscopies   Diabetes Father    Hypertension Father    Hyperlipidemia Father    Diabetes Paternal Grandmother     Hyperlipidemia Paternal Grandmother    Hypertension Paternal Grandmother     Social History   Socioeconomic History   Marital status: Married    Spouse name: SCOTT   Number of children: 1   Years of education: 12 + 2   Highest education level: Not on file  Occupational History   Occupation: [employer]  Tobacco Use   Smoking status: Former    Packs/day: 0.25    Types: Cigarettes    Quit date: 11/15/2021    Years since quitting: 0.8   Smokeless tobacco: Never   Tobacco comments:    smokes 5 cigs/week  Vaping Use   Vaping Use: Never used  Substance and Sexual Activity   Alcohol use: Not Currently   Drug use: No   Sexual activity: Yes    Partners: Male    Birth control/protection: Surgical    Comment: Tubal  Other Topics Concern   Not on file  Social History Narrative   Not on file   Social Determinants of Health   Financial Resource Strain: Not on file  Food Insecurity: Not on file  Transportation Needs: Not on file  Physical Activity: Not on file  Stress: Not on file  Social Connections: Not on file  Intimate Partner Violence: Not on file    Review of Systems  PHYSICAL EXAMINATION:    BP 114/80 (BP Location: Left Arm, Patient Position: Sitting, Cuff Size: Normal)   Pulse 77   Ht 5\' 4"  (1.626 m)   Wt 143 lb (64.9 kg)   LMP 07/12/2022   SpO2 98%   BMI 24.55 kg/m     General appearance: alert, cooperative and appears stated age  Abdomen: incisions intact.  Abdomen is soft, non-tender, no masses,  no organomegaly   Pelvic: External genitalia:  no lesions              Urethra:  normal appearing urethra with no masses, tenderness or lesions              Bartholins and Skenes: normal                 Vagina: normal appearing vagina with normal color and discharge, no lesions.  Cuff intact.               Cervix: absent                Bimanual Exam:  Uterus:  absent              Adnexa: no mass, fullness, tenderness             Chaperone was present  for exam:  Raquel Sarna  ASSESSMENT  Status post laparoscopic hysterectomy.  Doing well post op.   PLAN  Ok to resume all normal activities - exercise, normal work activities, sexual activity.  FU for annual exam in August, 2024 and prn.    An After Visit Summary was printed and given to the patient.

## 2022-09-06 ENCOUNTER — Ambulatory Visit: Payer: BC Managed Care – PPO | Admitting: Obstetrics and Gynecology

## 2022-10-11 ENCOUNTER — Encounter: Payer: Self-pay | Admitting: Physician Assistant

## 2022-10-11 ENCOUNTER — Ambulatory Visit: Payer: BC Managed Care – PPO | Admitting: Physician Assistant

## 2022-10-11 VITALS — BP 100/68 | HR 74 | Temp 97.2°F | Ht 64.0 in | Wt 143.4 lb

## 2022-10-11 DIAGNOSIS — J3089 Other allergic rhinitis: Secondary | ICD-10-CM

## 2022-10-11 MED ORDER — TRIAMCINOLONE ACETONIDE 40 MG/ML IJ SUSP
60.0000 mg | Freq: Once | INTRAMUSCULAR | Status: AC
  Administered 2022-10-11: 60 mg via INTRAMUSCULAR

## 2022-10-11 MED ORDER — FLUTICASONE PROPIONATE 50 MCG/ACT NA SUSP: 2.0000 | Freq: Every day | NASAL | 6 refills | Status: DC

## 2022-10-11 NOTE — Progress Notes (Signed)
Acute Office Visit  Subjective:    Patient ID: Ann Whitehead, female    DOB: Dec 13, 1980, 42 y.o.   MRN: [id number]  Chief Complaint  Patient presents with   itchy eyes    sneezing    HPI Patient is in today for complaints of allergies -- every season she comes in and requests kenalog injection which helps her greatly through allergy season.  Her symptoms include runny nose, watery eyes and itchy eyes.  At times she has generalized itching of skin also. She also uses zyrtec in morning and when she travels she takes xyzal at night Denies fever, headache , malaise  Past Medical History:  Diagnosis Date   Abnormal uterine bleeding (AUB)    Acid reflux disease    Acute hearing loss    Right   Anemia    COVID    March 2023 h/a and fever and nasal congestion. 06/03/2022   Dizziness    Elevated AST (SGOT) 10/2016   Endometriosis    Left ovarian cyst    Low ferritin    Migraines    without aura   STD (sexually transmitted disease)    HX HSV   Tinnitus, right ear     Past Surgical History:  Procedure Laterality Date   ABLATION ON ENDOMETRIOSIS N/A 07/21/2015   Procedure: FULGERATION OF ENDOMETRIOSIS, BIOPSY OF POSTERIOR CUL DE [place];  Surgeon: Nunzio Cobbs, MD;  Location: [facility];  Service: Gynecology;  Laterality: N/A;   APPENDECTOMY  2004   AUGMENTATION MAMMAPLASTY  2011   CESAREAN SECTION  2003   CHOLECYSTECTOMY  2004   CYSTOSCOPY N/A 06/14/2022   Procedure: CYSTOSCOPY;  Surgeon: Nunzio Cobbs, MD;  Location: [facility];  Service: Gynecology;  Laterality: N/A;   LAPAROSCOPIC TUBAL LIGATION Right 07/21/2015   Procedure: LAPAROSCOPIC TUBAL LIGATION;  Surgeon: Nunzio Cobbs, MD;  Location: [facility];  Service: Gynecology;  Laterality: Right;   OVARIAN CYST REMOVAL Left 07/21/2015   Procedure: EXCISION PARA-OVARIAN CYST;  Surgeon: Nunzio Cobbs, MD;  Location: [facility];  Service: Gynecology;  Laterality: Left;   TOTAL  LAPAROSCOPIC HYSTERECTOMY WITH SALPINGECTOMY N/A 06/14/2022   Procedure: TOTAL LAPAROSCOPIC HYSTERECTOMY WITH right SALPINGECTOMY; FULGERATION OF ENDOMETRIOSIS;  Surgeon: Nunzio Cobbs, MD;  Location: [facility];  Service: Gynecology;  Laterality: N/A;   UNILATERAL SALPINGECTOMY Left 07/21/2015   Procedure: DISTAL UNILATERAL SALPINGECTOMY;  Surgeon: Nunzio Cobbs, MD;  Location: [facility];  Service: Gynecology;  Laterality: Left;    Family History  Problem Relation Age of Onset   Colon polyps Mother        multiple pre-cancerous polyps on several colonoscopies   Diabetes Father    Hypertension Father    Hyperlipidemia Father    Diabetes Paternal Grandmother    Hyperlipidemia Paternal Grandmother    Hypertension Paternal Grandmother     Social History   Socioeconomic History   Marital status: Married    Spouse name: SCOTT   Number of children: 1   Years of education: 12 + 2   Highest education level: Not on file  Occupational History   Occupation: [employer]  Tobacco Use   Smoking status: Former    Packs/day: 0.25    Types: Cigarettes    Quit date: 11/15/2021    Years since quitting: 0.9   Smokeless tobacco: Never   Tobacco comments:    smokes 5 cigs/week  Vaping Use   Vaping Use: Never used  Substance and Sexual Activity   Alcohol use: Not Currently   Drug use: No   Sexual activity: Yes    Partners: Male    Birth control/protection: Surgical    Comment: Tubal  Other Topics Concern   Not on file  Social History Narrative   Not on file   Social Determinants of Health   Financial Resource Strain: Not on file  Food Insecurity: Not on file  Transportation Needs: Not on file  Physical Activity: Not on file  Stress: Not on file  Social Connections: Not on file  Intimate Partner Violence: Not on file     Current Outpatient Medications:    Ascorbic Acid (VITAMIN C PO), Take by mouth., Disp: , Rfl:    BIOTIN PO, Take by  mouth., Disp: , Rfl:    ELDERBERRY PO, Take 1 tablet by mouth daily., Disp: , Rfl:    EPINEPHrine 0.3 mg/0.3 mL IJ SOAJ injection, Inject 0.3 mg into the muscle as needed for anaphylaxis., Disp: 1 each, Rfl: 3   fluticasone (FLONASE) 50 MCG/ACT nasal spray, Place 2 sprays into both nostrils daily., Disp: 16 g, Rfl: 6   Multiple Vitamins-Minerals (ZINC PO), Take by mouth., Disp: , Rfl:    Omega-3 Fatty Acids (FISH OIL PO), Take by mouth., Disp: , Rfl:    omeprazole (PRILOSEC) 40 MG capsule, Take 40 mg by mouth every morning., Disp: , Rfl:    valACYclovir (VALTREX) 500 MG tablet, Take one tablet by mouth daily for prevention of infection.  Take one tablet twice a day for 3 days as needed for an outbreak. (Patient taking differently: Take 500 mg by mouth daily. Take one tablet by mouth daily for prevention of infection.  Take one tablet twice a day for 3 days as needed for an outbreak.), Disp: 100 tablet, Rfl: 3  Current Facility-Administered Medications:    triamcinolone acetonide (KENALOG-40) injection 60 mg, 60 mg, Intramuscular, Once, Marge Duncans, PA-C   Allergies  Allergen Reactions   Venofer [Iron Sucrose] Hives    Patient was discharged from the clinic after receiving Venofer in stable condition. She returned 30 minutes later concerned that she was having a reaction. She presented very flushed and complained of tingling in her hands and feet. She does not complain of respiratory concerns. She does feel very "jittery". IV was started and she was given Benadryl 50 mg and Pepcid 20 mg. She was feeling some improvement after the benadryl.    CONSTITUTIONAL: Negative for chills, fatigue, fever,  E/N/T: see HPI CARDIOVASCULAR: Negative for chest pain,  RESPIRATORY: Negative for recent cough and dyspnea.  INTEGUMENTARY: Negative for rash.          Objective:    PHYSICAL EXAM:   VS: BP 100/68 (BP Location: Left Arm, Patient Position: Sitting, Cuff Size: Large)   Pulse 74   Temp (!) 97.2  F (36.2 C) (Temporal)   Ht '5\' 4"'$  (1.626 m)   Wt 143 lb 6.4 oz (65 kg)   LMP 07/12/2022   SpO2 98%   BMI 24.61 kg/m   GEN: Well nourished, well developed, in no acute distress  HEENT: normal external ears and nose - normal external auditory canals and TMS -  - Lips, Teeth and Gums - normal  Oropharynx - minimal erythema Cardiac: RRR; no murmurs,  Respiratory:  normal respiratory rate and pattern with no distress - normal breath sounds with no rales, rhonchi, wheezes or rubs   Wt  Readings from Last 3 Encounters:  10/11/22 143 lb 6.4 oz (65 kg)  09/05/22 143 lb (64.9 kg)  07/26/22 134 lb 14.4 oz (61.2 kg)    There are no preventive care reminders to display for this patient.  There are no preventive care reminders to display for this patient.        Assessment & Plan:   Problem List Items Addressed This Visit       Respiratory   Allergic rhinitis due to allergen - Primary   Relevant Medications   triamcinolone acetonide (KENALOG-40) injection 60 mg (Start on 10/11/2022  8:45 AM)   fluticasone (FLONASE) 50 MCG/ACT nasal spray     Meds ordered this encounter  Medications   triamcinolone acetonide (KENALOG-40) injection 60 mg   fluticasone (FLONASE) 50 MCG/ACT nasal spray    Sig: Place 2 sprays into both nostrils daily.    Dispense:  16 g    Refill:  6    Order Specific Question:   Supervising Provider    Answer:   COX, KIRSTEN [id number]     [place], PA-C

## 2022-11-03 ENCOUNTER — Encounter: Payer: Self-pay | Admitting: Physician Assistant

## 2022-11-03 ENCOUNTER — Telehealth: Payer: Self-pay

## 2022-11-03 ENCOUNTER — Telehealth (INDEPENDENT_AMBULATORY_CARE_PROVIDER_SITE_OTHER): Payer: BC Managed Care – PPO | Admitting: Physician Assistant

## 2022-11-03 ENCOUNTER — Other Ambulatory Visit: Payer: Self-pay | Admitting: Physician Assistant

## 2022-11-03 VITALS — Ht 64.0 in | Wt 143.0 lb

## 2022-11-03 DIAGNOSIS — B029 Zoster without complications: Secondary | ICD-10-CM | POA: Diagnosis not present

## 2022-11-03 MED ORDER — GABAPENTIN 100 MG PO CAPS: 100.0000 mg | ORAL_CAPSULE | Freq: Three times a day (TID) | ORAL | 1 refills | Status: DC

## 2022-11-03 MED ORDER — PREDNISONE 20 MG PO TABS: ORAL_TABLET | ORAL | 0 refills | Status: AC

## 2022-11-03 MED ORDER — VALACYCLOVIR HCL 1 G PO TABS: 1000.0000 mg | ORAL_TABLET | Freq: Two times a day (BID) | ORAL | 0 refills | Status: AC

## 2022-11-03 NOTE — Progress Notes (Signed)
Virtual Visit via Telephone Note   This visit type was conducted due to national recommendations for restrictions regarding the COVID-19 Pandemic (e.g. social distancing) in an effort to limit this patient's exposure and mitigate transmission in our community.  Due to her co-morbid illnesses, this patient is at least at moderate risk for complications without adequate follow up.  This format is felt to be most appropriate for this patient at this time.  The patient did not have access to video technology/had technical difficulties with video requiring transitioning to audio format only (telephone).  All issues noted in this document were discussed and addressed.  No physical exam could be performed with this format.  Patient verbally consented to a telehealth visit.   Date:  11/03/2022   ID:  Ann Whitehead, DOB 1981-08-10, MRN [id number]  Patient Location: Home Provider Location: Office  PCP:  Marge Duncans, PA-C     Chief Complaint:  rash on face  History of Present Illness:    Ann Whitehead is a 42 y.o. female with complaints of blistered rash on face - noted on Tuesday and started with bllisters on lip and has extended up the bridge of her nose and onto right eyelid - states does burn  The patient does not have symptoms concerning for COVID-19 infection (fever, chills, cough, or new shortness of breath).    Past Medical History:  Diagnosis Date   Abnormal uterine bleeding (AUB)    Acid reflux disease    Acute hearing loss    Right   Anemia    COVID    March 2023 h/a and fever and nasal congestion. 06/03/2022   Dizziness    Elevated AST (SGOT) 10/2016   Endometriosis    Left ovarian cyst    Low ferritin    Migraines    without aura   STD (sexually transmitted disease)    HX HSV   Tinnitus, right ear    Past Surgical History:  Procedure Laterality Date   ABLATION ON ENDOMETRIOSIS N/A 07/21/2015   Procedure: FULGERATION OF ENDOMETRIOSIS, BIOPSY OF POSTERIOR CUL DE  [place];  Surgeon: Nunzio Cobbs, MD;  Location: [facility];  Service: Gynecology;  Laterality: N/A;   APPENDECTOMY  2004   AUGMENTATION MAMMAPLASTY  2011   CESAREAN SECTION  2003   CHOLECYSTECTOMY  2004   CYSTOSCOPY N/A 06/14/2022   Procedure: CYSTOSCOPY;  Surgeon: Nunzio Cobbs, MD;  Location: [facility];  Service: Gynecology;  Laterality: N/A;   LAPAROSCOPIC TUBAL LIGATION Right 07/21/2015   Procedure: LAPAROSCOPIC TUBAL LIGATION;  Surgeon: Nunzio Cobbs, MD;  Location: [facility];  Service: Gynecology;  Laterality: Right;   OVARIAN CYST REMOVAL Left 07/21/2015   Procedure: EXCISION PARA-OVARIAN CYST;  Surgeon: Nunzio Cobbs, MD;  Location: [facility];  Service: Gynecology;  Laterality: Left;   TOTAL LAPAROSCOPIC HYSTERECTOMY WITH SALPINGECTOMY N/A 06/14/2022   Procedure: TOTAL LAPAROSCOPIC HYSTERECTOMY WITH right SALPINGECTOMY; FULGERATION OF ENDOMETRIOSIS;  Surgeon: Nunzio Cobbs, MD;  Location[facility];  Service: Gynecology;  Laterality: N/A;   UNILATERAL SALPINGECTOMY Left 07/21/2015   Procedure: DISTAL UNILATERAL SALPINGECTOMY;  Surgeon: Nunzio Cobbs, MD;  Location: [facility];  Service: Gynecology;  Laterality: Left;     Current Meds  Medication Sig   Ascorbic Acid (VITAMIN C PO) Take by mouth.   BIOTIN PO Take by mouth.   ELDERBERRY PO Take 1 tablet by mouth daily.  EPINEPHrine 0.3 mg/0.3 mL IJ SOAJ injection Inject 0.3 mg into the muscle as needed for anaphylaxis.   fluticasone (FLONASE) 50 MCG/ACT nasal spray Place 2 sprays into both nostrils daily.   Multiple Vitamins-Minerals (ZINC PO) Take by mouth.   Omega-3 Fatty Acids (FISH OIL PO) Take by mouth.   omeprazole (PRILOSEC) 40 MG capsule Take 40 mg by mouth every morning.   predniSONE (DELTASONE) 20 MG tablet Take 3 tablets (60 mg total) by mouth daily with breakfast for 3 days, THEN 2 tablets (40 mg total) daily with breakfast for 3 days, THEN  1 tablet (20 mg total) daily with breakfast for 3 days.   valACYclovir (VALTREX) 1000 MG tablet Take 1 tablet (1,000 mg total) by mouth 2 (two) times daily for 10 days.   [DISCONTINUED] valACYclovir (VALTREX) 500 MG tablet Take one tablet by mouth daily for prevention of infection.  Take one tablet twice a day for 3 days as needed for an outbreak. (Patient taking differently: Take 500 mg by mouth daily. Take one tablet by mouth daily for prevention of infection.  Take one tablet twice a day for 3 days as needed for an outbreak.)     Allergies:   Venofer [iron sucrose]   Social History   Tobacco Use   Smoking status: Former    Packs/day: .25    Types: Cigarettes    Quit date: 11/15/2021    Years since quitting: 0.9   Smokeless tobacco: Never   Tobacco comments:    smokes 5 cigs/week  Vaping Use   Vaping Use: Never used  Substance Use Topics   Alcohol use: Not Currently   Drug use: No     Family Hx: The patient's family history includes Colon polyps in her mother; Diabetes in her father and paternal grandmother; Hyperlipidemia in her father and paternal grandmother; Hypertension in her father and paternal grandmother.  ROS:   Please see the history of present illness.    All other systems reviewed and are negative.  Labs/Other Tests and Data Reviewed:    Recent Labs: 03/30/2022: ALT 18; TSH 1.84 06/08/2022: BUN 15; Creatinine, Ser 0.70; Potassium 3.6; Sodium 138 07/26/2022: Hemoglobin 13.0; Platelets 295   Recent Lipid Panel Lab Results  Component Value Date/Time   CHOL 185 03/30/2022 08:42 AM   CHOL 167 03/11/2020 10:28 AM   TRIG 38 03/30/2022 08:42 AM   HDL 71 03/30/2022 08:42 AM   HDL 57 03/11/2020 10:28 AM   CHOLHDL 2.6 03/30/2022 08:42 AM   LDLCALC 103 (H) 03/30/2022 08:42 AM    Wt Readings from Last 3 Encounters:  11/03/22 143 lb (64.9 kg)  10/11/22 143 lb 6.4 oz (65 kg)  09/05/22 143 lb (64.9 kg)     Objective:    Vital Signs:  Ht 5\' 4"  (1.626 m)   Wt 143  lb (64.9 kg)   LMP 07/12/2022   BMI 24.55 kg/m    VITAL SIGNS:  reviewed GEN:  alert, NAD SKIN:  has blisters noted on chin up across lips extending the bridge of the nose and right eyelid  ASSESSMENT & PLAN:    Herpes zoster Rx for prednisone taper as directed and valtrex 1000mg  bid Recommend follow up if symptoms change or worsen and also to eye doctor if begins having vision symptoms or involvement of eye itself Recommend to avoid direct patient care until healed  COVID-19 Education: The signs and symptoms of COVID-19 were discussed with the patient and how to seek care for testing (follow  up with PCP or arrange E-visit). The importance of social distancing was discussed today.  Time:   Today, I have spent 10 minutes with the patient with telehealth technology discussing the above problems.     Medication Adjustments/Labs and Tests Ordered: Current medicines are reviewed at length with the patient today.  Concerns regarding medicines are outlined above.   Tests Ordered: No orders of the defined types were placed in this encounter.   Medication Changes: Meds ordered this encounter  Medications   predniSONE (DELTASONE) 20 MG tablet    Sig: Take 3 tablets (60 mg total) by mouth daily with breakfast for 3 days, THEN 2 tablets (40 mg total) daily with breakfast for 3 days, THEN 1 tablet (20 mg total) daily with breakfast for 3 days.    Dispense:  18 tablet    Refill:  0    Order Specific Question:   Supervising Provider    Answer:   Shelton Silvas   valACYclovir (VALTREX) 1000 MG tablet    Sig: Take 1 tablet (1,000 mg total) by mouth 2 (two) times daily for 10 days.    Dispense:  20 tablet    Refill:  0    Order Specific Question:   Supervising Provider    AnswerShelton Silvas    Follow Up:  In Person prn  Signed, Webb Silversmith, PA-C  11/03/2022 11:55 AM    [place]

## 2022-11-03 NOTE — Telephone Encounter (Signed)
Patient called and left voicemail requesting if you could go ahead and call something in to the pharmacy for nerve pain. She is stating that she didn't want to go into the weekend and not be able to having something for the nerve pain. Please advise.

## 2022-11-03 NOTE — Telephone Encounter (Signed)
Patient informed. 

## 2022-11-07 DIAGNOSIS — B029 Zoster without complications: Secondary | ICD-10-CM | POA: Diagnosis not present

## 2022-12-30 ENCOUNTER — Encounter: Payer: Self-pay | Admitting: Physician Assistant

## 2022-12-30 ENCOUNTER — Telehealth: Payer: BC Managed Care – PPO

## 2022-12-30 NOTE — Telephone Encounter (Signed)
Your provider is currently not here today and wont be back till next Wednesday. Per our policy you would have to be seen by a provider to be treated. Our suggestion would be to go to the nearest urgent care or try to do a my chart visit with our online providers.

## 2022-12-31 ENCOUNTER — Telehealth: Payer: BC Managed Care – PPO | Admitting: Nurse Practitioner

## 2022-12-31 DIAGNOSIS — J0101 Acute recurrent maxillary sinusitis: Secondary | ICD-10-CM | POA: Diagnosis not present

## 2022-12-31 MED ORDER — AMOXICILLIN-POT CLAVULANATE 875-125 MG PO TABS: 1.0000 | ORAL_TABLET | Freq: Two times a day (BID) | ORAL | 0 refills | Status: DC

## 2022-12-31 NOTE — Patient Instructions (Signed)
Ann Whitehead, thank you for joining Bennie Pierini, FNP for today's virtual visit.  While this provider is not your primary care provider (PCP), if your PCP is located in our provider database this encounter information will be shared with them immediately following your visit.   A [facility] MyChart account gives you access to today's visit and all your visits, tests, and labs performed at [facility] " click here if you don't have a [facility] MyChart account or go to [email or web address]  Consent: (Patient) Ann Whitehead provided verbal consent for this virtual visit at the beginning of the encounter.  Current Medications:  Current Outpatient Medications:    amoxicillin-clavulanate (AUGMENTIN) 875-125 MG tablet, Take 1 tablet by mouth 2 (two) times daily., Disp: 14 tablet, Rfl: 0   Ascorbic Acid (VITAMIN C PO), Take by mouth., Disp: , Rfl:    BIOTIN PO, Take by mouth., Disp: , Rfl:    ELDERBERRY PO, Take 1 tablet by mouth daily., Disp: , Rfl:    EPINEPHrine 0.3 mg/0.3 mL IJ SOAJ injection, Inject 0.3 mg into the muscle as needed for anaphylaxis., Disp: 1 each, Rfl: 3   fluticasone (FLONASE) 50 MCG/ACT nasal spray, Place 2 sprays into both nostrils daily., Disp: 16 g, Rfl: 6   gabapentin (NEURONTIN) 100 MG capsule, Take 1 capsule (100 mg total) by mouth 3 (three) times daily., Disp: 30 capsule, Rfl: 1   Multiple Vitamins-Minerals (ZINC PO), Take by mouth., Disp: , Rfl:    Omega-3 Fatty Acids (FISH OIL PO), Take by mouth., Disp: , Rfl:    omeprazole (PRILOSEC) 40 MG capsule, Take 40 mg by mouth every morning., Disp: , Rfl:    Medications ordered in this encounter:  Meds ordered this encounter  Medications   amoxicillin-clavulanate (AUGMENTIN) 875-125 MG tablet    Sig: Take 1 tablet by mouth 2 (two) times daily.    Dispense:  14 tablet    Refill:  0    Order Specific Question:   Supervising Provider    Answer:   Merrilee Jansky [id number]     *If you  need refills on other medications prior to your next appointment, please contact your pharmacy*  Follow-Up: Call back or seek an in-person evaluation if the symptoms worsen or if the condition fails to improve as anticipated.  [facility] [phone]  Other Instructions 1. Take meds as prescribed 2. Use a cool mist humidifier especially during the winter months and when heat has been humid. 3. Use saline nose sprays frequently 4. Saline irrigations of the nose can be very helpful if done frequently.  * 4X daily for 1 week*  * Use of a nettie pot can be helpful with this. Follow directions with this* 5. Drink plenty of fluids 6. Keep thermostat turn down low 7.For any cough or congestion- mucinex d 8. For fever or aces or pains- take tylenol or ibuprofen appropriate for age and weight.  * for fevers greater than 101 orally you may alternate ibuprofen and tylenol every  3 hours.      If you have been instructed to have an in-person evaluation today at a local Urgent Care facility, please use the link below. It will take you to a list of all of our available [facility], including address, phone number and hours of operation. Please do not delay care.  [facility]  If you or a family member do not have a primary care provider, use the link below  to schedule a visit and establish care. When you choose a [facility] primary care physician or advanced practice provider, you gain a long-term partner in health. Find a Primary Care Provider  Learn more about [facility]'s in-office and virtual care options:  - Get Care Now

## 2022-12-31 NOTE — Progress Notes (Signed)
Virtual Visit Consent   Ann Whitehead, you are scheduled for a virtual visit with Ann Daphine Deutscher, FNP, a [facility] provider, today.     Just as with appointments in the office, your consent must be obtained to participate.  Your consent will be active for this visit and any virtual visit you may have with one of our providers in the next 365 days.     If you have a MyChart account, a copy of this consent can be sent to you electronically.  All virtual visits are billed to your insurance company just like a traditional visit in the office.    As this is a virtual visit, video technology does not allow for your provider to perform a traditional examination.  This may limit your provider's ability to fully assess your condition.  If your provider identifies any concerns that need to be evaluated in person or the need to arrange testing (such as labs, EKG, etc.), we will make arrangements to do so.     Although advances in technology are sophisticated, we cannot ensure that it will always work on either your end or our end.  If the connection with a video visit is poor, the visit may have to be switched to a telephone visit.  With either a video or telephone visit, we are not always able to ensure that we have a secure connection.     I need to obtain your verbal consent now.   Are you willing to proceed with your visit today? YES   Ann Whitehead has provided verbal consent on 12/31/2022 for a virtual visit (video or telephone).   Ann Daphine Deutscher, FNP   Date: 12/31/2022 8:22 AM   Virtual Visit via Video Note   I, Ann Whitehead, connected with Ann Whitehead ([id number], [birth year]) on 12/31/22 at  8:30 AM EDT by a video-enabled telemedicine application and verified that I am speaking with the correct person using two identifiers.  Location: Patient: Virtual Visit Location Patient: Home Provider: Virtual Visit Location Provider: Mobile   I discussed the limitations of  evaluation and management by telemedicine and the availability of in person appointments. The patient expressed understanding and agreed to proceed.    History of Present Illness: Ann Whitehead is a 42 y.o. who identifies as a female who was assigned female at birth, and is being seen today for sinusitis .  HPI: Patient has frequent sinusitis that she has  to get kenalog shots for. Last kenalog shot was in March.   Sinusitis This is a new problem. The current episode started yesterday. The problem has been waxing and waning since onset. Associated symptoms include congestion, coughing, sinus pressure and a sore throat. (Unable to sleep) Treatments tried: sudafed and  ibuprofen. The treatment provided no relief.    Review of Systems  HENT:  Positive for congestion, sinus pressure and sore throat.   Respiratory:  Positive for cough.     Problems:  Patient Active Problem List   Diagnosis Date Noted   Iron deficiency anemia due to chronic blood loss 05/05/2021   Allergic rhinitis due to allergen 11/05/2019    Allergies:  Allergies  Allergen Reactions   Venofer [Iron Sucrose] Hives    Patient was discharged from the clinic after receiving Venofer in stable condition. She returned 30 minutes later concerned that she was having a reaction. She presented very flushed and complained of tingling in her hands and feet. She does not complain of respiratory concerns. She does feel  very "jittery". IV was started and she was given Benadryl 50 mg and Pepcid 20 mg. She was feeling some improvement after the benadryl.   Medications:  Current Outpatient Medications:    Ascorbic Acid (VITAMIN C PO), Take by mouth., Disp: , Rfl:    BIOTIN PO, Take by mouth., Disp: , Rfl:    ELDERBERRY PO, Take 1 tablet by mouth daily., Disp: , Rfl:    EPINEPHrine 0.3 mg/0.3 mL IJ SOAJ injection, Inject 0.3 mg into the muscle as needed for anaphylaxis., Disp: 1 each, Rfl: 3   fluticasone (FLONASE) 50 MCG/ACT nasal  spray, Place 2 sprays into both nostrils daily., Disp: 16 g, Rfl: 6   gabapentin (NEURONTIN) 100 MG capsule, Take 1 capsule (100 mg total) by mouth 3 (three) times daily., Disp: 30 capsule, Rfl: 1   Multiple Vitamins-Minerals (ZINC PO), Take by mouth., Disp: , Rfl:    Omega-3 Fatty Acids (FISH OIL PO), Take by mouth., Disp: , Rfl:    omeprazole (PRILOSEC) 40 MG capsule, Take 40 mg by mouth every morning., Disp: , Rfl:   Observations/Objective: Patient is well-developed, well-nourished in no acute distress.  Resting comfortably  at home.  Head is normocephalic, atraumatic.  No labored breathing.  Speech is clear and coherent with logical content.  Patient is alert and oriented at baseline.  Hoarse Maxillary sinus pressure bil.  Assessment and Plan:  Ann Whitehead in today with chief complaint of Sinusitis   1. Acute recurrent maxillary sinusitis 1. Take meds as prescribed 2. Use a cool mist humidifier especially during the winter months and when heat has been humid. 3. Use saline nose sprays frequently 4. Saline irrigations of the nose can be very helpful if done frequently.  * 4X daily for 1 week*  * Use of a nettie pot can be helpful with this. Follow directions with this* 5. Drink plenty of fluids 6. Keep thermostat turn down low 7.For any cough or congestion- mucinex d 8. For fever or aces or pains- take tylenol or ibuprofen appropriate for age and weight.  * for fevers greater than 101 orally you may alternate ibuprofen and tylenol every  3 hours.    Meds ordered this encounter  Medications   amoxicillin-clavulanate (AUGMENTIN) 875-125 MG tablet    Sig: Take 1 tablet by mouth 2 (two) times daily.    Dispense:  14 tablet    Refill:  0    Order Specific Question:   Supervising Provider    Answer:   Merrilee Jansky [id number]      Follow Up Instructions: I discussed the assessment and treatment plan with the patient. The patient was provided an opportunity to ask  questions and all were answered. The patient agreed with the plan and demonstrated an understanding of the instructions.  A copy of instructions were sent to the patient via MyChart.  The patient was advised to call back or seek an in-person evaluation if the symptoms worsen or if the condition fails to improve as anticipated.  Time:  I spent 7 minutes with the patient via telehealth technology discussing the above problems/concerns.    Ann Daphine Deutscher, FNP

## 2023-01-10 ENCOUNTER — Encounter: Payer: Self-pay | Admitting: Physician Assistant

## 2023-01-10 ENCOUNTER — Ambulatory Visit: Payer: BC Managed Care – PPO | Admitting: Physician Assistant

## 2023-01-10 VITALS — BP 102/70 | HR 87 | Temp 97.1°F | Ht 64.0 in | Wt 138.0 lb

## 2023-01-10 DIAGNOSIS — J060 Acute laryngopharyngitis: Secondary | ICD-10-CM | POA: Diagnosis not present

## 2023-01-10 MED ORDER — AZITHROMYCIN 250 MG PO TABS: ORAL_TABLET | ORAL | 0 refills | Status: AC

## 2023-01-10 NOTE — Progress Notes (Signed)
   Acute Office Visit  Subjective:    Patient ID: Ann Whitehead, female    DOB: Mar 15, 1981, 42 y.o.   MRN: [id number]  Chief Complaint  Patient presents with   Nasal Congestion   Cough    HPI: Patient is in today for complaints of cough, cold , congestion and sore throat.  She was diagnosed with COVID about 11 days ago and these symptoms have been persistent - she denies fever today Has had mild fatigue, and upper teeth hurting   Current Outpatient Medications:    Ascorbic Acid (VITAMIN C PO), Take by mouth., Disp: , Rfl:    azithromycin (ZITHROMAX) 250 MG tablet, Take 2 tablets on day 1, then 1 tablet daily on days 2 through 5, Disp: 6 tablet, Rfl: 0   BIOTIN PO, Take by mouth., Disp: , Rfl:    ELDERBERRY PO, Take 1 tablet by mouth daily., Disp: , Rfl:    EPINEPHrine 0.3 mg/0.3 mL IJ SOAJ injection, Inject 0.3 mg into the muscle as needed for anaphylaxis., Disp: 1 each, Rfl: 3   Multiple Vitamins-Minerals (ZINC PO), Take by mouth., Disp: , Rfl:    Omega-3 Fatty Acids (FISH OIL PO), Take by mouth., Disp: , Rfl:    omeprazole (PRILOSEC) 40 MG capsule, Take 40 mg by mouth every morning., Disp: , Rfl:    valACYclovir (VALTREX) 500 MG tablet, PLEASE SEE ATTACHED FOR DETAILED DIRECTIONS, Disp: , Rfl:   Allergies  Allergen Reactions   Venofer [Iron Sucrose] Hives    Patient was discharged from the clinic after receiving Venofer in stable condition. She returned 30 minutes later concerned that she was having a reaction. She presented very flushed and complained of tingling in her hands and feet. She does not complain of respiratory concerns. She does feel very "jittery". IV was started and she was given Benadryl 50 mg and Pepcid 20 mg. She was feeling some improvement after the benadryl.    ROS CONSTITUTIONAL: see HPI E/N/T: see HPI CARDIOVASCULAR: Negative for chest pain, dizziness,  RESPIRATORY: see HPI     Objective:    PHYSICAL EXAM:   BP 102/70 (BP Location: Left Arm,  Patient Position: Sitting, Cuff Size: Large)   Pulse 87   Temp (!) 97.1 F (36.2 C) (Temporal)   Ht 5\' 4"  (1.626 m)   Wt 138 lb (62.6 kg)   LMP 07/12/2022   SpO2 99%   BMI 23.69 kg/m    GEN: Well nourished, well developed, in no acute distress  HEENT: normal external ears and nose - normal external auditory canals and TMS - - Lips, Teeth and Gums - normal  Oropharynx - erythema/pnd Cardiac: RRR; no murmurs, Respiratory:  normal respiratory rate and pattern with no distress - normal breath sounds with no rales, rhonchi, wheezes or rubs      Assessment & Plan:    Acute laryngopharyngitis -     Azithromycin; Take 2 tablets on day 1, then 1 tablet daily on days 2 through 5  Dispense: 6 tablet; Refill: 0 Continue decongestants prn    Follow-up: Return if symptoms worsen or fail to improve.  An After Visit Summary was printed and given to the patient.  Jettie Pagan [facility] [phone]

## 2023-04-18 ENCOUNTER — Other Ambulatory Visit: Payer: Self-pay

## 2023-04-18 DIAGNOSIS — B009 Herpesviral infection, unspecified: Secondary | ICD-10-CM

## 2023-04-18 MED ORDER — VALACYCLOVIR HCL 500 MG PO TABS: ORAL_TABLET | ORAL | 0 refills | Status: DC

## 2023-04-18 NOTE — Telephone Encounter (Signed)
Med refill request: Valtrex (prevention and prn) Last AEX: 03/30/2022 Next AEX: 06/19/2023 Last MMG (if hormonal med): n/a Refill authorized: rx pend

## 2023-06-05 NOTE — Progress Notes (Signed)
42 y.o. G72P1001 Married Caucasian female here for annual exam.    Having night sweats and fatigue.  Wants hormone check and routine labs.   FH DM.  Wants refill of Valtrex and vaginal estradiol cream.   Doing teaching/training of others at work.   PCP: Marianne Sofia, PA-C   Patient's last menstrual period was 07/12/2022.           Sexually active: Yes.    The current method of family planning is status post hysterectomy.    Exercising: Yes.     Weight lifting, cardio Smoker:  former  OB History  Gravida Para Term Preterm AB Living  1 1 1     1   SAB IAB Ectopic Multiple Live Births               # Outcome Date GA Lbr Len/2nd Weight Sex Type Anes PTL Lv  1 Term              Health Maintenance: Pap:  03/30/22 neg: HR HPV neg, 03/15/21 ASCUS: HR HPV neg History of abnormal Pap:  yes, 03-06-19 ASCUS:Neg HR HPV--colpo showed Neg ECC,  01/26/18 ASCUS:Pos HR HPV--03-09-18 colpo biopsies were normal  Final cervical pathology for hysterectomy: focal cervicitis, negative for dysplasia.  MMG: n/a Colonoscopy:   HM Colonoscopy     This patient has no relevant Health Maintenance data.      BMD:  n/a  Result  n/a  HIV: 03/09/18 NR Hep C: 03/09/18 neg  Immunization History  Administered Date(s) Administered   Influenza,inj,Quad PF,6+ Mos 06/21/2022   Influenza-Unspecified 06/06/2019, 06/06/2020, 06/11/2021   PFIZER(Purple Top)SARS-COV-2 Vaccination 04/04/2020, 04/25/2020   Tdap 10/10/2014   Flu vaccine done before Halloween.  Done 06/10/23 per patient.    reports that she quit smoking about 19 months ago. Her smoking use included cigarettes. She has never used smokeless tobacco. She reports that she does not currently use alcohol. She reports that she does not use drugs.  Past Medical History:  Diagnosis Date   Abnormal uterine bleeding (AUB)    Acid reflux disease    Acute hearing loss    Right   Anemia    COVID    March 2023 h/a and fever and nasal congestion. 06/03/2022    Dizziness    Elevated AST (SGOT) 10/2016   Endometriosis    Left ovarian cyst    Low ferritin    Migraines    without aura   STD (sexually transmitted disease)    HX HSV   Tinnitus, right ear     Past Surgical History:  Procedure Laterality Date   ABLATION ON ENDOMETRIOSIS N/A 07/21/2015   Procedure: FULGERATION OF ENDOMETRIOSIS, BIOPSY OF POSTERIOR CUL DE SAC;  Surgeon: Patton Salles, MD;  Location: [facility];  Service: Gynecology;  Laterality: N/A;   APPENDECTOMY  2004   AUGMENTATION MAMMAPLASTY  2011   CESAREAN SECTION  2003   CHOLECYSTECTOMY  2004   CYSTOSCOPY N/A 06/14/2022   Procedure: CYSTOSCOPY;  Surgeon: Patton Salles, MD;  [facility];  Service: Gynecology;  Laterality: N/A;   LAPAROSCOPIC TUBAL LIGATION Right 07/21/2015   Procedure: LAPAROSCOPIC TUBAL LIGATION;  Surgeon: Patton Salles, MD;  Location: [facility];  Service: Gynecology;  Laterality: Right;   OVARIAN CYST REMOVAL Left 07/21/2015   Procedure: EXCISION PARA-OVARIAN CYST;  Surgeon: Patton Salles, MD;  Location: [facility];  Service: Gynecology;  Laterality: Left;  TOTAL LAPAROSCOPIC HYSTERECTOMY WITH SALPINGECTOMY N/A 06/14/2022   Procedure: TOTAL LAPAROSCOPIC HYSTERECTOMY WITH right SALPINGECTOMY; FULGERATION OF ENDOMETRIOSIS;  Surgeon: Patton Salles, MD;  Location: [facility];  Service: Gynecology;  Laterality: N/A;   UNILATERAL SALPINGECTOMY Left 07/21/2015   Procedure: DISTAL UNILATERAL SALPINGECTOMY;  Surgeon: Patton Salles, MD;  Location: [facility];  Service: Gynecology;  Laterality: Left;    Current Outpatient Medications  Medication Sig Dispense Refill   Ascorbic Acid (VITAMIN C PO) Take by mouth.     BIOTIN PO Take by mouth.     ELDERBERRY PO Take 1 tablet by mouth daily.     EPINEPHrine 0.3 mg/0.3 mL IJ SOAJ injection Inject 0.3 mg into the muscle as needed for anaphylaxis. 1 each 3   Multiple  Vitamins-Minerals (ZINC PO) Take by mouth.     Omega-3 Fatty Acids (FISH OIL PO) Take by mouth.     omeprazole (PRILOSEC) 40 MG capsule Take 40 mg by mouth every morning.     Turmeric 500 MG CAPS Take by mouth.     valACYclovir (VALTREX) 500 MG tablet Take one tablet (500 mg) by mouth daily for infection prevention. Take one tablet (500 mg) by mouth twice a day for 3 days for an outbreak. 100 tablet 0   No current facility-administered medications for this visit.    Family History  Problem Relation Age of Onset   Colon polyps Mother        multiple pre-cancerous polyps on several colonoscopies   Diabetes Father    Hypertension Father    Hyperlipidemia Father    Diabetes Paternal Grandmother    Hyperlipidemia Paternal Grandmother    Hypertension Paternal Grandmother     Review of Systems  All other systems reviewed and are negative.   Exam:   BP 122/82 (BP Location: Left Arm, Patient Position: Sitting, Cuff Size: Normal)   Pulse 71   Ht 5\' 5"  (1.651 m)   Wt 142 lb (64.4 kg)   LMP 07/12/2022   SpO2 97%   BMI 23.63 kg/m     General appearance: alert, cooperative and appears stated age Head: normocephalic, without obvious abnormality, atraumatic Neck: no adenopathy, supple, symmetrical, trachea midline and thyroid normal to inspection and palpation Lungs: clear to auscultation bilaterally Breasts: consistent with augmentation, no masses or tenderness, No nipple retraction or dimpling, No nipple discharge or bleeding, No axillary adenopathy Heart: regular rate and rhythm Abdomen: soft, non-tender; no masses, no organomegaly Extremities: extremities normal, atraumatic, no cyanosis or edema Skin: skin color, texture, turgor normal. No rashes or lesions Lymph nodes: cervical, supraclavicular, and axillary nodes normal. Neurologic: grossly normal  Pelvic: External genitalia:  no lesions              No abnormal inguinal nodes palpated.              Urethra:  normal appearing  urethra with no masses, tenderness or lesions              Bartholins and Skenes: normal                 Vagina: normal appearing vagina with normal color and discharge, no lesions              Cervix: absent              Pap taken: no Bimanual Exam:  Uterus:  absent  Adnexa: no mass, fullness, tenderness    Chaperone was present for exam:  Warren Lacy, CMA   Assessment:  Well woman with gynecologic exam.  Status post total laparoscopic hysterectomy with right salpingectomy, fulguration of endometriosis, cystoscopy  Status post left salpingectomy.  Hx ASCUS paps and positive HR HPV.  Follow ups neg HR HPV x 3 years.  Final cervical pathology report - benign.  Hx perineal fissures.  None today. Status post breast augmentation.  Migraines without aura. HSV.  Night sweats.  Fatigue.   Plan: Mammogram screening discussed.  She will schedule.  Self breast awareness reviewed. Pap and HR HPV not indicated.  Guidelines for Calcium, Vitamin D, regular exercise program including cardiovascular and weight bearing exercise. Refill Valtrex.  Refill vaginal estradiol cream.  I discussed potential effect on breast cancer.   Chol, CMP, CBC, TSH, A1C, FSH estradiol.  Return in about 1 year (around 06/18/2024) for annual - Dr. Edward Jolly.   After visit summary provided.

## 2023-06-08 ENCOUNTER — Other Ambulatory Visit: Payer: Self-pay | Admitting: Obstetrics and Gynecology

## 2023-06-08 DIAGNOSIS — B009 Herpesviral infection, unspecified: Secondary | ICD-10-CM

## 2023-06-08 NOTE — Telephone Encounter (Signed)
Medication refill request: Valtrex  Last OV:  09/05/22  Next AEX: 06/19/23  Last MMG (if hormonal medication request): n/a Refill authorized: 100 tab with 0 rf pended for today

## 2023-06-19 ENCOUNTER — Encounter: Payer: Self-pay | Admitting: Obstetrics and Gynecology

## 2023-06-19 ENCOUNTER — Ambulatory Visit (INDEPENDENT_AMBULATORY_CARE_PROVIDER_SITE_OTHER): Payer: BC Managed Care – PPO | Admitting: Obstetrics and Gynecology

## 2023-06-19 VITALS — BP 122/82 | HR 71 | Ht 65.0 in | Wt 142.0 lb

## 2023-06-19 DIAGNOSIS — B009 Herpesviral infection, unspecified: Secondary | ICD-10-CM | POA: Diagnosis not present

## 2023-06-19 DIAGNOSIS — E785 Hyperlipidemia, unspecified: Secondary | ICD-10-CM | POA: Diagnosis not present

## 2023-06-19 DIAGNOSIS — R61 Generalized hyperhidrosis: Secondary | ICD-10-CM | POA: Diagnosis not present

## 2023-06-19 DIAGNOSIS — Z0189 Encounter for other specified special examinations: Secondary | ICD-10-CM

## 2023-06-19 DIAGNOSIS — Z01419 Encounter for gynecological examination (general) (routine) without abnormal findings: Secondary | ICD-10-CM | POA: Diagnosis not present

## 2023-06-19 DIAGNOSIS — R5383 Other fatigue: Secondary | ICD-10-CM

## 2023-06-19 DIAGNOSIS — Z79890 Hormone replacement therapy (postmenopausal): Secondary | ICD-10-CM | POA: Diagnosis not present

## 2023-06-19 MED ORDER — ESTRADIOL 0.1 MG/GM VA CREA: TOPICAL_CREAM | VAGINAL | 2 refills | Status: DC

## 2023-06-19 MED ORDER — VALACYCLOVIR HCL 500 MG PO TABS: ORAL_TABLET | ORAL | 3 refills | Status: DC

## 2023-06-19 NOTE — Patient Instructions (Signed)

## 2023-06-20 ENCOUNTER — Encounter: Payer: Self-pay | Admitting: Physician Assistant

## 2023-06-20 LAB — COMPREHENSIVE METABOLIC PANEL
AG Ratio: 1.8 (calc) (ref 1.0–2.5)
ALT: 17 U/L (ref 6–29)
AST: 19 U/L (ref 10–30)
Albumin: 4.7 g/dL (ref 3.6–5.1)
Alkaline phosphatase (APISO): 40 U/L (ref 31–125)
BUN: 11 mg/dL (ref 7–25)
CO2: 28 mmol/L (ref 20–32)
Calcium: 9.7 mg/dL (ref 8.6–10.2)
Chloride: 102 mmol/L (ref 98–110)
Creat: 0.81 mg/dL (ref 0.50–0.97)
Globulin: 2.6 g/dL (ref 1.9–3.7)
Glucose, Bld: 70 mg/dL (ref 65–99)
Potassium: 3.5 mmol/L (ref 3.5–5.3)
Sodium: 139 mmol/L (ref 135–146)
Total Bilirubin: 0.3 mg/dL (ref 0.2–1.2)
Total Protein: 7.3 g/dL (ref 6.1–8.1)

## 2023-06-20 LAB — HEMOGLOBIN A1C
Hgb A1c MFr Bld: 5.5 %{Hb} (ref <5.7)
Mean Plasma Glucose: 111 mg/dL
eAG (mmol/L): 6.2 mmol/L

## 2023-06-20 LAB — CBC
HCT: 41.2 % (ref 35.0–45.0)
Hemoglobin: 13.3 g/dL (ref 11.7–15.5)
MCH: 31.6 pg (ref 27.0–33.0)
MCHC: 32.3 g/dL (ref 32.0–36.0)
MCV: 97.9 fL (ref 80.0–100.0)
MPV: 9.3 fL (ref 7.5–12.5)
Platelets: 363 10*3/uL (ref 140–400)
RBC: 4.21 10*6/uL (ref 3.80–5.10)
RDW: 11.7 % (ref 11.0–15.0)
WBC: 7.8 10*3/uL (ref 3.8–10.8)

## 2023-06-20 LAB — ESTRADIOL: Estradiol: 73 pg/mL

## 2023-06-20 LAB — LIPID PANEL
Cholesterol: 213 mg/dL — ABNORMAL HIGH (ref <200)
HDL: 60 mg/dL (ref >=50)
LDL Cholesterol (Calc): 138 mg/dL — ABNORMAL HIGH
Non-HDL Cholesterol (Calc): 153 mg/dL — ABNORMAL HIGH (ref <130)
Total CHOL/HDL Ratio: 3.6 (calc) (ref <5.0)
Triglycerides: 60 mg/dL (ref <150)

## 2023-06-20 LAB — TSH: TSH: 1.75 m[IU]/L

## 2023-06-20 LAB — FOLLICLE STIMULATING HORMONE: FSH: 7.4 m[IU]/mL

## 2023-06-22 ENCOUNTER — Other Ambulatory Visit: Payer: Self-pay | Admitting: Obstetrics and Gynecology

## 2023-06-22 DIAGNOSIS — Z1231 Encounter for screening mammogram for malignant neoplasm of breast: Secondary | ICD-10-CM

## 2023-07-21 ENCOUNTER — Ambulatory Visit: Payer: BC Managed Care – PPO

## 2023-08-18 ENCOUNTER — Ambulatory Visit: Payer: BC Managed Care – PPO

## 2023-08-30 ENCOUNTER — Other Ambulatory Visit: Payer: Self-pay | Admitting: Obstetrics and Gynecology

## 2023-08-30 DIAGNOSIS — B009 Herpesviral infection, unspecified: Secondary | ICD-10-CM

## 2023-08-30 NOTE — Telephone Encounter (Signed)
 Medication refill request: valtrex  500mg  Last AEX:  06-19-23 Next AEX: 06-20-24 Last MMG (if hormonal medication request): n/a Refill authorized: rx sent 06-19-23 for 100 with 3 refills. Please deny if appropriate

## 2023-09-04 DIAGNOSIS — Z01818 Encounter for other preprocedural examination: Secondary | ICD-10-CM | POA: Diagnosis not present

## 2023-10-27 DIAGNOSIS — K633 Ulcer of intestine: Secondary | ICD-10-CM | POA: Diagnosis not present

## 2023-10-27 DIAGNOSIS — Z1211 Encounter for screening for malignant neoplasm of colon: Secondary | ICD-10-CM | POA: Diagnosis not present

## 2023-10-27 DIAGNOSIS — K6389 Other specified diseases of intestine: Secondary | ICD-10-CM | POA: Diagnosis not present

## 2023-10-27 DIAGNOSIS — Z800 Family history of malignant neoplasm of digestive organs: Secondary | ICD-10-CM | POA: Diagnosis not present

## 2023-10-27 DIAGNOSIS — D1339 Benign neoplasm of other parts of small intestine: Secondary | ICD-10-CM | POA: Diagnosis not present

## 2023-11-01 DIAGNOSIS — M7711 Lateral epicondylitis, right elbow: Secondary | ICD-10-CM | POA: Insufficient documentation

## 2023-11-01 DIAGNOSIS — M25521 Pain in right elbow: Secondary | ICD-10-CM | POA: Insufficient documentation

## 2023-11-02 ENCOUNTER — Ambulatory Visit: Admitting: Physician Assistant

## 2023-11-02 VITALS — BP 100/60 | HR 85 | Temp 97.5°F | Resp 18 | Ht 65.0 in | Wt 143.0 lb

## 2023-11-02 DIAGNOSIS — E782 Mixed hyperlipidemia: Secondary | ICD-10-CM

## 2023-11-02 DIAGNOSIS — J3089 Other allergic rhinitis: Secondary | ICD-10-CM

## 2023-11-02 MED ORDER — TRIAMCINOLONE ACETONIDE 40 MG/ML IJ SUSP
60.0000 mg | Freq: Once | INTRAMUSCULAR | Status: AC
  Administered 2023-11-02: 60 mg via INTRAMUSCULAR

## 2023-11-02 NOTE — Progress Notes (Signed)
 Acute Office Visit  Subjective:    Patient ID: Ann Whitehead, female    DOB: 1981/06/15, 43 y.o.   MRN: [id number]  Chief Complaint  Patient presents with   Sinus Problem     Patient is in today for complaints of allergies -- every season she comes in and requests kenalog injection which helps her greatly through allergy season.  Her symptoms include runny nose, watery eyes and itchy eyes. She is currently taking zyrtec and sudafed also as well as flonase Denies fever, headache , malaise  Pt with history of elevated lipids - her last LDL done in November was 138 and before that around 106 - she eats healthy and exercises regularly.  Family history with elevated lipids/stroke Recommend to continue with diet and exercise  - repeat fasting labwork in 3-4 months and if not improved would take low dose statin   Past Medical History:  Diagnosis Date   Abnormal uterine bleeding (AUB)    Acid reflux disease    Acute hearing loss    Right   Anemia    COVID    March 2023 h/a and fever and nasal congestion. 06/03/2022   Dizziness    Elevated AST (SGOT) 10/2016   Endometriosis    Left ovarian cyst    Low ferritin    Migraines    without aura   STD (sexually transmitted disease)    HX HSV   Tinnitus, right ear     Past Surgical History:  Procedure Laterality Date   ABLATION ON ENDOMETRIOSIS N/A 07/21/2015   Procedure: FULGERATION OF ENDOMETRIOSIS, BIOPSY OF POSTERIOR CUL DE SAC;  Surgeon: Patton Salles, MD;  Location: [facility];  Service: Gynecology;  Laterality: N/A;   APPENDECTOMY  2004   AUGMENTATION MAMMAPLASTY  2011   CESAREAN SECTION  2003   CHOLECYSTECTOMY  2004   CYSTOSCOPY N/A 06/14/2022   Procedure: CYSTOSCOPY;  Surgeon: Patton Salles, MD;  Location: [facility];  Service: Gynecology;  Laterality: N/A;   LAPAROSCOPIC TUBAL LIGATION Right 07/21/2015   Procedure: LAPAROSCOPIC TUBAL LIGATION;  Surgeon: Patton Salles, MD;   Location: [facility];  Service: Gynecology;  Laterality: Right;   OVARIAN CYST REMOVAL Left 07/21/2015   Procedure: EXCISION PARA-OVARIAN CYST;  Surgeon: Patton Salles, MD;  Location: [facility];  Service: Gynecology;  Laterality: Left;   TOTAL LAPAROSCOPIC HYSTERECTOMY WITH SALPINGECTOMY N/A 06/14/2022   Procedure: TOTAL LAPAROSCOPIC HYSTERECTOMY WITH right SALPINGECTOMY; FULGERATION OF ENDOMETRIOSIS;  Surgeon: Patton Salles, MD;  Location: [facility];  Service: Gynecology;  Laterality: N/A;   UNILATERAL SALPINGECTOMY Left 07/21/2015   Procedure: DISTAL UNILATERAL SALPINGECTOMY;  Surgeon: Patton Salles, MD;  Location: [facility];  Service: Gynecology;  Laterality: Left;    Family History  Problem Relation Age of Onset   Colon polyps Mother        multiple pre-cancerous polyps on several colonoscopies   Diabetes Father    Hypertension Father    Hyperlipidemia Father    Diabetes Paternal Grandmother    Hyperlipidemia Paternal Grandmother    Hypertension Paternal Grandmother     Social History   Socioeconomic History   Marital status: Married    Spouse name: SCOTT   Number of children: 1   Years of education: 12 + 2   Highest education level: Tax adviser degree: occupational, Scientist, product/process development, or vocational program  Occupational History   Occupation: Psychologist, prison and probation services  Tobacco Use   Smoking status: Former    Current packs/day: 0.00    Types: Cigarettes    Quit date: 11/15/2021    Years since quitting: 1.9   Smokeless tobacco: Never   Tobacco comments:    smokes 5 cigs/week  Vaping Use   Vaping status: Never Used  Substance and Sexual Activity   Alcohol use: Not Currently   Drug use: No   Sexual activity: Yes    Partners: Male    Birth control/protection: Surgical    Comment: Tubal  Other Topics Concern   Not on file  Social History Narrative   Not on file   Social Drivers of Health   Financial Resource Strain: Low Risk  (11/02/2023)    Overall Financial Resource Strain (CARDIA)    Difficulty of Paying Living Expenses: Not hard at all  Food Insecurity: No Food Insecurity (11/02/2023)   Hunger Vital Sign    Worried About Running Out of Food in the Last Year: Never true    Ran Out of Food in the Last Year: Never true  Transportation Needs: No Transportation Needs (11/02/2023)   PRAPARE - Administrator, Civil Service (Medical): No    Lack of Transportation (Non-Medical): No  Physical Activity: Sufficiently Active (11/02/2023)   Exercise Vital Sign    Days of Exercise per Week: 5 days    Minutes of Exercise per Session: 70 min  Stress: No Stress Concern Present (11/02/2023)   [employer] of Occupational Health - Occupational Stress Questionnaire    Feeling of Stress : Only a little  Social Connections: Moderately Integrated (11/02/2023)   Social Connection and Isolation Panel [NHANES]    Frequency of Communication with Friends and Family: Twice a week    Frequency of Social Gatherings with Friends and Family: Once a week    Attends Religious Services: 1 to 4 times per year    Active Member of [employer] or Organizations: No    Attends Engineer, structural: Not on file    Marital Status: Married  Catering manager Violence: Not on file     Current Outpatient Medications:    Ascorbic Acid (VITAMIN C PO), Take by mouth., Disp: , Rfl:    BIOTIN PO, Take by mouth., Disp: , Rfl:    ELDERBERRY PO, Take 1 tablet by mouth daily., Disp: , Rfl:    EPINEPHrine 0.3 mg/0.3 mL IJ SOAJ injection, Inject 0.3 mg into the muscle as needed for anaphylaxis., Disp: 1 each, Rfl: 3   estradiol (ESTRACE) 0.1 MG/GM vaginal cream, Use 1/2 g vaginally every night for the first 2 weeks, then use 1/2 g vaginally two or three times per week as needed to maintain symptom relief., Disp: 42.5 g, Rfl: 2   Multiple Vitamins-Minerals (ZINC PO), Take by mouth., Disp: , Rfl:    Omega-3 Fatty Acids (FISH OIL PO), Take by mouth., Disp: ,  Rfl:    omeprazole (PRILOSEC) 40 MG capsule, Take 40 mg by mouth every morning., Disp: , Rfl:    Turmeric 500 MG CAPS, Take by mouth., Disp: , Rfl:    valACYclovir (VALTREX) 500 MG tablet, Take one tablet (500 mg) by mouth daily for infection prevention. Take one tablet (500 mg) by mouth twice a day for 3 days for an outbreak., Disp: 100 tablet, Rfl: 2  Current Facility-Administered Medications:    triamcinolone acetonide (KENALOG-40) injection 60 mg, 60 mg, Intramuscular, Once,    Allergies  Allergen Reactions   Venofer [Iron Sucrose] Hives  Patient was discharged from the clinic after receiving Venofer in stable condition. She returned 30 minutes later concerned that she was having a reaction. She presented very flushed and complained of tingling in her hands and feet. She does not complain of respiratory concerns. She does feel very "jittery". IV was started and she was given Benadryl 50 mg and Pepcid 20 mg. She was feeling some improvement after the benadryl.   CONSTITUTIONAL: Negative for chills, fatigue, fever, E/N/T: see HPI CARDIOVASCULAR: Negative for chest pain, RESPIRATORY: Negative for recent cough and dyspnea.           Objective:  PHYSICAL EXAM:   VS: BP 100/60 (BP Location: Right Arm, Patient Position: Sitting, Cuff Size: Normal)   Pulse 85   Temp (!) 97.5 F (36.4 C) (Temporal)   Resp 18   Ht 5\' 5"  (1.651 m)   Wt 143 lb (64.9 kg)   LMP 07/12/2022   SpO2 98%   BMI 23.80 kg/m   GEN: Well nourished, well developed, in no acute distress  HEENT: normal external ears and nose - normal external auditory canals and TMS  - Lips, Teeth and Gums - normal  Oropharynx - mild erythema  Cardiac: RRR; no murmurs, Respiratory:  normal respiratory rate and pattern with no distress - normal breath sounds with no rales, rhonchi, wheezes or rubs  Psych: euthymic mood, appropriate affect and demeanor    Wt Readings from Last 3 Encounters:  11/02/23 143 lb (64.9 kg)   06/19/23 142 lb (64.4 kg)  01/10/23 138 lb (62.6 kg)    There are no preventive care reminders to display for this patient.  There are no preventive care reminders to display for this patient.        Assessment & Plan:   Problem List Items Addressed This Visit       Respiratory   Allergic rhinitis due to allergen - Primary   Relevant Medications   triamcinolone acetonide (KENALOG-40) injection 60 mg     Other   Mixed hyperlipidemia   Relevant Orders   Comprehensive metabolic panel   Lipid panel      Meds ordered this encounter  Medications   triamcinolone acetonide (KENALOG-40) injection 60 mg     SARA R Archimedes Harold, PA-C

## 2023-12-14 ENCOUNTER — Encounter: Payer: Self-pay | Admitting: Physician Assistant

## 2023-12-14 ENCOUNTER — Other Ambulatory Visit: Payer: Self-pay | Admitting: Physician Assistant

## 2023-12-14 DIAGNOSIS — Z202 Contact with and (suspected) exposure to infections with a predominantly sexual mode of transmission: Secondary | ICD-10-CM

## 2023-12-15 ENCOUNTER — Other Ambulatory Visit

## 2023-12-15 DIAGNOSIS — Z202 Contact with and (suspected) exposure to infections with a predominantly sexual mode of transmission: Secondary | ICD-10-CM

## 2023-12-15 DIAGNOSIS — E782 Mixed hyperlipidemia: Secondary | ICD-10-CM

## 2023-12-16 LAB — LIPID PANEL
Chol/HDL Ratio: 2.6 ratio (ref 0.0–4.4)
Cholesterol, Total: 181 mg/dL (ref 100–199)
HDL: 69 mg/dL (ref >39)
LDL Chol Calc (NIH): 102 mg/dL — ABNORMAL HIGH (ref 0–99)
Triglycerides: 52 mg/dL (ref 0–149)
VLDL Cholesterol Cal: 10 mg/dL (ref 5–40)

## 2023-12-16 LAB — HEPB+HEPC+HIV PANEL
HIV Screen 4th Generation wRfx: NONREACTIVE
Hep B C IgM: NEGATIVE
Hep B Core Total Ab: NEGATIVE
Hep B E Ab: NONREACTIVE
Hep B E Ag: NEGATIVE
Hep B Surface Ab, Qual: REACTIVE
Hep C Virus Ab: NONREACTIVE
Hepatitis B Surface Ag: NEGATIVE

## 2023-12-16 LAB — COMPREHENSIVE METABOLIC PANEL WITH GFR
ALT: 13 IU/L (ref 0–32)
AST: 18 IU/L (ref 0–40)
Albumin: 4.1 g/dL (ref 3.9–4.9)
Alkaline Phosphatase: 39 IU/L — ABNORMAL LOW (ref 44–121)
BUN/Creatinine Ratio: 19 (ref 9–23)
BUN: 18 mg/dL (ref 6–24)
Bilirubin Total: <0.2 mg/dL (ref 0.0–1.2)
CO2: 21 mmol/L (ref 20–29)
Calcium: 9.4 mg/dL (ref 8.7–10.2)
Chloride: 103 mmol/L (ref 96–106)
Creatinine, Ser: 0.93 mg/dL (ref 0.57–1.00)
Globulin, Total: 2.4 g/dL (ref 1.5–4.5)
Glucose: 82 mg/dL (ref 70–99)
Potassium: 4.1 mmol/L (ref 3.5–5.2)
Sodium: 138 mmol/L (ref 134–144)
Total Protein: 6.5 g/dL (ref 6.0–8.5)
eGFR: 80 mL/min/{1.73_m2} (ref >59)

## 2023-12-17 ENCOUNTER — Encounter: Payer: Self-pay | Admitting: Physician Assistant

## 2023-12-26 ENCOUNTER — Encounter: Payer: Self-pay | Admitting: Physician Assistant

## 2023-12-26 ENCOUNTER — Ambulatory Visit: Admitting: Physician Assistant

## 2023-12-26 VITALS — BP 110/70 | HR 70 | Temp 98.0°F | Resp 18 | Ht 65.0 in | Wt 141.2 lb

## 2023-12-26 DIAGNOSIS — R5383 Other fatigue: Secondary | ICD-10-CM | POA: Diagnosis not present

## 2023-12-26 DIAGNOSIS — R232 Flushing: Secondary | ICD-10-CM

## 2023-12-26 DIAGNOSIS — R635 Abnormal weight gain: Secondary | ICD-10-CM | POA: Diagnosis not present

## 2023-12-26 DIAGNOSIS — F419 Anxiety disorder, unspecified: Secondary | ICD-10-CM | POA: Diagnosis not present

## 2023-12-26 NOTE — Progress Notes (Signed)
 Subjective:  Patient ID: Ann Whitehead, female    DOB: [birth year]  Age: 43 y.o. MRN: [id number]  Chief Complaint  Patient presents with   Hot Flashes   Insomnia    HPI Pt in today requesting labwork to include hormone panel.  She states she is bothered with hot flashes during day but especially at night, cannot lose weight easily despite working out and watching her diet, has vaginal dryness (is using estrace  as needed) and has had partial hysterectomy - and also feels anxious and nervous at times (but does not want to try anxiety medication) She would like hormone levels checked including cortisol level Other medical history includes hyperlipidemia Pt did have GYN check estradiol  (73 in 11/24) and FSH (7.4 in 11/24)     01/10/2023    9:30 AM 07/23/2021   10:19 AM 12/07/2020    2:27 PM  Depression screen PHQ 2/9  Decreased Interest 0 0 0  Down, Depressed, Hopeless 0 0 0  PHQ - 2 Score 0 0 0        07/23/2021   10:19 AM 09/17/2021   10:15 AM 06/14/2022    6:07 AM 07/26/2022   12:00 PM 01/10/2023    9:27 AM  Fall Risk  Falls in the past year? 0    0  Was there an injury with Fall? 0    0  Fall Risk Category Calculator 0    0  Fall Risk Category (Retired) Low      (RETIRED) Patient Fall Risk Level Low fall risk Low fall risk Low fall risk Low fall risk   Patient at Risk for Falls Due to No Fall Risks    No Fall Risks  Fall risk Follow up Falls evaluation completed    Falls evaluation completed     ROS CONSTITUTIONAL: see HPI  CARDIOVASCULAR: Negative for chest pain, dizziness,  RESPIRATORY: Negative for recent cough and dyspnea.  GASTROINTESTINAL: Negative for abdominal pain, acid reflux symptoms, constipation, diarrhea, nausea and vomiting.  GU - see HPI PSYCHIATRIC: see HPI   Current Outpatient Medications:    Ascorbic Acid (VITAMIN C PO), Take by mouth., Disp: , Rfl:    BIOTIN PO, Take by mouth., Disp: , Rfl:    ELDERBERRY PO, Take 1 tablet by mouth daily., Disp: ,  Rfl:    EPINEPHrine  0.3 mg/0.3 mL IJ SOAJ injection, Inject 0.3 mg into the muscle as needed for anaphylaxis., Disp: 1 each, Rfl: 3   estradiol  (ESTRACE ) 0.1 MG/GM vaginal cream, Use 1/2 g vaginally every night for the first 2 weeks, then use 1/2 g vaginally two or three times per week as needed to maintain symptom relief., Disp: 42.5 g, Rfl: 2   Multiple Vitamins-Minerals (ZINC  PO), Take by mouth., Disp: , Rfl:    Omega-3 Fatty Acids (FISH OIL PO), Take by mouth., Disp: , Rfl:    omeprazole (PRILOSEC) 40 MG capsule, Take 40 mg by mouth every morning., Disp: , Rfl:    Turmeric 500 MG CAPS, Take by mouth., Disp: , Rfl:    valACYclovir  (VALTREX ) 500 MG tablet, Take one tablet (500 mg) by mouth daily for infection prevention. Take one tablet (500 mg) by mouth twice a day for 3 days for an outbreak., Disp: 100 tablet, Rfl: 2  Past Medical History:  Diagnosis Date   Abnormal uterine bleeding (AUB)    Acid reflux disease    Acute hearing loss    Right   Anemia    COVID  March 2023 h/a and fever and nasal congestion. 06/03/2022   Dizziness    Elevated AST (SGOT) 10/2016   Endometriosis    Left ovarian cyst    Low ferritin    Migraines    without aura   STD (sexually transmitted disease)    HX HSV   Tinnitus, right ear    Objective:  PHYSICAL EXAM:   BP 110/70   Pulse 70   Temp 98 F (36.7 C) (Temporal)   Resp 18   Ht 5\' 5"  (1.651 m)   Wt 141 lb 3.2 oz (64 kg)   LMP 07/12/2022   SpO2 96%   BMI 23.50 kg/m    GEN: Well nourished, well developed, in no acute distress   Cardiac: RRR; no murmurs,  Respiratory:  normal respiratory rate and pattern with no distress - normal breath sounds with no rales, rhonchi, wheezes or rubs  Skin: warm and dry, no rash   Psych: euthymic mood, appropriate affect and demeanor  Assessment & Plan:    Hot flashes -     CBC with Differential/Platelet -     Comprehensive metabolic panel with GFR -     Thyroid  Panel With TSH -     FSH/LH -      Estradiol  -     Progesterone -     Testosterone -     VITAMIN D  25 Hydroxy (Vit-D Deficiency, Fractures) -     Cortisol-am, blood; Future  Other fatigue -     CBC with Differential/Platelet -     Comprehensive metabolic panel with GFR -     Thyroid  Panel With TSH -     FSH/LH -     Estradiol  -     Progesterone -     Testosterone -     VITAMIN D  25 Hydroxy (Vit-D Deficiency, Fractures) -     Cortisol-am, blood; Future  Weight gain -     CBC with Differential/Platelet -     Comprehensive metabolic panel with GFR -     Thyroid  Panel With TSH -     FSH/LH -     Estradiol  -     Progesterone -     Testosterone -     Cortisol-am, blood; Future  Anxiety -     CBC with Differential/Platelet -     Comprehensive metabolic panel with GFR -     Thyroid  Panel With TSH -     FSH/LH -     Estradiol  -     Progesterone -     Testosterone -     Cortisol-am, blood; Future     Follow-up: Return if symptoms worsen or fail to improve. - will also set up for overnight dexamethasone  suppression test  An After Visit Summary was printed and given to the patient.  Anthonette Bastos [facility] [phone]

## 2023-12-27 ENCOUNTER — Other Ambulatory Visit: Payer: Self-pay | Admitting: Physician Assistant

## 2023-12-27 ENCOUNTER — Ambulatory Visit: Payer: Self-pay | Admitting: Physician Assistant

## 2023-12-27 DIAGNOSIS — R899 Unspecified abnormal finding in specimens from other organs, systems and tissues: Secondary | ICD-10-CM

## 2023-12-27 LAB — CBC WITH DIFFERENTIAL/PLATELET
Basophils Absolute: 0 10*3/uL (ref 0.0–0.2)
Basos: 0 %
EOS (ABSOLUTE): 0.1 10*3/uL (ref 0.0–0.4)
Eos: 1 %
Hematocrit: 42.4 % (ref 34.0–46.6)
Hemoglobin: 13.9 g/dL (ref 11.1–15.9)
Immature Grans (Abs): 0 10*3/uL (ref 0.0–0.1)
Immature Granulocytes: 0 %
Lymphocytes Absolute: 2.9 10*3/uL (ref 0.7–3.1)
Lymphs: 28 %
MCH: 33.1 pg — ABNORMAL HIGH (ref 26.6–33.0)
MCHC: 32.8 g/dL (ref 31.5–35.7)
MCV: 101 fL — ABNORMAL HIGH (ref 79–97)
Monocytes Absolute: 0.9 10*3/uL (ref 0.1–0.9)
Monocytes: 9 %
Neutrophils Absolute: 6.3 10*3/uL (ref 1.4–7.0)
Neutrophils: 62 %
Platelets: 346 10*3/uL (ref 150–450)
RBC: 4.2 x10E6/uL (ref 3.77–5.28)
RDW: 11.9 % (ref 11.7–15.4)
WBC: 10.2 10*3/uL (ref 3.4–10.8)

## 2023-12-27 LAB — COMPREHENSIVE METABOLIC PANEL WITH GFR
ALT: 13 IU/L (ref 0–32)
AST: 21 IU/L (ref 0–40)
Albumin: 4.6 g/dL (ref 3.9–4.9)
Alkaline Phosphatase: 45 IU/L (ref 44–121)
BUN/Creatinine Ratio: 27 — ABNORMAL HIGH (ref 9–23)
BUN: 21 mg/dL (ref 6–24)
Bilirubin Total: 0.2 mg/dL (ref 0.0–1.2)
CO2: 22 mmol/L (ref 20–29)
Calcium: 9.6 mg/dL (ref 8.7–10.2)
Chloride: 99 mmol/L (ref 96–106)
Creatinine, Ser: 0.79 mg/dL (ref 0.57–1.00)
Globulin, Total: 2.6 g/dL (ref 1.5–4.5)
Glucose: 81 mg/dL (ref 70–99)
Potassium: 4.6 mmol/L (ref 3.5–5.2)
Sodium: 137 mmol/L (ref 134–144)
Total Protein: 7.2 g/dL (ref 6.0–8.5)
eGFR: 97 mL/min/{1.73_m2} (ref >59)

## 2023-12-27 LAB — THYROID PANEL WITH TSH
Free Thyroxine Index: 2.9 (ref 1.2–4.9)
T3 Uptake Ratio: 26 % (ref 24–39)
T4, Total: 11.1 ug/dL (ref 4.5–12.0)
TSH: 1.41 u[IU]/mL (ref 0.450–4.500)

## 2023-12-27 LAB — PROGESTERONE: Progesterone: 0.3 ng/mL

## 2023-12-27 LAB — ESTRADIOL: Estradiol: 656 pg/mL

## 2023-12-27 LAB — VITAMIN D 25 HYDROXY (VIT D DEFICIENCY, FRACTURES): Vit D, 25-Hydroxy: 35.7 ng/mL (ref 30.0–100.0)

## 2023-12-27 LAB — FSH/LH
FSH: 2.1 m[IU]/mL
LH: 4.3 m[IU]/mL

## 2023-12-27 LAB — TESTOSTERONE: Testosterone: 4 ng/dL — ABNORMAL LOW (ref 8–60)

## 2023-12-29 ENCOUNTER — Encounter: Payer: Self-pay | Admitting: Physician Assistant

## 2024-01-03 ENCOUNTER — Other Ambulatory Visit

## 2024-01-03 DIAGNOSIS — F419 Anxiety disorder, unspecified: Secondary | ICD-10-CM

## 2024-01-03 DIAGNOSIS — R232 Flushing: Secondary | ICD-10-CM

## 2024-01-03 DIAGNOSIS — R635 Abnormal weight gain: Secondary | ICD-10-CM | POA: Diagnosis not present

## 2024-01-03 DIAGNOSIS — R899 Unspecified abnormal finding in specimens from other organs, systems and tissues: Secondary | ICD-10-CM | POA: Diagnosis not present

## 2024-01-03 DIAGNOSIS — R5383 Other fatigue: Secondary | ICD-10-CM | POA: Diagnosis not present

## 2024-01-04 LAB — CORTISOL-AM, BLOOD: Cortisol - AM: 0.6 ug/dL — ABNORMAL LOW (ref 6.2–19.4)

## 2024-01-04 LAB — B12 AND FOLATE PANEL
Folate: 8.1 ng/mL (ref >3.0)
Vitamin B-12: 376 pg/mL (ref 232–1245)

## 2024-01-04 LAB — ESTRADIOL: Estradiol: 178 pg/mL

## 2024-02-15 DIAGNOSIS — M7711 Lateral epicondylitis, right elbow: Secondary | ICD-10-CM | POA: Diagnosis not present

## 2024-03-05 ENCOUNTER — Ambulatory Visit: Admitting: Physician Assistant

## 2024-03-07 ENCOUNTER — Encounter: Payer: Self-pay | Admitting: Physician Assistant

## 2024-03-07 ENCOUNTER — Ambulatory Visit: Admitting: Physician Assistant

## 2024-03-07 VITALS — BP 108/70 | HR 74 | Temp 97.5°F | Resp 16 | Ht 64.0 in | Wt 144.0 lb

## 2024-03-07 DIAGNOSIS — R41840 Attention and concentration deficit: Secondary | ICD-10-CM

## 2024-03-07 MED ORDER — LISDEXAMFETAMINE DIMESYLATE 30 MG PO CAPS: 30.0000 mg | ORAL_CAPSULE | Freq: Every day | ORAL | 0 refills | Status: DC

## 2024-03-07 NOTE — Progress Notes (Signed)
 Subjective:  Patient ID: Ann Whitehead, female    DOB: 1981-04-30  Age: 43 y.o. MRN: [id number]  Chief Complaint  Patient presents with   Problems to focus   Weight Loss    HPI Pt in today to discuss trying medication to help with inattentiveness and feeling like she has 'brain fog' - this has been worsened over the past 6 months She feels like she can complete tasks but feels tired and unfocused - has not been on medication in the past She has had recent labwork evaluation all of which has been normal She eats healthy and exercises - she can however have a tendency to oversnack/eat with junk food     01/10/2023    9:30 AM 07/23/2021   10:19 AM 12/07/2020    2:27 PM  Depression screen PHQ 2/9  Decreased Interest 0 0 0  Down, Depressed, Hopeless 0 0 0  PHQ - 2 Score 0 0 0        07/23/2021   10:19 AM 09/17/2021   10:15 AM 06/14/2022    6:07 AM 07/26/2022   12:00 PM 01/10/2023    9:27 AM  Fall Risk  Falls in the past year? 0    0  Was there an injury with Fall? 0    0  Fall Risk Category Calculator 0    0  Fall Risk Category (Retired) Low       (RETIRED) Patient Fall Risk Level Low fall risk  Low fall risk  Low fall risk  Low fall risk    Patient at Risk for Falls Due to No Fall Risks    No Fall Risks  Fall risk Follow up Falls evaluation completed     Falls evaluation completed     Data saved with a previous flowsheet row definition     ROS CONSTITUTIONAL: see HPI CARDIOVASCULAR: Negative for chest pain, dizziness, palpitations  RESPIRATORY: Negative for recent cough and dyspnea.   PSYCHIATRIC: Negative for sleep disturbance and to question depression screen.  Negative for depression, negative for anhedonia.    Current Outpatient Medications:    Ascorbic Acid (VITAMIN C PO), Take by mouth., Disp: , Rfl:    Berberine Chloride 500 MG CAPS, Take by mouth., Disp: , Rfl:    BIOTIN PO, Take by mouth., Disp: , Rfl:    ELDERBERRY PO, Take 1 tablet by mouth daily., Disp: ,  Rfl:    EPINEPHrine  0.3 mg/0.3 mL IJ SOAJ injection, Inject 0.3 mg into the muscle as needed for anaphylaxis., Disp: 1 each, Rfl: 3   estradiol  (ESTRACE ) 0.1 MG/GM vaginal cream, Use 1/2 g vaginally every night for the first 2 weeks, then use 1/2 g vaginally two or three times per week as needed to maintain symptom relief., Disp: 42.5 g, Rfl: 2   lisdexamfetamine (VYVANSE ) 30 MG capsule, Take 1 capsule (30 mg total) by mouth daily., Disp: 30 capsule, Rfl: 0   Multiple Vitamins-Minerals (ZINC  PO), Take by mouth., Disp: , Rfl:    Omega-3 Fatty Acids (FISH OIL PO), Take by mouth., Disp: , Rfl:    omeprazole (PRILOSEC) 40 MG capsule, Take 40 mg by mouth every morning., Disp: , Rfl:    Turmeric 500 MG CAPS, Take by mouth., Disp: , Rfl:    valACYclovir  (VALTREX ) 500 MG tablet, Take one tablet (500 mg) by mouth daily for infection prevention. Take one tablet (500 mg) by mouth twice a day for 3 days for an outbreak., Disp: 100 tablet, Rfl: 2  Past Medical History:  Diagnosis Date   Abnormal uterine bleeding (AUB)    Acid reflux disease    Acute hearing loss    Right   Anemia    COVID    March 2023 h/a and fever and nasal congestion. 06/03/2022   Dizziness    Elevated AST (SGOT) 10/2016   Endometriosis    Left ovarian cyst    Low ferritin    Migraines    without aura   STD (sexually transmitted disease)    HX HSV   Tinnitus, right ear    Objective:  PHYSICAL EXAM:   BP 108/70   Pulse 74   Temp (!) 97.5 F (36.4 C)   Resp 16   Ht 5' 4 (1.626 m)   Wt 144 lb (65.3 kg)   LMP 07/12/2022   SpO2 99%   BMI 24.72 kg/m    GEN: Well nourished, well developed, in no acute distress  Cardiac: RRR; no murmurs, Respiratory:  normal respiratory rate and pattern with no distress - normal breath sounds with no rales, rhonchi, wheezes or rubs  Skin: warm and dry, no rash  Neuro:  Alert and Oriented x 3,  - CN II-Xii grossly intact Psych: euthymic mood, appropriate affect and  demeanor  Assessment & Plan:    Inattention -     Lisdexamfetamine Dimesylate ; Take 1 capsule (30 mg total) by mouth daily.  Dispense: 30 capsule; Refill: 0     Follow-up: Return in about 4 weeks (around 04/04/2024) for follow-up.  An After Visit Summary was printed and given to the patient.  CAMIE JONELLE NICHOLAUS DEVONNA [facility] [phone]

## 2024-03-09 DIAGNOSIS — M25521 Pain in right elbow: Secondary | ICD-10-CM | POA: Diagnosis not present

## 2024-03-26 DIAGNOSIS — M25521 Pain in right elbow: Secondary | ICD-10-CM | POA: Diagnosis not present

## 2024-03-27 ENCOUNTER — Encounter: Payer: Self-pay | Admitting: Physician Assistant

## 2024-04-02 ENCOUNTER — Ambulatory Visit: Admitting: Physician Assistant

## 2024-04-03 ENCOUNTER — Ambulatory Visit: Admitting: Physician Assistant

## 2024-04-26 DIAGNOSIS — M25521 Pain in right elbow: Secondary | ICD-10-CM | POA: Diagnosis not present

## 2024-05-24 DIAGNOSIS — M25521 Pain in right elbow: Secondary | ICD-10-CM | POA: Diagnosis not present

## 2024-06-20 ENCOUNTER — Ambulatory Visit: Payer: BC Managed Care – PPO | Admitting: Obstetrics and Gynecology

## 2024-06-26 ENCOUNTER — Telehealth: Admitting: Physician Assistant

## 2024-06-26 ENCOUNTER — Telehealth

## 2024-06-26 DIAGNOSIS — J019 Acute sinusitis, unspecified: Secondary | ICD-10-CM

## 2024-06-26 DIAGNOSIS — B9689 Other specified bacterial agents as the cause of diseases classified elsewhere: Secondary | ICD-10-CM | POA: Diagnosis not present

## 2024-06-26 MED ORDER — PREDNISONE 10 MG (21) PO TBPK: ORAL_TABLET | ORAL | 0 refills | Status: DC

## 2024-06-26 MED ORDER — IPRATROPIUM BROMIDE 0.03 % NA SOLN: 2.0000 | Freq: Two times a day (BID) | NASAL | 0 refills | Status: DC

## 2024-06-26 MED ORDER — AMOXICILLIN-POT CLAVULANATE 875-125 MG PO TABS: 1.0000 | ORAL_TABLET | Freq: Two times a day (BID) | ORAL | 0 refills | Status: DC

## 2024-06-26 NOTE — Progress Notes (Signed)
 Virtual Visit Consent   Ann Whitehead, you are scheduled for a virtual visit with a [facility] provider today. Just as with appointments in the office, your consent must be obtained to participate. Your consent will be active for this visit and any virtual visit you may have with one of our providers in the next 365 days. If you have a MyChart account, a copy of this consent can be sent to you electronically.  As this is a virtual visit, video technology does not allow for your provider to perform a traditional examination. This may limit your provider's ability to fully assess your condition. If your provider identifies any concerns that need to be evaluated in person or the need to arrange testing (such as labs, EKG, etc.), we will make arrangements to do so. Although advances in technology are sophisticated, we cannot ensure that it will always work on either your end or our end. If the connection with a video visit is poor, the visit may have to be switched to a telephone visit. With either a video or telephone visit, we are not always able to ensure that we have a secure connection.  By engaging in this virtual visit, you consent to the provision of healthcare and authorize for your insurance to be billed (if applicable) for the services provided during this visit. Depending on your insurance coverage, you may receive a charge related to this service.  I need to obtain your verbal consent now. Are you willing to proceed with your visit today? Ann Whitehead has provided verbal consent on 06/26/2024 for a virtual visit (video or telephone). Ann Whitehead, [place]  Date: 06/26/2024 3:59 PM   Virtual Visit via Video Note   I, Ann Whitehead, connected with  Kerstie Soderberg  ([id number], [birth year]) on 06/26/24 at  4:00 PM EST by a video-enabled telemedicine application and verified that I am speaking with the correct person using two identifiers.  Location: Patient: Virtual Visit  Location Patient: Home Provider: Virtual Visit Location Provider: Home Office   I discussed the limitations of evaluation and management by telemedicine and the availability of in person appointments. The patient expressed understanding and agreed to proceed.    History of Present Illness: Ann Whitehead is a 43 y.o. who identifies as a female who was assigned female at birth, and is being seen today for possible sinusitis. Endorses 1.5 weeks of nasal congestion, sinus pressure, now with sinus pain. Notes her nasal passages are very irritated. Denies fever, chills. Denies chest congestion. Some post-nasal drip and ear pressure/popping. Denies recent travel.   OTC -- Sudafed, Saline nasal rinse.  HPI: HPI  Problems:  Patient Active Problem List   Diagnosis Date Noted   Mixed hyperlipidemia 11/02/2023   Lateral epicondylitis of right elbow 11/01/2023   Arthralgia of right elbow 11/01/2023   Acute laryngopharyngitis 01/10/2023   Iron  deficiency anemia due to chronic blood loss 05/05/2021   Allergic rhinitis due to allergen 11/05/2019   Sensorineural hearing loss (SNHL) of right ear 08/20/2018    Allergies:  Allergies  Allergen Reactions   Iron  Sucrose Hives and Anaphylaxis    Patient was discharged from the clinic after receiving Venofer  in stable condition. She returned 30 minutes later concerned that she was having a reaction. She presented very flushed and complained of tingling in her hands and feet. She does not complain of respiratory concerns. She does feel very jittery. IV was started and she was given Benadryl  50 mg and Pepcid  20 mg.  She was feeling some improvement after the benadryl .  iron  sucrose   Medications:  Current Outpatient Medications:    amoxicillin -clavulanate (AUGMENTIN ) 875-125 MG tablet, Take 1 tablet by mouth 2 (two) times daily., Disp: 14 tablet, Rfl: 0   ipratropium (ATROVENT) 0.03 % nasal spray, Place 2 sprays into both nostrils every 12 (twelve) hours.,  Disp: 30 mL, Rfl: 0   predniSONE  (STERAPRED UNI-PAK 21 TAB) 10 MG (21) TBPK tablet, Take following package directions, Disp: 21 tablet, Rfl: 0   Ascorbic Acid (VITAMIN C PO), Take by mouth., Disp: , Rfl:    Berberine Chloride 500 MG CAPS, Take by mouth., Disp: , Rfl:    BIOTIN PO, Take by mouth., Disp: , Rfl:    ELDERBERRY PO, Take 1 tablet by mouth daily., Disp: , Rfl:    EPINEPHrine  0.3 mg/0.3 mL IJ SOAJ injection, Inject 0.3 mg into the muscle as needed for anaphylaxis., Disp: 1 each, Rfl: 3   estradiol  (ESTRACE ) 0.1 MG/GM vaginal cream, Use 1/2 g vaginally every night for the first 2 weeks, then use 1/2 g vaginally two or three times per week as needed to maintain symptom relief., Disp: 42.5 g, Rfl: 2   Multiple Vitamins-Minerals (ZINC  PO), Take by mouth., Disp: , Rfl:    Omega-3 Fatty Acids (FISH OIL PO), Take by mouth., Disp: , Rfl:    omeprazole (PRILOSEC) 40 MG capsule, Take 40 mg by mouth every morning., Disp: , Rfl:    Turmeric 500 MG CAPS, Take by mouth., Disp: , Rfl:    valACYclovir  (VALTREX ) 500 MG tablet, Take one tablet (500 mg) by mouth daily for infection prevention. Take one tablet (500 mg) by mouth twice a day for 3 days for an outbreak., Disp: 100 tablet, Rfl: 2  Observations/Objective: Patient is well-developed, well-nourished in no acute distress.  Resting comfortably  at home.  Head is normocephalic, atraumatic.  No labored breathing. Speech is clear and coherent with logical content.  Patient is alert and oriented at baseline.   Assessment and Plan: 1. Acute bacterial sinusitis (Primary) - predniSONE  (STERAPRED UNI-PAK 21 TAB) 10 MG (21) TBPK tablet; Take following package directions  Dispense: 21 tablet; Refill: 0 - ipratropium (ATROVENT) 0.03 % nasal spray; Place 2 sprays into both nostrils every 12 (twelve) hours.  Dispense: 30 mL; Refill: 0 - amoxicillin -clavulanate (AUGMENTIN ) 875-125 MG tablet; Take 1 tablet by mouth 2 (two) times daily.  Dispense: 14 tablet;  Refill: 0  Rx Augmentin .  Increase fluids.  Rest.  Saline nasal spray.  Probiotic.  Mucinex as directed.  Humidifier in bedroom. Ipratropium nasal spray and .  Call or return to clinic if symptoms are not improving.   Follow Up Instructions: I discussed the assessment and treatment plan with the patient. The patient was provided an opportunity to ask questions and all were answered. The patient agreed with the plan and demonstrated an understanding of the instructions.  A copy of instructions were sent to the patient via MyChart unless otherwise noted below.   The patient was advised to call back or seek an in-person evaluation if the symptoms worsen or if the condition fails to improve as anticipated.    Ann Velma Lunger, PA-C

## 2024-06-26 NOTE — Patient Instructions (Signed)
 Ann Whitehead, thank you for joining Ann Velma Lunger, PA-C for today's virtual visit.  While this provider is not your primary care provider (PCP), if your PCP is located in our provider database this encounter information will be shared with them immediately following your visit.   A [facility] MyChart account gives you access to today's visit and all your visits, tests, and labs performed at [facility]  click here if you don't have a [facility] MyChart account or go to [email or web address]  Consent: (Patient) Ann Whitehead provided verbal consent for this virtual visit at the beginning of the encounter.  Current Medications:  Current Outpatient Medications:    Ascorbic Acid (VITAMIN C PO), Take by mouth., Disp: , Rfl:    Berberine Chloride 500 MG CAPS, Take by mouth., Disp: , Rfl:    BIOTIN PO, Take by mouth., Disp: , Rfl:    ELDERBERRY PO, Take 1 tablet by mouth daily., Disp: , Rfl:    EPINEPHrine  0.3 mg/0.3 mL IJ SOAJ injection, Inject 0.3 mg into the muscle as needed for anaphylaxis., Disp: 1 each, Rfl: 3   estradiol  (ESTRACE ) 0.1 MG/GM vaginal cream, Use 1/2 g vaginally every night for the first 2 weeks, then use 1/2 g vaginally two or three times per week as needed to maintain symptom relief., Disp: 42.5 g, Rfl: 2   lisdexamfetamine (VYVANSE ) 30 MG capsule, Take 1 capsule (30 mg total) by mouth daily., Disp: 30 capsule, Rfl: 0   Multiple Vitamins-Minerals (ZINC  PO), Take by mouth., Disp: , Rfl:    Omega-3 Fatty Acids (FISH OIL PO), Take by mouth., Disp: , Rfl:    omeprazole (PRILOSEC) 40 MG capsule, Take 40 mg by mouth every morning., Disp: , Rfl:    Turmeric 500 MG CAPS, Take by mouth., Disp: , Rfl:    valACYclovir  (VALTREX ) 500 MG tablet, Take one tablet (500 mg) by mouth daily for infection prevention. Take one tablet (500 mg) by mouth twice a day for 3 days for an outbreak., Disp: 100 tablet, Rfl: 2   Medications ordered in this encounter:  No orders of  the defined types were placed in this encounter.    *If you need refills on other medications prior to your next appointment, please contact your pharmacy*  Follow-Up: Call back or seek an in-person evaluation if the symptoms worsen or if the condition fails to improve as anticipated.  [facility] [phone]  Other Instructions Please take antibiotic as directed.  Increase fluid intake.  Use Saline nasal spray.  Take a daily multivitamin. Use the steroid pack and nasal spray as directed.  Place a humidifier in the bedroom.  Please call or return clinic if symptoms are not improving.  Sinusitis Sinusitis is redness, soreness, and swelling (inflammation) of the paranasal sinuses. Paranasal sinuses are air pockets within the bones of your face (beneath the eyes, the middle of the forehead, or above the eyes). In healthy paranasal sinuses, mucus is able to drain out, and air is able to circulate through them by way of your nose. However, when your paranasal sinuses are inflamed, mucus and air can become trapped. This can allow bacteria and other germs to grow and cause infection. Sinusitis can develop quickly and last only a short time (acute) or continue over a long period (chronic). Sinusitis that lasts for more than 12 weeks is considered chronic.  CAUSES  Causes of sinusitis include: Allergies. Structural abnormalities, such as displacement of the cartilage that separates your nostrils (deviated  septum), which can decrease the air flow through your nose and sinuses and affect sinus drainage. Functional abnormalities, such as when the small hairs (cilia) that line your sinuses and help remove mucus do not work properly or are not present. SYMPTOMS  Symptoms of acute and chronic sinusitis are the same. The primary symptoms are pain and pressure around the affected sinuses. Other symptoms include: Upper toothache. Earache. Headache. Bad breath. Decreased sense of smell and  taste. A cough, which worsens when you are lying flat. Fatigue. Fever. Thick drainage from your nose, which often is green and may contain pus (purulent). Swelling and warmth over the affected sinuses. DIAGNOSIS  Your caregiver will perform a physical exam. During the exam, your caregiver may: Look in your nose for signs of abnormal growths in your nostrils (nasal polyps). Tap over the affected sinus to check for signs of infection. View the inside of your sinuses (endoscopy) with a special imaging device with a light attached (endoscope), which is inserted into your sinuses. If your caregiver suspects that you have chronic sinusitis, one or more of the following tests may be recommended: Allergy tests. Nasal culture A sample of mucus is taken from your nose and sent to a lab and screened for bacteria. Nasal cytology A sample of mucus is taken from your nose and examined by your caregiver to determine if your sinusitis is related to an allergy. TREATMENT  Most cases of acute sinusitis are related to a viral infection and will resolve on their own within 10 days. Sometimes medicines are prescribed to help relieve symptoms (pain medicine, decongestants, nasal steroid sprays, or saline sprays).  However, for sinusitis related to a bacterial infection, your caregiver will prescribe antibiotic medicines. These are medicines that will help kill the bacteria causing the infection.  Rarely, sinusitis is caused by a fungal infection. In theses cases, your caregiver will prescribe antifungal medicine. For some cases of chronic sinusitis, surgery is needed. Generally, these are cases in which sinusitis recurs more than 3 times per year, despite other treatments. HOME CARE INSTRUCTIONS  Drink plenty of water. Water helps thin the mucus so your sinuses can drain more easily. Use a humidifier. Inhale steam 3 to 4 times a day (for example, sit in the bathroom with the shower running). Apply a warm, moist  washcloth to your face 3 to 4 times a day, or as directed by your caregiver. Use saline nasal sprays to help moisten and clean your sinuses. Take over-the-counter or prescription medicines for pain, discomfort, or fever only as directed by your caregiver. SEEK IMMEDIATE MEDICAL CARE IF: You have increasing pain or severe headaches. You have nausea, vomiting, or drowsiness. You have swelling around your face. You have vision problems. You have a stiff neck. You have difficulty breathing. MAKE SURE YOU:  Understand these instructions. Will watch your condition. Will get help right away if you are not doing well or get worse. Document Released: 08/01/2005 Document Revised: 10/24/2011 Document Reviewed: 08/16/2011 [facility] Patient Information 2014 [place], [place].    If you have been instructed to have an in-person evaluation today at a local Urgent Care facility, please use the link below. It will take you to a list of all of our available [facility], including address, phone number and hours of operation. Please do not delay care.  [facility]  If you or a family member do not have a primary care provider, use the link below to schedule a visit and establish  care. When you choose a [facility] primary care physician or advanced practice provider, you gain a long-term partner in health. Find a Primary Care Provider  Learn more about [facility]'s in-office and virtual care options: [facility] - Get Care Now

## 2024-07-13 DIAGNOSIS — J028 Acute pharyngitis due to other specified organisms: Secondary | ICD-10-CM | POA: Diagnosis not present

## 2024-07-13 DIAGNOSIS — J060 Acute laryngopharyngitis: Secondary | ICD-10-CM | POA: Diagnosis not present

## 2024-07-13 DIAGNOSIS — R0981 Nasal congestion: Secondary | ICD-10-CM | POA: Diagnosis not present

## 2024-07-14 ENCOUNTER — Other Ambulatory Visit: Payer: Self-pay | Admitting: Obstetrics and Gynecology

## 2024-07-14 DIAGNOSIS — B009 Herpesviral infection, unspecified: Secondary | ICD-10-CM

## 2024-07-15 NOTE — Telephone Encounter (Signed)
 Med refill request: valtrex   Last AEX: 06/19/23  Next AEX: 01/01/25 Last MMG (if hormonal med) Refill authorized: Please Advise? Last rx 08/30/23 #100 with 2 refills

## 2024-07-16 ENCOUNTER — Encounter: Payer: Self-pay | Admitting: Obstetrics and Gynecology

## 2024-07-16 NOTE — Telephone Encounter (Signed)
 See open refill encounter dated 07/15/24.

## 2024-07-17 ENCOUNTER — Other Ambulatory Visit: Payer: Self-pay

## 2024-07-17 DIAGNOSIS — B009 Herpesviral infection, unspecified: Secondary | ICD-10-CM

## 2024-07-17 NOTE — Telephone Encounter (Signed)
 Med refill request:   valACYclovir  (VALTREX ) 500 MG tablet  Start:  07/16/24 Disp: 90  tablets Refills:  1  Already sent by Dr. Nikki yesterday.

## 2024-07-24 ENCOUNTER — Encounter: Payer: Self-pay | Admitting: Physician Assistant

## 2024-07-24 ENCOUNTER — Ambulatory Visit (INDEPENDENT_AMBULATORY_CARE_PROVIDER_SITE_OTHER): Admitting: Physician Assistant

## 2024-07-24 VITALS — BP 104/78 | HR 74 | Temp 98.0°F | Resp 18 | Ht 65.5 in | Wt 144.4 lb

## 2024-07-24 DIAGNOSIS — Z0000 Encounter for general adult medical examination without abnormal findings: Secondary | ICD-10-CM

## 2024-07-24 DIAGNOSIS — Z131 Encounter for screening for diabetes mellitus: Secondary | ICD-10-CM | POA: Diagnosis not present

## 2024-07-24 DIAGNOSIS — R3129 Other microscopic hematuria: Secondary | ICD-10-CM | POA: Diagnosis not present

## 2024-07-24 DIAGNOSIS — Z8639 Personal history of other endocrine, nutritional and metabolic disease: Secondary | ICD-10-CM | POA: Diagnosis not present

## 2024-07-24 LAB — POCT URINALYSIS DIP (CLINITEK)
Bilirubin, UA: NEGATIVE
Glucose, UA: NEGATIVE mg/dL
Ketones, POC UA: NEGATIVE mg/dL
Leukocytes, UA: NEGATIVE
Nitrite, UA: NEGATIVE
POC PROTEIN,UA: NEGATIVE
Spec Grav, UA: 1.015 (ref 1.010–1.025)
Urobilinogen, UA: 0.2 U/dL
pH, UA: 6 (ref 5.0–8.0)

## 2024-07-24 NOTE — Progress Notes (Signed)
 Subjective:  Patient ID: Ann Whitehead, female    DOB: June 01, 1981  Age: 43 y.o. MRN: [id number]  Chief Complaint  Patient presents with   Annual Exam    HPI Well Adult Physical: Patient here for a comprehensive physical exam.The patient reports no problems Do you take any herbs or supplements that were not prescribed by a doctor? no Are you taking calcium supplements? no Are you taking aspirin daily? no  Encounter for general adult medical examination without abnormal findings  Physical (At Risk items are starred): Patient's last physical exam was 1 year ago .  Patient is not afflicted from Stress Incontinence and Urge Incontinence  Patient wears a seat belts Patient has smoke detectors and has carbon monoxide detectors. Patient practices appropriate gun safety. Patient wears sunscreen with extended sun exposure. Dental Care: brushes and flosses daily. Last dental visit: is due Vision impairments: none Ophthalmology/Optometry: is due Hearing loss: none  Patient's last menstrual period was 07/12/2022. - hysterectomy Safe at home: Yes Self breast exams: Yes Last pap: 10/23 Last mammogram: is due but declines  Pt would like hgb A1c checked - family history of diabetes Pt has history of iron  def anemia - will check iron /ferritin levels    07/24/2024   10:36 AM 01/10/2023    9:30 AM 07/23/2021   10:19 AM 12/07/2020    2:27 PM  Depression screen PHQ 2/9  Decreased Interest 0 0 0 0  Down, Depressed, Hopeless 0 0 0 0  PHQ - 2 Score 0 0 0 0         09/17/2021   10:15 AM 06/14/2022    6:07 AM 07/26/2022   12:00 PM 01/10/2023    9:27 AM 07/24/2024   10:36 AM  Fall Risk  Falls in the past year?    0 0  Was there an injury with Fall?    0  0  Fall Risk Category Calculator    0 0  (RETIRED) Patient Fall Risk Level Low fall risk  Low fall risk  Low fall risk     Patient at Risk for Falls Due to    No Fall Risks No Fall Risks  Fall risk Follow up    Falls evaluation completed  Falls evaluation completed     Data saved with a previous flowsheet row definition             Social Hx   Social History   Socioeconomic History   Marital status: Married    Spouse name: SCOTT   Number of children: 1   Years of education: 12 + 2   Highest education level: Tax Adviser degree: occupational, scientist, product/process development, or vocational program  Occupational History   Occupation: [employer]  Tobacco Use   Smoking status: Former    Current packs/day: 0.00    Types: Cigarettes    Quit date: 11/15/2021    Years since quitting: 2.6   Smokeless tobacco: Never   Tobacco comments:    smokes 5 cigs/week  Vaping Use   Vaping status: Never Used  Substance and Sexual Activity   Alcohol use: Not Currently   Drug use: No   Sexual activity: Yes    Partners: Male    Birth control/protection: Surgical    Comment: Tubal  Other Topics Concern   Not on file  Social History Narrative   Not on file   Social Drivers of Health   Financial Resource Strain: Low Risk  (07/19/2024)   Overall Financial Resource Strain (CARDIA)  Difficulty of Paying Living Expenses: Not hard at all  Food Insecurity: No Food Insecurity (07/19/2024)   Hunger Vital Sign    Worried About Running Out of Food in the Last Year: Never true    Ran Out of Food in the Last Year: Never true  Transportation Needs: No Transportation Needs (07/19/2024)   PRAPARE - Administrator, Civil Service (Medical): No    Lack of Transportation (Non-Medical): No  Physical Activity: Insufficiently Active (07/19/2024)   Exercise Vital Sign    Days of Exercise per Week: 3 days    Minutes of Exercise per Session: 20 min  Stress: No Stress Concern Present (07/19/2024)   [employer] of Occupational Health - Occupational Stress Questionnaire    Feeling of Stress: Not at all  Social Connections: Moderately Integrated (07/19/2024)   Social Connection and Isolation Panel    Frequency of Communication with Friends and  Family: More than three times a week    Frequency of Social Gatherings with Friends and Family: Once a week    Attends Religious Services: 1 to 4 times per year    Active Member of [employer] or Organizations: No    Attends Banker Meetings: Not on file    Marital Status: Married   Past Medical History:  Diagnosis Date   Abnormal uterine bleeding (AUB)    Acid reflux disease    Acute hearing loss    Right   Anemia    COVID    March 2023 h/a and fever and nasal congestion. 06/03/2022   Dizziness    Elevated AST (SGOT) 10/2016   Endometriosis    Left ovarian cyst    Low ferritin    Migraines    without aura   STD (sexually transmitted disease)    HX HSV   Tinnitus, right ear    Past Surgical History:  Procedure Laterality Date   ABLATION ON ENDOMETRIOSIS N/A 07/21/2015   Procedure: FULGERATION OF ENDOMETRIOSIS, BIOPSY OF POSTERIOR CUL DE SAC;  Surgeon: Bobie FORBES Cathlyn JAYSON Nikki, MD;  Location: [facility];  Service: Gynecology;  Laterality: N/A;   APPENDECTOMY  2004   AUGMENTATION MAMMAPLASTY  2011   CESAREAN SECTION  2003   CHOLECYSTECTOMY  2004   CYSTOSCOPY N/A 06/14/2022   Procedure: CYSTOSCOPY;  Surgeon: Cathlyn JAYSON Nikki Bobie FORBES, MD;  Location: [facility];  Service: Gynecology;  Laterality: N/A;   LAPAROSCOPIC TUBAL LIGATION Right 07/21/2015   Procedure: LAPAROSCOPIC TUBAL LIGATION;  Surgeon: Bobie FORBES Cathlyn JAYSON Nikki, MD;  Location: [facility];  Service: Gynecology;  Laterality: Right;   OVARIAN CYST REMOVAL Left 07/21/2015   Procedure: EXCISION PARA-OVARIAN CYST;  Surgeon: Bobie FORBES Cathlyn JAYSON Nikki, MD;  Location: [facility];  Service: Gynecology;  Laterality: Left;   TOTAL LAPAROSCOPIC HYSTERECTOMY WITH SALPINGECTOMY N/A 06/14/2022   Procedure: TOTAL LAPAROSCOPIC HYSTERECTOMY WITH right SALPINGECTOMY; FULGERATION OF ENDOMETRIOSIS;  Surgeon: Cathlyn JAYSON Nikki Bobie FORBES, MD;  Location: [facility];  Service: Gynecology;  Laterality: N/A;   UNILATERAL  SALPINGECTOMY Left 07/21/2015   Procedure: DISTAL UNILATERAL SALPINGECTOMY;  Surgeon: Bobie FORBES Cathlyn JAYSON Nikki, MD;  Location: [facility];  Service: Gynecology;  Laterality: Left;    Family History  Problem Relation Age of Onset   Colon polyps Mother        multiple pre-cancerous polyps on several colonoscopies   Diabetes Father    Hypertension Father    Hyperlipidemia Father    Diabetes Paternal Grandmother  Hyperlipidemia Paternal Grandmother    Hypertension Paternal Grandmother     ROS CONSTITUTIONAL: Negative for chills, fatigue, fever, unintentional weight gain and unintentional weight loss.  E/N/T: Negative for ear pain, nasal congestion and sore throat.  CARDIOVASCULAR: Negative for chest pain, dizziness, palpitations and pedal edema.  RESPIRATORY: Negative for recent cough and dyspnea.  GASTROINTESTINAL: Negative for abdominal pain, acid reflux symptoms, constipation, diarrhea, nausea and vomiting.  MSK: Negative for arthralgias and myalgias.  INTEGUMENTARY: Negative for rash.  NEUROLOGICAL: Negative for dizziness and headaches.  PSYCHIATRIC: Negative for sleep disturbance and to question depression screen.  Negative for depression, negative for anhedonia.   Objective:  PHYSICAL EXAM:   BP 104/78   Pulse 74   Temp 98 F (36.7 C) (Temporal)   Resp 18   Ht 5' 5.5 (1.664 m)   Wt 144 lb 6.4 oz (65.5 kg)   LMP 07/12/2022   SpO2 99%   BMI 23.66 kg/m    GEN: Well nourished, well developed, in no acute distress  HEENT: normal external ears and nose - normal external auditory canals and TMS - hearing grossly normal - normal nasal mucosa and septum - Lips, Teeth and Gums - normal  Oropharynx - normal mucosa, palate, and posterior pharynx Neck: no JVD or masses - no thyromegaly Cardiac: RRR; no murmurs, rubs, or gallops,no edema - no significant varicosities Respiratory:  normal respiratory rate and pattern with no distress - normal breath sounds with no rales, rhonchi,  wheezes or rubs GI: normal bowel sounds, no masses or tenderness MS: no deformity or atrophy  Skin: warm and dry, no rash  Neuro:  Alert and Oriented x 3, Strength and sensation are intact - CN II-Xii grossly intact Psych: euthymic mood, appropriate affect and demeanor  Office Visit on 07/24/2024  Component Date Value Ref Range Status   Color, UA 07/24/2024 yellow  yellow Final   Clarity, UA 07/24/2024 clear  clear Final   Glucose, UA 07/24/2024 negative  negative mg/dL Final   Bilirubin, UA 87/89/7974 negative  negative Final   Ketones, POC UA 07/24/2024 negative  negative mg/dL Final   Spec Grav, UA 87/89/7974 1.015  1.010 - 1.025 Final   Blood, UA 07/24/2024 small (A)  negative Final   pH, UA 07/24/2024 6.0  5.0 - 8.0 Final   POC PROTEIN,UA 07/24/2024 negative  negative, trace Final   Urobilinogen, UA 07/24/2024 0.2  0.2 or 1.0 E.U./dL Final   Nitrite, UA 87/89/7974 Negative  Negative Final   Leukocytes, UA 07/24/2024 Negative  Negative Final    Assessment & Plan:  Annual physical exam -     POCT URINALYSIS DIP (CLINITEK) -     Comprehensive metabolic panel with GFR -     TSH -     Lipid panel -     Hemoglobin A1c -     Fe+CBC/D/Plt+TIBC+Fer+Retic  History of iron  deficiency -     Fe+CBC/D/Plt+TIBC+Fer+Retic  Diabetes mellitus screening -     Hemoglobin A1c  Microscopic hematuria -     Fe+CBC/D/Plt+TIBC+Fer+Retic   Education given Recommend continue to work on eating healthy diet and exercise.  This is a list of the screening recommended for you and due dates:  Health Maintenance  Topic Date Due   Breast Cancer Screening  11/01/2024*   Hepatitis B Vaccine (1 of 3 - 19+ 3-dose series) 07/24/2025*   HPV Vaccine (1 - 3-dose SCDM series) 07/24/2025*   DTaP/Tdap/Td vaccine (2 - Td or Tdap) 10/10/2024   Flu Shot  Completed   Hepatitis C Screening  Completed   HIV Screening  Completed   Pneumococcal Vaccine  Aged Out   Meningitis B Vaccine  Aged Out   COVID-19  Vaccine  Discontinued  *Topic was postponed. The date shown is not the original due date.     Follow-up: Return in about 1 year (around 07/24/2025) for fasting physical -- recheck ua in 1 month nurse visit.  An After Visit Summary was printed and given to the patient.  CAMIE JONELLE NICHOLAUS DEVONNA [facility] [phone]

## 2024-07-25 ENCOUNTER — Ambulatory Visit: Payer: Self-pay | Admitting: Physician Assistant

## 2024-07-25 ENCOUNTER — Encounter: Admitting: Physician Assistant

## 2024-07-25 LAB — FE+CBC/D/PLT+TIBC+FER+RETIC
Basophils Absolute: 0.1 x10E3/uL (ref 0.0–0.2)
Basos: 1 %
EOS (ABSOLUTE): 0.2 x10E3/uL (ref 0.0–0.4)
Eos: 3 %
Ferritin: 74 ng/mL (ref 15–150)
Hematocrit: 41.8 % (ref 34.0–46.6)
Hemoglobin: 13.6 g/dL (ref 11.1–15.9)
Immature Grans (Abs): 0 x10E3/uL (ref 0.0–0.1)
Immature Granulocytes: 0 %
Iron Saturation: 52 % (ref 15–55)
Iron: 165 ug/dL — ABNORMAL HIGH (ref 27–159)
Lymphocytes Absolute: 2.6 x10E3/uL (ref 0.7–3.1)
Lymphs: 33 %
MCH: 32.2 pg (ref 26.6–33.0)
MCHC: 32.5 g/dL (ref 31.5–35.7)
MCV: 99 fL — ABNORMAL HIGH (ref 79–97)
Monocytes Absolute: 0.8 x10E3/uL (ref 0.1–0.9)
Monocytes: 10 %
Neutrophils Absolute: 4.1 x10E3/uL (ref 1.4–7.0)
Neutrophils: 53 %
Platelets: 355 x10E3/uL (ref 150–450)
RBC: 4.22 x10E6/uL (ref 3.77–5.28)
RDW: 11.8 % (ref 11.7–15.4)
Retic Ct Pct: 1.2 % (ref 0.6–2.6)
Total Iron Binding Capacity: 319 ug/dL (ref 250–450)
UIBC: 154 ug/dL (ref 131–425)
WBC: 7.8 x10E3/uL (ref 3.4–10.8)

## 2024-07-25 LAB — COMPREHENSIVE METABOLIC PANEL WITH GFR
ALT: 15 IU/L (ref 0–32)
AST: 19 IU/L (ref 0–40)
Albumin: 4.7 g/dL (ref 3.9–4.9)
Alkaline Phosphatase: 42 IU/L (ref 41–116)
BUN/Creatinine Ratio: 17 (ref 9–23)
BUN: 13 mg/dL (ref 6–24)
Bilirubin Total: 0.3 mg/dL (ref 0.0–1.2)
CO2: 20 mmol/L (ref 20–29)
Calcium: 9.7 mg/dL (ref 8.7–10.2)
Chloride: 101 mmol/L (ref 96–106)
Creatinine, Ser: 0.76 mg/dL (ref 0.57–1.00)
Globulin, Total: 2.4 g/dL (ref 1.5–4.5)
Glucose: 83 mg/dL (ref 70–99)
Potassium: 4.5 mmol/L (ref 3.5–5.2)
Sodium: 137 mmol/L (ref 134–144)
Total Protein: 7.1 g/dL (ref 6.0–8.5)
eGFR: 101 mL/min/1.73 (ref >59)

## 2024-07-25 LAB — LIPID PANEL
Chol/HDL Ratio: 3.3 ratio (ref 0.0–4.4)
Cholesterol, Total: 199 mg/dL (ref 100–199)
HDL: 60 mg/dL (ref >39)
LDL Chol Calc (NIH): 128 mg/dL — ABNORMAL HIGH (ref 0–99)
Triglycerides: 59 mg/dL (ref 0–149)
VLDL Cholesterol Cal: 11 mg/dL (ref 5–40)

## 2024-07-25 LAB — HEMOGLOBIN A1C
Est. average glucose Bld gHb Est-mCnc: 114 mg/dL
Hgb A1c MFr Bld: 5.6 % (ref 4.8–5.6)

## 2024-07-25 LAB — TSH: TSH: 2.1 u[IU]/mL (ref 0.450–4.500)

## 2024-08-16 ENCOUNTER — Encounter: Payer: Self-pay | Admitting: Physician Assistant

## 2024-08-16 ENCOUNTER — Ambulatory Visit

## 2024-08-16 ENCOUNTER — Other Ambulatory Visit: Payer: Self-pay

## 2024-08-16 DIAGNOSIS — R899 Unspecified abnormal finding in specimens from other organs, systems and tissues: Secondary | ICD-10-CM

## 2024-08-16 DIAGNOSIS — R3129 Other microscopic hematuria: Secondary | ICD-10-CM

## 2024-08-16 NOTE — Progress Notes (Signed)
 Patient is in office today for a nurse visit for Repeat UA. Patient's UA was positive for blood (+- 10 Ery/uL).   Patient left after leaving a sample.    Sending to PCP at this time.

## 2024-08-17 LAB — UA/M W/RFLX CULTURE, ROUTINE
Bilirubin, UA: NEGATIVE
Glucose, UA: NEGATIVE
Ketones, UA: NEGATIVE
Leukocytes,UA: NEGATIVE
Nitrite, UA: NEGATIVE
RBC, UA: NEGATIVE
Specific Gravity, UA: 1.025 (ref 1.005–1.030)
Urobilinogen, Ur: 0.2 mg/dL (ref 0.2–1.0)
pH, UA: 6 (ref 5.0–7.5)

## 2024-08-17 LAB — MICROSCOPIC EXAMINATION
Bacteria, UA: NONE SEEN
Casts: NONE SEEN /LPF
WBC, UA: NONE SEEN /HPF (ref 0–5)

## 2024-08-19 ENCOUNTER — Ambulatory Visit: Payer: Self-pay | Admitting: Physician Assistant

## 2024-08-19 LAB — POCT URINALYSIS DIP (CLINITEK)
Bilirubin, UA: NEGATIVE
Glucose, UA: NEGATIVE mg/dL
Ketones, POC UA: NEGATIVE mg/dL
Leukocytes, UA: NEGATIVE
Nitrite, UA: NEGATIVE
POC PROTEIN,UA: NEGATIVE
Spec Grav, UA: 1.02
Urobilinogen, UA: 0.2 U/dL
pH, UA: 6

## 2024-08-19 NOTE — Addendum Note (Signed)
 Addended by: TWILLA DODRILL C on: 08/19/2024 11:50 AM   Modules accepted: Orders

## 2024-08-27 ENCOUNTER — Ambulatory Visit

## 2025-01-01 ENCOUNTER — Ambulatory Visit: Admitting: Obstetrics and Gynecology

## 2025-08-01 ENCOUNTER — Encounter: Admitting: Physician Assistant
